# Patient Record
Sex: Male | Born: 1950 | Race: White | Hispanic: No | Marital: Married | Smoking: Never smoker
Health system: Southern US, Community
[De-identification: ages and names within clinical notes are randomized; demographics above are authoritative.]

## PROBLEM LIST (undated history)

## (undated) DIAGNOSIS — N400 Enlarged prostate without lower urinary tract symptoms: Secondary | ICD-10-CM

## (undated) HISTORY — DX: Benign prostatic hyperplasia without lower urinary tract symptoms: N40.0

---

## 2003-11-11 ENCOUNTER — Inpatient Hospital Stay (HOSPITAL_COMMUNITY): Admission: RE | Admit: 2003-11-11 | Discharge: 2003-11-13 | Payer: Self-pay | Admitting: Orthopedic Surgery

## 2003-11-23 HISTORY — PX: EYE SURGERY: SHX253

## 2005-11-05 ENCOUNTER — Ambulatory Visit: Payer: Self-pay | Admitting: Family Medicine

## 2005-11-12 ENCOUNTER — Ambulatory Visit: Payer: Self-pay | Admitting: Family Medicine

## 2005-11-16 ENCOUNTER — Ambulatory Visit: Payer: Self-pay | Admitting: Gastroenterology

## 2005-11-22 HISTORY — PX: JOINT REPLACEMENT: SHX530

## 2005-11-24 ENCOUNTER — Ambulatory Visit: Payer: Self-pay | Admitting: Gastroenterology

## 2005-11-24 HISTORY — PX: COLONOSCOPY: SHX174

## 2009-07-15 ENCOUNTER — Ambulatory Visit: Payer: Self-pay | Admitting: Family Medicine

## 2009-07-15 DIAGNOSIS — M1990 Unspecified osteoarthritis, unspecified site: Secondary | ICD-10-CM

## 2009-07-18 LAB — CONVERTED CEMR LAB
ALT: 31 units/L (ref 0–53)
AST: 32 units/L (ref 0–37)
Albumin: 4.5 g/dL (ref 3.5–5.2)
Alkaline Phosphatase: 58 units/L (ref 39–117)
BUN: 19 mg/dL (ref 6–23)
Basophils Absolute: 0 10*3/uL (ref 0.0–0.1)
Basophils Relative: 0.6 % (ref 0.0–3.0)
Bilirubin, Direct: 0 mg/dL (ref 0.0–0.3)
CO2: 31 meq/L (ref 19–32)
Calcium: 9.6 mg/dL (ref 8.4–10.5)
Chloride: 108 meq/L (ref 96–112)
Cholesterol: 210 mg/dL — ABNORMAL HIGH (ref 0–200)
Creatinine, Ser: 1.1 mg/dL (ref 0.4–1.5)
Direct LDL: 151.3 mg/dL
Eosinophils Absolute: 0 10*3/uL (ref 0.0–0.7)
Eosinophils Relative: 0.7 % (ref 0.0–5.0)
GFR calc non Af Amer: 72.93 mL/min (ref >60)
Glucose, Bld: 72 mg/dL (ref 70–99)
HCT: 43.6 % (ref 39.0–52.0)
HDL: 44.3 mg/dL (ref >39.00)
Hemoglobin: 15.5 g/dL (ref 13.0–17.0)
Lymphocytes Relative: 32.5 % (ref 12.0–46.0)
Lymphs Abs: 1.9 10*3/uL (ref 0.7–4.0)
MCHC: 35.5 g/dL (ref 30.0–36.0)
MCV: 95 fL (ref 78.0–100.0)
Monocytes Absolute: 0.4 10*3/uL (ref 0.1–1.0)
Monocytes Relative: 6.6 % (ref 3.0–12.0)
Neutro Abs: 3.7 10*3/uL (ref 1.4–7.7)
Neutrophils Relative %: 59.6 % (ref 43.0–77.0)
PSA: 3.36 ng/mL (ref 0.10–4.00)
Platelets: 218 10*3/uL (ref 150.0–400.0)
Potassium: 4.5 meq/L (ref 3.5–5.1)
RBC: 4.59 M/uL (ref 4.22–5.81)
RDW: 12.3 % (ref 11.5–14.6)
Sodium: 143 meq/L (ref 135–145)
TSH: 1.33 microintl units/mL (ref 0.35–5.50)
Total Bilirubin: 1 mg/dL (ref 0.3–1.2)
Total CHOL/HDL Ratio: 5
Total Protein: 7.3 g/dL (ref 6.0–8.3)
Triglycerides: 108 mg/dL (ref 0.0–149.0)
VLDL: 21.6 mg/dL (ref 0.0–40.0)
WBC: 6 10*3/uL (ref 4.5–10.5)

## 2010-07-20 ENCOUNTER — Encounter: Payer: Self-pay | Admitting: Family Medicine

## 2010-12-22 NOTE — Miscellaneous (Signed)
Summary: flu vacc  Clinical Lists Changes  Observations: Added new observation of FLU VAX: Fluvax 3+ (07/19/2010 12:12)      Immunization History:  Influenza Immunization History:    Influenza:  fluvax 3+ (07/19/2010)

## 2010-12-22 NOTE — Assessment & Plan Note (Signed)
Summary: NEW TO RE ESTABLISH/[facility]   Vital Signs:  Patient profile:   60 year old male Height:      69.50 inches Weight:      209 pounds BMI:     30.53 Temp:     97.8 degrees F oral Pulse rate:   80 / minute Pulse rhythm:   regular Resp:     12 per minute BP sitting:   110 / 78  (left arm) Cuff size:   large  Vitals Entered By: Sid Falcon LPN (July 15, 2009 10:28 AM) CC: New to establish,    History of Present Illness: Patient is seen to reestablish care here after not being seen approximately 4 years for complete physical. Past medical history reviewed had prior history of left hip replacement due to osteoarthritis. Followed by orthopedist. No other chronic medical problems. No other prior surgeries. He reports colonoscopy around 2006 which was normal. Date of last tetanus from 2000. He exercises fairly regularly.  Family history reviewed and significant for father having MI in his 30s. He had a brother that died of what sounds like complications of stomach cancer. No known colon cancer or prostate cancer history.  Allergies (verified): No Known Drug Allergies  Past History:  Past Medical History: Meningitis - as a child Osteoarthritis  Past Surgical History: Colonoscopy Hip replacement, left 2006 Queen Blossom, MD  Family History: Family History of Parkinson's Family History of Cardiovascular disorder father MI 52s  Review of Systems  The patient denies anorexia, fever, weight loss, weight gain, vision loss, decreased hearing, chest pain, syncope, dyspnea on exertion, peripheral edema, prolonged cough, headaches, hemoptysis, abdominal pain, melena, hematochezia, severe indigestion/heartburn, hematuria, incontinence, muscle weakness, suspicious skin lesions, depression, enlarged lymph nodes, and testicular masses.    Physical Exam  General:  Well-developed,well-nourished,in no acute distress; alert,appropriate and cooperative throughout examination Head:   Normocephalic and atraumatic without obvious abnormalities. No apparent alopecia or balding. Eyes:  No corneal or conjunctival inflammation noted. EOMI. Perrla. Funduscopic exam benign, without hemorrhages, exudates or papilledema. Vision grossly normal. Ears:  External ear exam shows no significant lesions or deformities.  Otoscopic examination reveals clear canals, tympanic membranes are intact bilaterally without bulging, retraction, inflammation or discharge. Hearing is grossly normal bilaterally. Mouth:  Oral mucosa and oropharynx without lesions or exudates.  Teeth in good repair. Neck:  No deformities, masses, or tenderness noted. Lungs:  Normal respiratory effort, chest expands symmetrically. Lungs are clear to auscultation, no crackles or wheezes. Heart:  Normal rate and regular rhythm. S1 and S2 normal without gallop, murmur, click, rub or other extra sounds. Abdomen:  Bowel sounds positive,abdomen soft and non-tender without masses, organomegaly or hernias noted. Rectal:  No external abnormalities noted. Normal sphincter tone. No rectal masses or tenderness. Prostate:  Prostate gland firm and smooth, no enlargement, nodularity, tenderness, mass, asymmetry or induration. Extremities:  No clubbing, cyanosis, edema, or deformity noted with normal full range of motion of all joints.   Neurologic:  No cranial nerve deficits noted. Station and gait are normal. Plantar reflexes are down-going bilaterally. DTRs are symmetrical throughout. Sensory, motor and coordinative functions appear intact. Skin:  Intact without suspicious lesions or rashes Cervical Nodes:  No lymphadenopathy noted Psych:  Cognition and judgment appear intact. Alert and cooperative with normal attention span and concentration. No apparent delusions, illusions, hallucinations   Impression & Recommendations:  Problem # 1:  Preventive Health Care (ICD-V70.0) Obtain screening labs. Tetanus booster will be given. Colonoscopy  obtained recently and reportedly normal.  Continue regular exercise.  Complete Medication List: 1)  No Medications   Other Orders: TLB-Lipid Panel (80061-LIPID) TLB-BMP (Basic Metabolic Panel-BMET) (80048-METABOL) TLB-CBC Platelet - w/Differential (85025-CBCD) TLB-Hepatic/Liver Function Pnl (80076-HEPATIC) TLB-TSH (Thyroid Stimulating Hormone) (84443-TSH) TLB-PSA (Prostate Specific Antigen) (84153-PSA) Tdap => 80yrs IM (56433) Admin 1st Vaccine (29518)  Patient Instructions: 1)  It is important that you exercise reguarly at least 20 minutes 5 times a week. If you develop chest pain, have severe difficulty breathing, or feel very tired, stop exercising immediately and seek medical attention.   Preventive Care Screening  Colonoscopy:    Date:  11/22/2004    Results:  normal   Last Tetanus Booster:    Date:  11/22/1998    Results:  Historical     Immunizations Administered:  Tetanus Vaccine:    Vaccine Type: Tdap    Site: left deltoid    Mfr: GlaxoSmithKline    Dose: 0.5 ml    Route: IM    Given by: Sid Falcon LPN    Exp. Date: 09/24/2009    Lot #: [id number]

## 2011-04-09 NOTE — Discharge Summary (Signed)
Troy [name]                             ACCOUNT NO.:  [id number]   MEDICAL RECORD NO.:  [id number]                   PATIENT TYPE:  INP   LOCATION:  5037                                 FACILITY:  [facility]   PHYSICIAN:  Mila Homer. Troy [name], M.D.              DATE OF BIRTH:  1951-10-21   DATE OF ADMISSION:  11/11/2003  DATE OF DISCHARGE:  11/13/2003                                 DISCHARGE SUMMARY   ADMITTING DIAGNOSIS:  Pain and osteoarthritis of the left hip.   DISCHARGE DIAGNOSIS:  Status post left total hip arthroplasty.   HISTORY OF PRESENT ILLNESS:  Troy [name] is a 60 year old white male with a  history of left hip pain for several years.  This pain has progressively  gotten worse with time with increased stiffness.  He indicates that his pain  is located deep within the groin.  This is a constant, dull, burning  sensation with occasional sharp, shooting pain with increased activity.  The  patient has developed a significant limp in the left hip due to the  stiffness and pain.  He has difficulty bending over and tying his shoes.  He  does have occasional radiation of the pain down to his left knee.  He denies  any mechanical symptoms or injury to the hip.   ALLERGIES:  No known drug allergies.   CURRENT MEDICATIONS:  Celebrex 200 mg p.o. q. day.   SURGICAL PROCEDURE:  The patient was taken to the operating room by Dr.  Mila Homer. Lucey and assisted by Jamelle Rushing, P.A on November 11, 2003.  The patient was placed under general anesthesia and a left total hip  arthroplasty was performed.  A 7 mm off set polyethylene with no neutral and  no liners was placed and 32 mm head, 13 mm fully porous coated stem and a 0  sized 32 mm cobalt-chrome head were used.  The patient tolerated the  procedure well and returned to recovery in good condition.   CONSULTS:  The following consult was obtained while the patient was  hospitalized:  PT/OT case management.   HOSPITAL COURSE:  The  patient remained afebrile and his vital signs remained  stable throughout his hospital course.  The patient progressed very well  with physical therapy.  The patient did develop acute blood loss anemia  secondary to surgery, however, he remained asymptomatic and did not require  a blood transfusion.  The patient was discharged home postoperative day two  status post left total hip arthroplasty in good stable condition.   LABORATORY DATA:  The following labs were obtained preoperatively:  CBC;  white blood count 8.3, hemoglobin 15.3, hematocrit 44.3, platelets 246.  Coags; PT 12.2, INR 0.9, PTT 25.  Routine chemistries; sodium 140, potassium  4.6, chloride 104, bicarbonate 29, glucose 90, BUN 26, creatinine 1.1.  Hepatic enzymes; AST 31, ALT 43, _______ 67, total bilirubin  0.4.  Urinalysis was negative.   AP pelvis of the left hip postoperatively showed good position and alignment  of the acetabular femoral prosthetic components.   DISCHARGE INSTRUCTIONS:  The patient was to add the following medications:  1. Lovenox 30 mg one injection daily for a total of 14 days after surgery.  2. OxyContin 10 mg SR one tablet every 12 hours as needed for pain.  3. Percocet 5 mg one to two tablets every 4 to 6 hours p.r.n. pain.   The patient was to discontinue the use of Celebrex.   ACTIVITY:  The patient will be weightbearing as tolerated with a walker.  Home health and physical therapy with [facility].   DIET:  No restrictions.   CONDITION ON DISCHARGE:  The patient was discharged home in good, stable  condition.   FOLLOW UP:  The patient needs to follow-up with Dr. Sherlean [name] in his office 12  days after discharge.  The patient is to call the office at [phone] for an  appointment.   WOUND CARE:  The patient is to keep his wound clean and dry and change  dressing daily.  He may shower after two days if no drainage from the wound.  The patient is to call the office for  temperatures greater than 100.5,  vomiting, chills, swelling, smelly drainage or pain that is not controlled  with pain medications.   SPECIAL INSTRUCTIONS:  Abduction pillow while sleeping.      Troy [name], P.A.                       Mila Homer. Troy [name], M.D.    GC/MEDQ  D:  12/23/2003  T:  12/24/2003  Job:  [id number]

## 2011-04-09 NOTE — Op Note (Signed)
Troy [name], Troy [name]                             ACCOUNT NO.:  [id number]   MEDICAL RECORD NO.:  [id number]                   PATIENT TYPE:  INP   LOCATION:  2870                                 FACILITY:  [facility]   PHYSICIAN:  Mila Homer. Sherlean Foot, M.D.              DATE OF BIRTH:  Jul 06, 1951   DATE OF PROCEDURE:  11/11/2003  DATE OF DISCHARGE:                                 OPERATIVE REPORT   PREOPERATIVE DIAGNOSIS:  Left hip osteoarthritis.   POSTOPERATIVE DIAGNOSIS:  Left hip osteoarthritis.   OPERATION PERFORMED:  Left total hip arthroplasty.   SURGEON:  Mila Homer. Sherlean Foot, M.D.   ASSISTANT:  Jamelle Rushing, P.A.   ANESTHESIA:  General.   INDICATIONS FOR PROCEDURE:  The patient is a 60 year old white male with  failure of conservative measures for osteoarthritis of the hip.  Informed  consent was obtained.   DESCRIPTION OF PROCEDURE:  The patient was laid supine on the operating  table and administered general anesthesia.  He was then placed in right down  left up lateral decubitus position.  The left hip was then prepped and  draped in the usual sterile fashion.  A minimally invasive incision was used  and made approximately five inches and length.  I used a clean 10 blade to  go down to the fascia and through the fascia.  I used a cautery to cauterize  all bleeding vessels.  I then put the Charnley retractor in place to expose  the gluteus medius and vastus lateralis. I  then elevated a single sleeve of  tissue in the anterior half of the lateralis and the anterior two thirds of  the gluteus medius and all the gluteus minimus.  I then did an anterior hip  capsulectomy and dislocated the hip.  I then used our cutting guide template  to mark out a neck cut and used a sagittal saw to make the cut.  I then went  into the flexed external rotated position with the leg in sterile pouch and  put a Hohmann retractor anterior and posterior to the acetabulum.  I then  removed the labrum  and the ligamentum teres.  I then switched sides of the  table with my physician assistant.  I then reamed sequentially up to 58 mm  and tamped in a 5 mm cup with no holes and no spikes and had excellent  stability.  This was in approximately 35 degrees of abduction and 10 degrees  of anteversion.  At this point I switched back to the posterior side of the  table.  We then gained access to the femoral canal with a canal finder.  We  had  Meyerding retractor placed to protect the abductors.  I then used the  side-biting reamer and reamed laterally into the trochanter.  I then reamed  sequentially in 0.5 mm increments up to 13 mm and then used  broaches up to  13.  I had excellent rotational stability and then I trialed with a 0 head,  7 mm offset liner and had excellent stability.  I then removed the trial  components, tamped in a real 7 mm offset polyethylene with neutral and no  liners that accepted a 32 mm head.  I then irrigated and tamped down  13 mm  fully porous coated stem and tamped on a 0 sized, 32 mm cobalt chrome head  onto a clean Morse taper.  I then located the hip and took it through  extreme range of motion and it was very, very stable.  Offset and limb  length was excellent.  I then closed the abductors to drill holes on the  trochanter, oversewed the interval with about six interrupted figure-of-  eight #2 Ethibond sutures and then irrigated again and closed  the fascia  lata with interrupted #1 Vicryl.  The deep soft tissues with interrupted 0  Vicryl and then subcuticular 2-0 Vicryl and skin staples.   COMPLICATIONS:  None.   DRAINS:  One pain catheter.   ESTIMATED BLOOD LOSS:  300 mL.                                               Mila Homer. Sherlean Foot, M.D.    SDL/MEDQ  D:  11/11/2003  T:  11/11/2003  Job:  [id number]

## 2011-04-09 NOTE — H&P (Signed)
NAME:  Troy [name], Troy [name]                             ACCOUNT NO.:  [id number]   MEDICAL RECORD NO.:  [id number]                   PATIENT TYPE:  INP   LOCATION:  NA                                   FACILITY:  [facility]   PHYSICIAN:  Mila Homer. Sherlean Foot, M.D.              DATE OF BIRTH:  [birth year]   DATE OF ADMISSION:  11/11/2003  DATE OF DISCHARGE:                                HISTORY & PHYSICAL   CHIEF COMPLAINT:  Left hip pain going on for several years.   HISTORY OF PRESENT ILLNESS:  The patient is a 60 year old white male with a  several year history of progressively worsening left hip pain and increased  stiffness.  The patient indicates that he has pain directly related deep  within the groin.  It is a constant dull, burning sensation with occasional  sharp shooting pains with increased activity.  He states that he has had  progressively worsening stiffness and a significant limp.  He has difficulty  bending over and tying his shoes.  He does have pain occasionally radiating  down to the knee.  He denies any mechanical symptoms or injury to the hip.   X-rays reveal a significant increased sclerotic femoral head with cystic  changes and irregular borders.  It is bone on bone along the lateral aspect  of the acetabulum, which is also cystic and increased sclerotic margins with  large spurs on the femoral head.   ALLERGIES:  No known drug allergies.   CURRENT MEDICATIONS:  Celebrex 200 mg p.o. daily.   PAST MEDICAL HISTORY:  The patient denies any significant medical issues.   PAST SURGICAL HISTORY:  The patient denies any previous surgery.   SOCIAL HISTORY:  The patient is a 60 year old white male healthy appearing  well-developed. He denies any history of smoking.  He does drink a couple of  beers a day.  He is currently married.  He lives with his wife in a one-  story house.  He is a retired Geneticist, molecular.   FAMILY PHYSICIAN:  Dr. Gershon Crane at [phone].   FAMILY MEDICAL  HISTORY:  Mother is deceased from cardiac arrest.  Father is  deceased from heart failure.  The patient has one brother deceased from lung  cancer.  One brother and one sister are both alive and in good medical  health.   REVIEW OF SYSTEMS:  Completely negative and noncontributory in all  categories including sensory, respiratory, cardiac, GI, GU, hematologic,  musculoskeletal, neurologic, and no mental status issues at this time.   PHYSICAL EXAMINATION:  VITAL SIGNS:  Height is 5 feet 10 inches, weight is  195 pounds.  Pulse of 76 and regular, respirations 12, blood pressure is  122/72.  The patient is afebrile.  GENERAL:  This is a healthy-appearing, well-developed physically fit  appearing white male.  He does ambulate with a slight left-sided limp.  He  is able to walk briskly.  He is able to get himself on and off the  examination table without any difficulty.  HEENT:  Head was normocephalic.  Pupils equal, round, and reactive to light  and accommodation.  Extraocular movements intact.  Sclerae is nonicteric.  Conjunctivae are pink and moist.  External ears are without deformities.  TMs pearly gray and intact.  Hearing is intact.  Nasal septum was midline.  Oral buccal mucosa was pink and moist without lesions.  Dentition was in  fair repair.  The patient is able to swallow without any difficulty.  NECK:  Supple.  No palpable lymphadenopathy.  Thyroid region was nontender.  He had excellent range of motion or his cervical spine without any  difficulties or tenderness.  He was nontender to percussion along the entire  spinal column.  CHEST:  Lung sounds were clear and equal bilaterally.  No wheezes, rales,  rhonchi, or rubs noted.  HEART:  Regular rate and rhythm.  S1 and S2 was auscultated.  No murmurs,  rubs, or gallops noted.  ABDOMEN:  Soft, nontender.  Bowel sounds were normoactive throughout.  No  hepatosplenomegaly.  CVA region was nontender.  EXTREMITIES:  Upper  extremities were symmetric in size and shape.  He has  excellent range of motion of his shoulders as well as his wrists.  Motor  strength was 5/5.  Lower extremities, right hip had full extension.  Flexion  up to 130 degrees with about 15 degrees internal rotation and 20 degrees  external rotation.  Left hip had full extension and flexion up to 90  degrees, limited by mechanical and pain.  He had 0 degrees internal rotation  limited by mechanical and pain and about 10 degrees external rotation,  limited by pain.  Bilateral knees were symmetrical.  No signs of erythema or  ecchymosis.  No effusion.  Full extension.  Flexion back to 130 degrees.  No  instability.  The calves are nontender.  Ankles are symmetric with good  dorsi-plantar flexion.  PERIPHERAL VASCULATURE:  Carotid pulses are 2+ and no bruits.  Radial pulses  2+.  Dorsalis pedis pulses and posterior tibial pulses were 1+.  He had no  significant lower extremity edema or venous stasis changes.  NEUROLOGIC:  The patient was conscious, alert, and appropriate.  Held an  easy conversation with the examiner.  Cranial nerves II-XII were grossly  intact.  Deep tendon reflexes of the upper and lower extremity were brisk 2+  and symmetrical right to left.  He was grossly intact to light touch and  sensation.  He had no gross neurologic defects noted.  BREASTS/RECTAL/GENITOURINARY:  Deferred at this time.   IMPRESSION:  Advanced osteoarthritis or the left hip.   PLAN:  The patient will be admitted to [facility] under the care of  Dr. Georgena Spurling on November 11, 2003 for a planned left total hip  arthroplasty.  The patient will undergo all routine labs and tests prior to  having this procedure completed.      Jamelle Rushing, P.A.                      Mila Homer. Sherlean Foot, M.D.    RWK/MEDQ  D:  11/05/2003  T:  11/05/2003  Job:  [id number]

## 2012-05-31 DIAGNOSIS — H02839 Dermatochalasis of unspecified eye, unspecified eyelid: Secondary | ICD-10-CM | POA: Insufficient documentation

## 2012-05-31 DIAGNOSIS — H02409 Unspecified ptosis of unspecified eyelid: Secondary | ICD-10-CM | POA: Insufficient documentation

## 2014-08-20 ENCOUNTER — Encounter: Payer: Self-pay | Admitting: Internal Medicine

## 2014-08-20 ENCOUNTER — Ambulatory Visit (INDEPENDENT_AMBULATORY_CARE_PROVIDER_SITE_OTHER): Payer: BC Managed Care – PPO | Admitting: Internal Medicine

## 2014-08-20 VITALS — BP 136/78 | HR 64 | Temp 97.9°F | Resp 16 | Ht 70.0 in | Wt 209.0 lb

## 2014-08-20 DIAGNOSIS — Z111 Encounter for screening for respiratory tuberculosis: Secondary | ICD-10-CM

## 2014-08-20 DIAGNOSIS — R7309 Other abnormal glucose: Secondary | ICD-10-CM | POA: Insufficient documentation

## 2014-08-20 DIAGNOSIS — R7402 Elevation of levels of lactic acid dehydrogenase (LDH): Secondary | ICD-10-CM

## 2014-08-20 DIAGNOSIS — R740 Nonspecific elevation of levels of transaminase and lactic acid dehydrogenase [LDH]: Secondary | ICD-10-CM

## 2014-08-20 DIAGNOSIS — I10 Essential (primary) hypertension: Secondary | ICD-10-CM

## 2014-08-20 DIAGNOSIS — E559 Vitamin D deficiency, unspecified: Secondary | ICD-10-CM | POA: Insufficient documentation

## 2014-08-20 DIAGNOSIS — Z125 Encounter for screening for malignant neoplasm of prostate: Secondary | ICD-10-CM

## 2014-08-20 DIAGNOSIS — Z1212 Encounter for screening for malignant neoplasm of rectum: Secondary | ICD-10-CM

## 2014-08-20 DIAGNOSIS — Z23 Encounter for immunization: Secondary | ICD-10-CM

## 2014-08-20 DIAGNOSIS — E782 Mixed hyperlipidemia: Secondary | ICD-10-CM

## 2014-08-20 DIAGNOSIS — Z8249 Family history of ischemic heart disease and other diseases of the circulatory system: Secondary | ICD-10-CM

## 2014-08-20 DIAGNOSIS — Z0000 Encounter for general adult medical examination without abnormal findings: Secondary | ICD-10-CM

## 2014-08-20 DIAGNOSIS — Z113 Encounter for screening for infections with a predominantly sexual mode of transmission: Secondary | ICD-10-CM

## 2014-08-20 DIAGNOSIS — E785 Hyperlipidemia, unspecified: Secondary | ICD-10-CM | POA: Insufficient documentation

## 2014-08-20 LAB — CBC WITH DIFFERENTIAL/PLATELET
Basophils Absolute: 0 10*3/uL (ref 0.0–0.1)
Basophils Relative: 0 % (ref 0–1)
Eosinophils Absolute: 0 10*3/uL (ref 0.0–0.7)
Eosinophils Relative: 1 % (ref 0–5)
HCT: 43.1 % (ref 39.0–52.0)
Hemoglobin: 15.1 g/dL (ref 13.0–17.0)
Lymphocytes Relative: 36 % (ref 12–46)
Lymphs Abs: 1.7 10*3/uL (ref 0.7–4.0)
MCH: 32.2 pg (ref 26.0–34.0)
MCHC: 35 g/dL (ref 30.0–36.0)
MCV: 91.9 fL (ref 78.0–100.0)
Monocytes Absolute: 0.4 10*3/uL (ref 0.1–1.0)
Monocytes Relative: 8 % (ref 3–12)
Neutro Abs: 2.5 10*3/uL (ref 1.7–7.7)
Neutrophils Relative %: 55 % (ref 43–77)
Platelets: 237 10*3/uL (ref 150–400)
RBC: 4.69 MIL/uL (ref 4.22–5.81)
RDW: 13.4 % (ref 11.5–15.5)
WBC: 4.6 10*3/uL (ref 4.0–10.5)

## 2014-08-20 LAB — HEMOGLOBIN A1C
Hgb A1c MFr Bld: 5.4 % (ref <5.7)
Mean Plasma Glucose: 108 mg/dL (ref <117)

## 2014-08-20 NOTE — Patient Instructions (Signed)
Recommend the book "The END of DIETING" by Dr Baker Janus   and the book "The END of DIABETES " by Dr Excell Seltzer  At [email or web address] - get book & Audio CD's      Being diabetic has a  300% increased risk for heart attack, stroke, cancer, and alzheimer- type vascular dementia. It is very important that you work harder with diet by avoiding all foods that are white except chicken & fish. Avoid white rice (brown & wild rice is OK), white potatoes (sweetpotatoes in moderation is OK), White bread or wheat bread or anything made out of white flour like bagels, donuts, rolls, buns, biscuits, cakes, pastries, cookies, pizza crust, and pasta (made from white flour & egg whites) - vegetarian pasta or spinach or wheat pasta is OK. Multigrain breads like Arnold's or [employer], or multigrain sandwich thins or flatbreads.  Diet, exercise and weight loss can reverse and cure diabetes in the early stages.  Diet, exercise and weight loss is very important in the control and prevention of complications of diabetes which affects every system in your body, ie. Brain - dementia/stroke, eyes - glaucoma/blindness, heart - heart attack/heart failure, kidneys - dialysis, stomach - gastric paralysis, intestines - malabsorption, nerves - severe painful neuritis, circulation - gangrene & loss of a leg(s), and finally cancer and Alzheimers.    I recommend avoid fried & greasy foods,  sweets/candy, white rice (brown or wild rice or Quinoa is OK), white potatoes (sweet potatoes are OK) - anything made from white flour - bagels, doughnuts, rolls, buns, biscuits,white and wheat breads, pizza crust and traditional pasta made of white flour & egg white(vegetarian pasta or spinach or wheat pasta is OK).  Multi-grain bread is OK - like multi-grain flat bread or sandwich thins. Avoid alcohol in excess. Exercise is also important.    Eat all the vegetables you want - avoid meat, especially red meat and dairy - especially cheese.  Cheese  is the most concentrated form of trans-fats which is the worst thing to clog up our arteries. Veggie cheese is OK which can be found in the fresh produce section at [employer] or [employer] or [employer]  Preventive Care for Adults A healthy lifestyle and preventive care can promote health and wellness. Preventive health guidelines for men include the following key practices:  A routine yearly physical is a good way to check with your health care provider about your health and preventative screening. It is a chance to share any concerns and updates on your health and to receive a thorough exam.  Visit your dentist for a routine exam and preventative care every 6 months. Brush your teeth twice a day and floss once a day. Good oral hygiene prevents tooth decay and gum disease.  The frequency of eye exams is based on your age, health, family medical history, use of contact lenses, and other factors. Follow your health care provider's recommendations for frequency of eye exams.  Eat a healthy diet. Foods such as vegetables, fruits, whole grains, low-fat dairy products, and lean protein foods contain the nutrients you need without too many calories. Decrease your intake of foods high in solid fats, added sugars, and salt. Eat the right amount of calories for you.Get information about a proper diet from your health care provider, if necessary.  Regular physical exercise is one of the most important things you can do for your health. Most adults should get at least 150 minutes of moderate-intensity exercise (any activity that  increases your heart rate and causes you to sweat) each week. In addition, most adults need muscle-strengthening exercises on 2 or more days a week.  Maintain a healthy weight. The body mass index (BMI) is a screening tool to identify possible weight problems. It provides an estimate of body fat based on height and weight. Your health care provider can find your BMI and can help you  achieve or maintain a healthy weight.For adults 20 years and older:  A BMI below 18.5 is considered underweight.  A BMI of 18.5 to 24.9 is normal.  A BMI of 25 to 29.9 is considered overweight.  A BMI of 30 and above is considered obese.  Maintain normal blood lipids and cholesterol levels by exercising and minimizing your intake of saturated fat. Eat a balanced diet with plenty of fruit and vegetables. Blood tests for lipids and cholesterol should begin at age 20 and be repeated every 5 years. If your lipid or cholesterol levels are high, you are over 50, or you are at high risk for heart disease, you may need your cholesterol levels checked more frequently.Ongoing high lipid and cholesterol levels should be treated with medicines if diet and exercise are not working.  If you smoke, find out from your health care provider how to quit. If you do not use tobacco, do not start.  Lung cancer screening is recommended for adults aged 72-80 years who are at high risk for developing lung cancer because of a history of smoking. A yearly low-dose CT scan of the lungs is recommended for people who have at least a 30-pack-year history of smoking and are a current smoker or have quit within the past 15 years. A pack year of smoking is smoking an average of 1 pack of cigarettes a day for 1 year (for example: 1 pack a day for 30 years or 2 packs a day for 15 years). Yearly screening should continue until the smoker has stopped smoking for at least 15 years. Yearly screening should be stopped for people who develop a health problem that would prevent them from having lung cancer treatment.  If you choose to drink alcohol, do not have more than 2 drinks per day. One drink is considered to be 12 ounces (355 mL) of beer, 5 ounces (148 mL) of wine, or 1.5 ounces (44 mL) of liquor.  Avoid use of street drugs. Do not share needles with anyone. Ask for help if you need support or instructions about stopping the use of  drugs.  High blood pressure causes heart disease and increases the risk of stroke. Your blood pressure should be checked at least every 1-2 years. Ongoing high blood pressure should be treated with medicines, if weight loss and exercise are not effective.  If you are 28-64 years old, ask your health care provider if you should take aspirin to prevent heart disease.  Diabetes screening involves taking a blood sample to check your fasting blood sugar level. This should be done once every 3 years, after age 13, if you are within normal weight and without risk factors for diabetes. Testing should be considered at a younger age or be carried out more frequently if you are overweight and have at least 1 risk factor for diabetes.  Colorectal cancer can be detected and often prevented. Most routine colorectal cancer screening begins at the age of 78 and continues through age 56. However, your health care provider may recommend screening at an earlier age if you have risk  factors for colon cancer. On a yearly basis, your health care provider may provide home test kits to check for hidden blood in the stool. Use of a small camera at the end of a tube to directly examine the colon (sigmoidoscopy or colonoscopy) can detect the earliest forms of colorectal cancer. Talk to your health care provider about this at age 48, when routine screening begins. Direct exam of the colon should be repeated every 5-10 years through age 60, unless early forms of precancerous polyps or small growths are found.  People who are at an increased risk for hepatitis B should be screened for this virus. You are considered at high risk for hepatitis B if:  You were born in a country where hepatitis B occurs often. Talk with your health care provider about which countries are considered high risk.  Your parents were born in a high-risk country and you have not received a shot to protect against hepatitis B (hepatitis B vaccine).  You have  HIV or AIDS.  You use needles to inject street drugs.  You live with, or have sex with, someone who has hepatitis B.  You are a man who has sex with other men (MSM).  You get hemodialysis treatment.  You take certain medicines for conditions such as cancer, organ transplantation, and autoimmune conditions.  Hepatitis C blood testing is recommended for all people born from [birth year] through 1965 and any individual with known risks for hepatitis C.  Practice safe sex. Use condoms and avoid high-risk sexual practices to reduce the spread of sexually transmitted infections (STIs). STIs include gonorrhea, chlamydia, syphilis, trichomonas, herpes, HPV, and human immunodeficiency virus (HIV). Herpes, HIV, and HPV are viral illnesses that have no cure. They can result in disability, cancer, and death.  If you are at risk of being infected with HIV, it is recommended that you take a prescription medicine daily to prevent HIV infection. This is called preexposure prophylaxis (PrEP). You are considered at risk if:  You are a man who has sex with other men (MSM) and have other risk factors.  You are a heterosexual man, are sexually active, and are at increased risk for HIV infection.  You take drugs by injection.  You are sexually active with a partner who has HIV.  Talk with your health care provider about whether you are at high risk of being infected with HIV. If you choose to begin PrEP, you should first be tested for HIV. You should then be tested every 3 months for as long as you are taking PrEP.  A one-time screening for abdominal aortic aneurysm (AAA) and surgical repair of large AAAs by ultrasound are recommended for men ages 51 to 11 years who are current or former smokers.  Healthy men should no longer receive prostate-specific antigen (PSA) blood tests as part of routine cancer screening. Talk with your health care provider about prostate cancer screening.  Testicular cancer screening is  not recommended for adult males who have no symptoms. Screening includes self-exam, a health care provider exam, and other screening tests. Consult with your health care provider about any symptoms you have or any concerns you have about testicular cancer.  Use sunscreen. Apply sunscreen liberally and repeatedly throughout the day. You should seek shade when your shadow is shorter than you. Protect yourself by wearing long sleeves, pants, a wide-brimmed hat, and sunglasses year round, whenever you are outdoors.  Once a month, do a whole-body skin exam, using a mirror to look  at the skin on your back. Tell your health care provider about new moles, moles that have irregular borders, moles that are larger than a pencil eraser, or moles that have changed in shape or color.  Stay current with required vaccines (immunizations).  Influenza vaccine. All adults should be immunized every year.  Tetanus, diphtheria, and acellular pertussis (Td, Tdap) vaccine. An adult who has not previously received Tdap or who does not know his vaccine status should receive 1 dose of Tdap. This initial dose should be followed by tetanus and diphtheria toxoids (Td) booster doses every 10 years. Adults with an unknown or incomplete history of completing a 3-dose immunization series with Td-containing vaccines should begin or complete a primary immunization series including a Tdap dose. Adults should receive a Td booster every 10 years.  Varicella vaccine. An adult without evidence of immunity to varicella should receive 2 doses or a second dose if he has previously received 1 dose.  Human papillomavirus (HPV) vaccine. Males aged 29-21 years who have not received the vaccine previously should receive the 3-dose series. Males aged 22-26 years may be immunized. Immunization is recommended through the age of 26 years for any male who has sex with males and did not get any or all doses earlier. Immunization is recommended for any  person with an immunocompromised condition through the age of 29 years if he did not get any or all doses earlier. During the 3-dose series, the second dose should be obtained 4-8 weeks after the first dose. The third dose should be obtained 24 weeks after the first dose and 16 weeks after the second dose.  Zoster vaccine. One dose is recommended for adults aged 38 years or older unless certain conditions are present.  Measles, mumps, and rubella (MMR) vaccine. Adults born before [birth year] generally are considered immune to measles and mumps. Adults born in [birth year] or later should have 1 or more doses of MMR vaccine unless there is a contraindication to the vaccine or there is laboratory evidence of immunity to each of the three diseases. A routine second dose of MMR vaccine should be obtained at least 28 days after the first dose for students attending postsecondary schools, health care workers, or international travelers. People who received inactivated measles vaccine or an unknown type of measles vaccine during 1963-1967 should receive 2 doses of MMR vaccine. People who received inactivated mumps vaccine or an unknown type of mumps vaccine before 1979 and are at high risk for mumps infection should consider immunization with 2 doses of MMR vaccine. Unvaccinated health care workers born before [birth year] who lack laboratory evidence of measles, mumps, or rubella immunity or laboratory confirmation of disease should consider measles and mumps immunization with 2 doses of MMR vaccine or rubella immunization with 1 dose of MMR vaccine.  Pneumococcal 13-valent conjugate (PCV13) vaccine. When indicated, a person who is uncertain of his immunization history and has no record of immunization should receive the PCV13 vaccine. An adult aged 68 years or older who has certain medical conditions and has not been previously immunized should receive 1 dose of PCV13 vaccine. This PCV13 should be followed with a dose of pneumococcal  polysaccharide (PPSV23) vaccine. The PPSV23 vaccine dose should be obtained at least 8 weeks after the dose of PCV13 vaccine. An adult aged 95 years or older who has certain medical conditions and previously received 1 or more doses of PPSV23 vaccine should receive 1 dose of PCV13. The PCV13 vaccine dose should be obtained 1  or more years after the last PPSV23 vaccine dose.  Pneumococcal polysaccharide (PPSV23) vaccine. When PCV13 is also indicated, PCV13 should be obtained first. All adults aged 65 years and older should be immunized. An adult younger than age 65 years who has certain medical conditions should be immunized. Any person who resides in a nursing home or long-term care facility should be immunized. An adult smoker should be immunized. People with an immunocompromised condition and certain other conditions should receive both PCV13 and PPSV23 vaccines. People with human immunodeficiency virus (HIV) infection should be immunized as soon as possible after diagnosis. Immunization during chemotherapy or radiation therapy should be avoided. Routine use of PPSV23 vaccine is not recommended for American Indians, [place] Natives, or people younger than 65 years unless there are medical conditions that require PPSV23 vaccine. When indicated, people who have unknown immunization and have no record of immunization should receive PPSV23 vaccine. One-time revaccination 5 years after the first dose of PPSV23 is recommended for people aged 19-64 years who have chronic kidney failure, nephrotic syndrome, asplenia, or immunocompromised conditions. People who received 1-2 doses of PPSV23 before age 65 years should receive another dose of PPSV23 vaccine at age 65 years or later if at least 5 years have passed since the previous dose. Doses of PPSV23 are not needed for people immunized with PPSV23 at or after age 65 years.  Meningococcal vaccine. Adults with asplenia or persistent complement component deficiencies  should receive 2 doses of quadrivalent meningococcal conjugate (MenACWY-D) vaccine. The doses should be obtained at least 2 months apart. Microbiologists working with certain meningococcal bacteria, military recruits, people at risk during an outbreak, and people who travel to or live in countries with a high rate of meningitis should be immunized. A first-year college student up through age 21 years who is living in a residence hall should receive a dose if he did not receive a dose on or after his 16th birthday. Adults who have certain high-risk conditions should receive one or more doses of vaccine.  Hepatitis A vaccine. Adults who wish to be protected from this disease, have certain high-risk conditions, work with hepatitis A-infected animals, work in hepatitis A research labs, or travel to or work in countries with a high rate of hepatitis A should be immunized. Adults who were previously unvaccinated and who anticipate close contact with an international adoptee during the first 60 days after arrival in the [place] from a country with a high rate of hepatitis A should be immunized.  Hepatitis B vaccine. Adults should be immunized if they wish to be protected from this disease, have certain high-risk conditions, may be exposed to blood or other infectious body fluids, are household contacts or sex partners of hepatitis B positive people, are clients or workers in certain care facilities, or travel to or work in countries with a high rate of hepatitis B.  Haemophilus influenzae type b (Hib) vaccine. A previously unvaccinated person with asplenia or sickle cell disease or having a scheduled splenectomy should receive 1 dose of Hib vaccine. Regardless of previous immunization, a recipient of a hematopoietic stem cell transplant should receive a 3-dose series 6-12 months after his successful transplant. Hib vaccine is not recommended for adults with HIV infection. Preventive Service / Frequency Ages  40 to 64  Blood pressure check.** / Every 1 to 2 years.  Lipid and cholesterol check.** / Every 5 years beginning at age 20.  Lung cancer screening. / Every year if you are aged 55-80   aged 36-80 years and have a 30-pack-year history of smoking and currently smoke or have quit within the past 15 years. Yearly screening is stopped once you have quit smoking for at least 15 years or develop a health problem that would prevent you from having lung cancer treatment.  Fecal occult blood test (FOBT) of stool. / Every year beginning at age 27 and continuing until age 29. You may not have to do this test if you get a colonoscopy every 10 years.  Flexible sigmoidoscopy** or colonoscopy.** / Every 5 years for a flexible sigmoidoscopy or every 10 years for a colonoscopy beginning at age 79 and continuing until age 28.  Abdominal aortic aneurysm (AAA) screening for persons with hypertension, elevated blood pressure, family history ofhypertension or cardiovascular disease or who are current or former smokers.  Hepatitis C blood test.** / For all people born from [birth year] through 1965 and any individual with known risks for hepatitis C.  Skin self-exam. / Monthly.  Influenza vaccine. / Every year.  Tetanus, diphtheria, and acellular pertussis (Tdap/Td) vaccine.** / Consult your health care provider. 1 dose of Td every 10 years.  Varicella vaccine.** / Consult your health care provider.  Zoster vaccine.** / 1 dose for adults aged 29 years or older.  Measles, mumps, rubella (MMR) vaccine.** / You need at least 1 dose of MMR if you were born in 1957 or later. You may also need a second dose.  Pneumococcal 13-valent conjugate (PCV13) vaccine.** / Consult your health care provider.  Pneumococcal polysaccharide (PPSV23) vaccine.** / 1 to 2 doses if you smoke cigarettes or if you have certain conditions.  Meningococcal vaccine.** / Consult your health care provider.  Hepatitis A vaccine.** / Consult  your health care provider.  Hepatitis B vaccine.** / Consult your health care provider.  Haemophilus influenzae type b (Hib) vaccine.** / Consult your health care provider.

## 2014-08-20 NOTE — Progress Notes (Signed)
Patient ID: Troy [name], male   DOB: August 25, 1951, 63 y.o.   MRN: [id number]  Annual Screening/Preventative  Comprehensive Examination  This very nice 63 y.o.MWM presents for complete physical.  Patient has been followed for HTN,  Prediabetes, Hyperlipidemia, and Vitamin D Deficiency.   Pqatient has hx/o labile mildly elevated BP's. Patient's BP has been controlled at home.Today's BP: 136/78 mmHg. Patient denies any cardiac symptoms as chest pain, palpitations, shortness of breath, dizziness or ankle swelling.   Patient's hyperlipidemia is controlled with diet and medications. Patient denies myalgias or other medication SE's. Last lipids were TC 217/TG 208/HDL 42/ LDL 133.   Patient has been screened for prediabetes  and he denies reactive hypoglycemic symptoms, visual blurring, diabetic polys or paresthesias. Last A1c was 5.3% in Sept 2014.     Finally, patient has history of Vitamin D Deficiency and last vitamin D was 80 in Sept 2014.   Meds - MultiVit  No Known Allergies  PMHx - (+) labile HTN, HLD, Vit D Def, Hx DJD  No past surgical history on file. No family history on file.  History   Social History  . Marital Status: Married    Spouse Name: N/A    Number of Children: N/A  . Years of Education: N/A   Occupational History  . Retired Therapist, sports   Social History Main Topics  . Smoking status: Never Smoker   . Smokeless tobacco: Not on file  . Alcohol Use: No  . Drug Use: Not on file  . Sexual Activity: Not on file    ROS Constitutional: Denies fever, chills, weight loss/gain, headaches, insomnia, fatigue, night sweats or change in appetite. Eyes: Denies redness, blurred vision, diplopia, discharge, itchy or watery eyes.  ENT: Denies discharge, congestion, post nasal drip, epistaxis, sore throat, earache, hearing loss, dental pain, Tinnitus, Vertigo, Sinus pain or snoring.  Cardio: Denies chest pain, palpitations, irregular heartbeat, syncope, dyspnea,  diaphoresis, orthopnea, PND, claudication or edema Respiratory: denies cough, dyspnea, DOE, pleurisy, hoarseness, laryngitis or wheezing.  Gastrointestinal: Denies dysphagia, heartburn, reflux, water brash, pain, cramps, nausea, vomiting, bloating, diarrhea, constipation, hematemesis, melena, hematochezia, jaundice or hemorrhoids Genitourinary: Denies dysuria, frequency, urgency, nocturia, hesitancy, discharge, hematuria or flank pain Musculoskeletal: Denies arthralgia, myalgia, stiffness, Jt. Swelling, pain, limp or strain/sprain. Denies Falls. Skin: Denies puritis, rash, hives, warts, acne, eczema or change in skin lesion Neuro: No weakness, tremor, incoordination, spasms, paresthesia or pain Psychiatric: Denies confusion, memory loss or sensory loss. Denies Depression. Endocrine: Denies change in weight, skin, hair change, nocturia, and paresthesia, diabetic polys, visual blurring or hyper / hypo glycemic episodes.  Heme/Lymph: No excessive bleeding, bruising or enlarged lymph nodes.  Physical Exam  BP 136/78  Pulse 64  Temp(Src) 97.9 F (36.6 C) (Temporal)  Resp 16  Ht 5\' 10"  (1.778 m)  Wt 209 lb (94.802 kg)  BMI 29.99 kg/m2  General Appearance: Well nourished, in no apparent distress. Eyes: PERRLA, EOMs, conjunctiva no swelling or erythema, normal fundi and vessels. Sinuses: No frontal/maxillary tenderness ENT/Mouth: EACs patent / TMs  nl. Nares clear without erythema, swelling, mucoid exudates. Oral hygiene is good. No erythema, swelling, or exudate. Tongue normal, non-obstructing. Tonsils not swollen or erythematous. Hearing normal.  Neck: Supple, thyroid normal. No bruits, nodes or JVD. Respiratory: Respiratory effort normal.  BS equal and clear bilateral without rales, rhonci, wheezing or stridor. Cardio: Heart sounds are normal with regular rate and rhythm and no murmurs, rubs or gallops. Peripheral pulses are normal and equal bilaterally without edema. No  aortic or femoral  bruits. Chest: symmetric with normal excursions and percussion.  Abdomen: Flat, soft, with bowl sounds. Nontender, no guarding, rebound, hernias, masses, or organomegaly.  Lymphatics: Non tender without lymphadenopathy.  Genitourinary: No hernias.Testes nl. DRE - prostate nl for age - smooth & firm w/o nodules. Musculoskeletal: Full ROM all peripheral extremities, joint stability, 5/5 strength, and normal gait. Skin: Warm and dry without rashes, lesions, cyanosis, clubbing or  ecchymosis.  Neuro: Cranial nerves intact, reflexes equal bilaterally. Normal muscle tone, no cerebellar symptoms. Sensation intact.  Pysch: Awake and oriented X 3with normal affect, insight and judgment appropriate.  Assessment and Plan  1. Annual Screening Examination 2. Hypertension, labile 3. Hyperlipidemia 4. Pre Diabetes, screening 5. Vitamin D Deficiency   Continue prudent diet as discussed, weight control, BP monitoring, regular exercise, and medications as discussed.  Discussed med effects and SE's. Routine screening labs and tests as requested with regular follow-up as recommended.

## 2014-08-21 LAB — HEPATITIS B CORE ANTIBODY, TOTAL: Hep B Core Total Ab: NONREACTIVE

## 2014-08-21 LAB — BASIC METABOLIC PANEL WITH GFR
BUN: 18 mg/dL (ref 6–23)
CO2: 29 mEq/L (ref 19–32)
Calcium: 9.9 mg/dL (ref 8.4–10.5)
Chloride: 104 mEq/L (ref 96–112)
Creat: 1 mg/dL (ref 0.50–1.35)
GFR, Est African American: >89 mL/min
GFR, Est Non African American: 80 mL/min
Glucose, Bld: 92 mg/dL (ref 70–99)
Potassium: 4.6 mEq/L (ref 3.5–5.3)
Sodium: 140 mEq/L (ref 135–145)

## 2014-08-21 LAB — URINALYSIS, MICROSCOPIC ONLY
Bacteria, UA: NONE SEEN
Casts: NONE SEEN
Crystals: NONE SEEN
Squamous Epithelial / HPF: NONE SEEN

## 2014-08-21 LAB — HEPATIC FUNCTION PANEL
ALT: 28 U/L (ref 0–53)
AST: 24 U/L (ref 0–37)
Albumin: 4.5 g/dL (ref 3.5–5.2)
Alkaline Phosphatase: 62 U/L (ref 39–117)
Bilirubin, Direct: 0.1 mg/dL (ref 0.0–0.3)
Indirect Bilirubin: 0.3 mg/dL (ref 0.2–1.2)
Total Bilirubin: 0.4 mg/dL (ref 0.2–1.2)
Total Protein: 7 g/dL (ref 6.0–8.3)

## 2014-08-21 LAB — MAGNESIUM: Magnesium: 2 mg/dL (ref 1.5–2.5)

## 2014-08-21 LAB — LIPID PANEL
Cholesterol: 191 mg/dL (ref 0–200)
HDL: 42 mg/dL (ref >39)
LDL Cholesterol: 96 mg/dL (ref 0–99)
Total CHOL/HDL Ratio: 4.5 Ratio
Triglycerides: 264 mg/dL — ABNORMAL HIGH (ref <150)
VLDL: 53 mg/dL — ABNORMAL HIGH (ref 0–40)

## 2014-08-21 LAB — HEPATITIS B SURFACE ANTIBODY,QUALITATIVE: Hep B S Ab: POSITIVE — AB

## 2014-08-21 LAB — VITAMIN D 25 HYDROXY (VIT D DEFICIENCY, FRACTURES): Vit D, 25-Hydroxy: 72 ng/mL (ref 30–89)

## 2014-08-21 LAB — INSULIN, FASTING: Insulin fasting, serum: 103.6 u[IU]/mL — ABNORMAL HIGH (ref 2.0–19.6)

## 2014-08-21 LAB — TSH: TSH: 2.281 u[IU]/mL (ref 0.350–4.500)

## 2014-08-21 LAB — MICROALBUMIN / CREATININE URINE RATIO
Creatinine, Urine: 230.7 mg/dL
Microalb Creat Ratio: 6.1 mg/g (ref 0.0–30.0)
Microalb, Ur: 1.4 mg/dL (ref <2.0)

## 2014-08-21 LAB — HEPATITIS A ANTIBODY, TOTAL: Hep A Total Ab: NONREACTIVE

## 2014-08-21 LAB — RPR

## 2014-08-21 LAB — VITAMIN B12: Vitamin B-12: 535 pg/mL (ref 211–911)

## 2014-08-21 LAB — TESTOSTERONE: Testosterone: 351 ng/dL (ref 300–890)

## 2014-08-21 LAB — HIV ANTIBODY (ROUTINE TESTING W REFLEX): HIV 1&2 Ab, 4th Generation: NONREACTIVE

## 2014-08-21 LAB — HEPATITIS C ANTIBODY: HCV Ab: NEGATIVE

## 2014-08-22 LAB — HEPATITIS B E ANTIBODY: Hepatitis Be Antibody: NONREACTIVE

## 2014-08-23 LAB — TB SKIN TEST
Induration: 0 mm
TB Skin Test: NEGATIVE

## 2015-07-23 ENCOUNTER — Other Ambulatory Visit (HOSPITAL_COMMUNITY): Payer: Self-pay | Admitting: Urology

## 2015-07-23 DIAGNOSIS — R972 Elevated prostate specific antigen [PSA]: Secondary | ICD-10-CM

## 2015-08-11 ENCOUNTER — Ambulatory Visit (HOSPITAL_COMMUNITY)
Admission: RE | Admit: 2015-08-11 | Discharge: 2015-08-11 | Disposition: A | Discharge status: Home / Self Care | Payer: BC Managed Care – PPO | Source: Ambulatory Visit | Attending: Urology | Admitting: Urology

## 2015-08-11 DIAGNOSIS — R972 Elevated prostate specific antigen [PSA]: Secondary | ICD-10-CM | POA: Insufficient documentation

## 2015-08-11 LAB — POCT I-STAT CREATININE: Creatinine, Ser: 1.1 mg/dL (ref 0.61–1.24)

## 2015-08-11 MED ORDER — GADOBENATE DIMEGLUMINE 529 MG/ML IV SOLN
20.0000 mL | Freq: Once | INTRAVENOUS | Status: AC | PRN
Start: 2015-08-11 — End: 2015-08-11
  Administered 2015-08-11: 20 mL via INTRAVENOUS

## 2015-08-19 ENCOUNTER — Encounter: Payer: Self-pay | Admitting: Occupational Therapy

## 2015-08-25 ENCOUNTER — Ambulatory Visit (INDEPENDENT_AMBULATORY_CARE_PROVIDER_SITE_OTHER): Payer: BC Managed Care – PPO | Admitting: Internal Medicine

## 2015-08-25 ENCOUNTER — Encounter: Payer: Self-pay | Admitting: Internal Medicine

## 2015-08-25 VITALS — BP 126/82 | HR 64 | Temp 97.4°F | Resp 16 | Ht 69.0 in | Wt 207.8 lb

## 2015-08-25 DIAGNOSIS — Z79899 Other long term (current) drug therapy: Secondary | ICD-10-CM

## 2015-08-25 DIAGNOSIS — Z0000 Encounter for general adult medical examination without abnormal findings: Secondary | ICD-10-CM | POA: Diagnosis not present

## 2015-08-25 DIAGNOSIS — IMO0001 Reserved for inherently not codable concepts without codable children: Secondary | ICD-10-CM

## 2015-08-25 DIAGNOSIS — Z23 Encounter for immunization: Secondary | ICD-10-CM | POA: Diagnosis not present

## 2015-08-25 DIAGNOSIS — Z1212 Encounter for screening for malignant neoplasm of rectum: Secondary | ICD-10-CM

## 2015-08-25 DIAGNOSIS — Z111 Encounter for screening for respiratory tuberculosis: Secondary | ICD-10-CM | POA: Diagnosis not present

## 2015-08-25 DIAGNOSIS — Z0001 Encounter for general adult medical examination with abnormal findings: Secondary | ICD-10-CM

## 2015-08-25 DIAGNOSIS — R030 Elevated blood-pressure reading, without diagnosis of hypertension: Secondary | ICD-10-CM

## 2015-08-25 DIAGNOSIS — R7309 Other abnormal glucose: Secondary | ICD-10-CM

## 2015-08-25 DIAGNOSIS — R5383 Other fatigue: Secondary | ICD-10-CM

## 2015-08-25 DIAGNOSIS — E7800 Pure hypercholesterolemia, unspecified: Secondary | ICD-10-CM

## 2015-08-25 DIAGNOSIS — Z125 Encounter for screening for malignant neoplasm of prostate: Secondary | ICD-10-CM | POA: Diagnosis not present

## 2015-08-25 DIAGNOSIS — E559 Vitamin D deficiency, unspecified: Secondary | ICD-10-CM | POA: Diagnosis not present

## 2015-08-25 DIAGNOSIS — Z6830 Body mass index (BMI) 30.0-30.9, adult: Secondary | ICD-10-CM

## 2015-08-25 DIAGNOSIS — R972 Elevated prostate specific antigen [PSA]: Secondary | ICD-10-CM | POA: Insufficient documentation

## 2015-08-25 DIAGNOSIS — I159 Secondary hypertension, unspecified: Secondary | ICD-10-CM | POA: Insufficient documentation

## 2015-08-25 LAB — CBC WITH DIFFERENTIAL/PLATELET
BASOS PCT: 0 % (ref 0–1)
Basophils Absolute: 0 10*3/uL (ref 0.0–0.1)
EOS ABS: 0.1 10*3/uL (ref 0.0–0.7)
Eosinophils Relative: 1 % (ref 0–5)
HCT: 44.9 % (ref 39.0–52.0)
Hemoglobin: 16.1 g/dL (ref 13.0–17.0)
Lymphocytes Relative: 40 % (ref 12–46)
Lymphs Abs: 2.5 10*3/uL (ref 0.7–4.0)
MCH: 32.5 pg (ref 26.0–34.0)
MCHC: 35.9 g/dL (ref 30.0–36.0)
MCV: 90.7 fL (ref 78.0–100.0)
MONOS PCT: 9 % (ref 3–12)
MPV: 10 fL (ref 8.6–12.4)
Monocytes Absolute: 0.6 10*3/uL (ref 0.1–1.0)
NEUTROS PCT: 50 % (ref 43–77)
Neutro Abs: 3.2 10*3/uL (ref 1.7–7.7)
PLATELETS: 272 10*3/uL (ref 150–400)
RBC: 4.95 MIL/uL (ref 4.22–5.81)
RDW: 13 % (ref 11.5–15.5)
WBC: 6.3 10*3/uL (ref 4.0–10.5)

## 2015-08-25 LAB — BASIC METABOLIC PANEL WITH GFR
BUN: 17 mg/dL (ref 7–25)
CALCIUM: 10.3 mg/dL (ref 8.6–10.3)
CHLORIDE: 102 mmol/L (ref 98–110)
CO2: 26 mmol/L (ref 20–31)
CREATININE: 1.17 mg/dL (ref 0.70–1.25)
GFR, EST AFRICAN AMERICAN: 76 mL/min (ref >=60)
GFR, Est Non African American: 65 mL/min (ref >=60)
Glucose, Bld: 91 mg/dL (ref 65–99)
Potassium: 5 mmol/L (ref 3.5–5.3)
SODIUM: 137 mmol/L (ref 135–146)

## 2015-08-25 LAB — LIPID PANEL
CHOLESTEROL: 236 mg/dL — AB (ref 125–200)
HDL: 36 mg/dL — AB (ref >=40)
LDL Cholesterol: 152 mg/dL — ABNORMAL HIGH (ref <130)
TRIGLYCERIDES: 241 mg/dL — AB (ref <150)
Total CHOL/HDL Ratio: 6.6 Ratio — ABNORMAL HIGH (ref <=5.0)
VLDL: 48 mg/dL — ABNORMAL HIGH (ref <30)

## 2015-08-25 LAB — HEPATIC FUNCTION PANEL
ALT: 29 U/L (ref 9–46)
AST: 23 U/L (ref 10–35)
Albumin: 4.9 g/dL (ref 3.6–5.1)
Alkaline Phosphatase: 60 U/L (ref 40–115)
BILIRUBIN DIRECT: 0.1 mg/dL (ref <=0.2)
BILIRUBIN TOTAL: 0.5 mg/dL (ref 0.2–1.2)
Indirect Bilirubin: 0.4 mg/dL (ref 0.2–1.2)
Total Protein: 7.7 g/dL (ref 6.1–8.1)

## 2015-08-25 LAB — IRON AND TIBC
%SAT: 37 % (ref 15–60)
Iron: 150 ug/dL (ref 50–180)
TIBC: 410 ug/dL (ref 250–425)
UIBC: 260 ug/dL (ref 125–400)

## 2015-08-25 LAB — VITAMIN B12: Vitamin B-12: 640 pg/mL (ref 211–911)

## 2015-08-25 LAB — HEMOGLOBIN A1C
Hgb A1c MFr Bld: 5.4 % (ref <5.7)
Mean Plasma Glucose: 108 mg/dL (ref <117)

## 2015-08-25 LAB — TSH: TSH: 2.224 u[IU]/mL (ref 0.350–4.500)

## 2015-08-25 LAB — MAGNESIUM: MAGNESIUM: 2.3 mg/dL (ref 1.5–2.5)

## 2015-08-25 MED ORDER — FINASTERIDE 5 MG PO TABS: ORAL_TABLET | ORAL | Status: AC

## 2015-08-25 NOTE — Patient Instructions (Signed)

## 2015-08-25 NOTE — Progress Notes (Signed)
Patient ID: Troy [name], male   DOB: [birth year], 64 y.o.   MRN: [id number]   Wellness & Preventative Visit  And Comprehensive Evaluation,  Examination & Management   This very nice 64 y.o. MWM presents for  presents for a Wellness Visit & comprehensive evaluation and management of multiple medical co-morbidities.  Patient has been followed for Labile HTN, screened for Prediabetes, Hyperlipidemia, and Vitamin D Deficiency. Other problems include hx/o elevated PSA from 2012 to 2014 for which he underwent bx's which were negative and also recently had prost MRI which was negative for high grade macroscopic cancer or signs of metastatic disease.    Patient has been noted in the past to have labile elevated BP's of 138/923 and 136/78 in Sept 2015. Patient's reports random BP's have been controlled at home.Today's BP: 126/82 mmHg. Patient denies any cardiac symptoms as chest pain, palpitations, shortness of breath, dizziness or ankle swelling. He does exercise regularly as well as play golf.    Patient's hyperlipidemia is controlled with diet and medications. Patient denies myalgias or other medication SE's. Last lipids were Nl T Chol 181 (near goal of less than 180), LDL Chol 96 (near goal of less than 70), Elevated Trig 264 (above goal of 150) and Nl HDL 42.    Patient has Morbid obesity (BMI 30.69) & is proactively screened for prediabetes and patient denies reactive hypoglycemic symptoms, visual blurring, diabetic polys or paresthesias. Last A1c was 5.4% in Sept 2015.        Finally, patient has history of Vitamin D Deficiency and last vitamin D was 72 in Sept 2015.      Medication Sig  . Multiple Vitamin  Take by mouth.   Immunization History  Administered Date(s) Administered  . Influenza Split 10/30/2012, 08/20/2014, 08/25/2015  . Influenza Whole 10/22/2005, 07/19/2010, 08/04/2011  . PPD Test 08/20/2014, 08/25/2015  . Td 11/22/1998, 07/15/2009   Surg / Procedures  (1) Colonoscopy -2007 -> f/u  10 yr - [facility](2) Lasik OU 2005  (3) Lt THR Dr Ronnie Derby 2007  Family History  Problem Relation Age of Onset  . Heart disease Father   . Cancer Brother     Lung Cancer   Social History  . Marital Status: Married    Spouse Name: N/A  . Number of Children: N/A  . Years of Education: N/A   Occupational History  . 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 Smoking status: Never Smoker   . Smokeless tobacco: Not on file  . Alcohol Use: No  . Drug Use: Not on file  . Sexual Activity: Active    ROS Constitutional: Denies fever, chills, weight loss/gain, headaches, insomnia,  night sweats or change in appetite. Does c/o fatigue. Eyes: Denies redness, blurred vision, diplopia, discharge, itchy or watery eyes.  ENT: Denies discharge, congestion, post nasal drip, epistaxis, sore throat, earache, hearing loss, dental pain, Tinnitus, Vertigo, Sinus pain or snoring.  Cardio: Denies chest pain, palpitations, irregular heartbeat, syncope, dyspnea, diaphoresis, orthopnea, PND, claudication or edema Respiratory: denies cough, dyspnea, DOE, pleurisy, hoarseness, laryngitis or wheezing.  Gastrointestinal: Denies dysphagia, heartburn, reflux, water brash, pain, cramps, nausea, vomiting, bloating, diarrhea, constipation, hematemesis, melena, hematochezia, jaundice or hemorrhoids Genitourinary: Denies dysuria, frequency, urgency, nocturia, hesitancy, discharge, hematuria or flank pain Musculoskeletal: Denies arthralgia, myalgia, stiffness, Jt. Swelling, pain, limp or strain/sprain. Denies Falls. Skin: Denies puritis, rash, hives, warts, acne, eczema or change in skin lesion Neuro: No  weakness, tremor, incoordination, spasms, paresthesia or pain Psychiatric: Denies confusion, memory loss or sensory loss. Denies Depression. Endocrine: Denies change in weight, skin, hair change, nocturia, and paresthesia, diabetic polys, visual  blurring or hyper / hypo glycemic episodes.  Heme/Lymph: No excessive bleeding, bruising or enlarged lymph nodes.  Physical Exam  BP 126/82 mmHg  Pulse 64  Temp(Src) 97.4 F (36.3 C)  Resp 16  Ht 5\' 9"  (1.753 m)  Wt 207 lb 12.8 oz (94.257 kg)  BMI 30.67 kg/m2  General Appearance: Well nourished &  in no apparent distress. Eyes: PERRLA, EOMs, conjunctiva no swelling or erythema, normal fundi and vessels. Sinuses: No frontal/maxillary tenderness ENT/Mouth: EACs patent / TMs  nl. Nares clear without erythema, swelling, mucoid exudates. Oral hygiene is good. No erythema, swelling, or exudate. Tongue normal, non-obstructing. Tonsils not swollen or erythematous. Hearing normal.  Neck: Supple, thyroid normal. No bruits, nodes or JVD. Respiratory: Respiratory effort normal.  BS equal and clear bilateral without rales, rhonci, wheezing or stridor. Cardio: Heart sounds are normal with regular rate and rhythm and no murmurs, rubs or gallops. Peripheral pulses are normal and equal bilaterally without edema. No aortic or femoral bruits. Chest: symmetric with normal excursions and percussion.  Abdomen: Flat, soft, with bowel sounds. Nontender, no guarding, rebound, hernias, masses, or organomegaly.  Lymphatics: Non tender without lymphadenopathy.  Genitourinary:  DRE - deferred to Urology Musculoskeletal: Full ROM all peripheral extremities, joint stability, 5/5 strength, and normal gait. Skin: Warm and dry without rashes, lesions, cyanosis, clubbing or  ecchymosis.  Neuro: Cranial nerves intact, reflexes equal bilaterally. Normal muscle tone, no cerebellar symptoms. Sensation intact.  Pysch: Alert and oriented X 3 with normal affect, insight and judgment appropriate.   Assessment and Plan  1. Encounter for general adult medical examination with abnormal findings   - Recc bASA 81 mg daily  2. Elevated BP  - Microalbumin / creatinine urine ratio - EKG 12-Lead - [place], RETROPERITNL ABD,  LTD -  TSH  3. Elevated cholesterol          - Lipid panel  4. Other abnormal glucose  - Hemoglobin A1c - Insulin, random  5. Vitamin D deficiency  - Vit D  25 hydroxy  6. Screening for rectal cancer  - POC Hemoccult Bld/Stl   7. Prostate cancer screening / Elevated PSA  - PSA  - Rx Finasteride 5 mg #90  X 3 rf  - 6 mo f/u to recheck PSA  8. Other fatigue  - Vitamin B12 - Testosterone - Iron and TIBC - TSH  9. Medication management  - Urinalysis, Routine w reflex microscopic (not at [facility]) - CBC with Differential/Platelet - BASIC METABOLIC PANEL WITH GFR - Hepatic function panel - Magnesium   Continue prudent diet as discussed, weight control, BP monitoring, regular exercise, and medications as discussed.  Discussed med effects and SE's. Routine screening labs and tests as requested with regular follow-up as recommended.  Over 40 minutes of exam, counseling &  chart review was performed

## 2015-08-26 LAB — URINALYSIS, ROUTINE W REFLEX MICROSCOPIC
Bilirubin Urine: NEGATIVE
GLUCOSE, UA: NEGATIVE
HGB URINE DIPSTICK: NEGATIVE
LEUKOCYTES UA: NEGATIVE
NITRITE: NEGATIVE
PROTEIN: NEGATIVE
Specific Gravity, Urine: 1.022 (ref 1.001–1.035)
pH: 5.5 (ref 5.0–8.0)

## 2015-08-26 LAB — PSA: PSA: 4.74 ng/mL — ABNORMAL HIGH (ref <=4.00)

## 2015-08-26 LAB — MICROALBUMIN / CREATININE URINE RATIO
Creatinine, Urine: 279.3 mg/dL
Microalb Creat Ratio: 6.4 mg/g (ref 0.0–30.0)
Microalb, Ur: 1.8 mg/dL (ref <2.0)

## 2015-08-26 LAB — TESTOSTERONE: Testosterone: 460 ng/dL (ref 300–890)

## 2015-08-26 LAB — INSULIN, RANDOM: Insulin: 12.8 u[IU]/mL (ref 2.0–19.6)

## 2015-08-26 LAB — VITAMIN D 25 HYDROXY (VIT D DEFICIENCY, FRACTURES): VIT D 25 HYDROXY: 42 ng/mL (ref 30–100)

## 2015-08-29 LAB — TB SKIN TEST
INDURATION: 0 mm
TB Skin Test: NEGATIVE

## 2015-11-20 ENCOUNTER — Encounter: Payer: Self-pay | Admitting: Internal Medicine

## 2015-11-20 ENCOUNTER — Ambulatory Visit (INDEPENDENT_AMBULATORY_CARE_PROVIDER_SITE_OTHER): Payer: BC Managed Care – PPO | Admitting: Internal Medicine

## 2015-11-20 VITALS — BP 128/68 | HR 70 | Temp 98.0°F | Resp 18 | Ht 69.0 in | Wt 214.0 lb

## 2015-11-20 DIAGNOSIS — M150 Primary generalized (osteo)arthritis: Secondary | ICD-10-CM

## 2015-11-20 DIAGNOSIS — M159 Polyosteoarthritis, unspecified: Secondary | ICD-10-CM

## 2015-11-20 MED ORDER — MELOXICAM 15 MG PO TABS: 15.0000 mg | ORAL_TABLET | Freq: Every day | ORAL | Status: DC

## 2015-11-20 NOTE — Progress Notes (Signed)
   Subjective:    Patient ID: Troy [name], male    DOB: Dec 05, 1950, 64 y.o.   MRN: [id number]  HPI  Patient presents to the office for evaluation of joint pain which has been bothering him for the last few years.  He reports that it is worst first thing in the morning.  Once he gets moving he does better, but is still mildly painful.  He reports that he is doing [employer] 3 times per week.  He does find that advil and aleve to help.  He does not feel like ice or heat helps.  He reports that his wife is trying to stretch daily before bedtime.  He reports that his hip and lower back tend to bother him the most.  He is following with ortho for his hip replacement.  He sees Dr. Ronnie Derby.  He reports that his last visit with him was a couple years ago.    He reports that the hip was placed 10 years ago.  No history of back injury or back problems.    Review of Systems  Constitutional: Negative for fever, chills and fatigue.  Musculoskeletal: Positive for back pain and arthralgias. Negative for joint swelling.  Neurological: Negative for dizziness, weakness and numbness.       Objective:   Physical Exam  Constitutional: He is oriented to person, place, and time. He appears well-developed and well-nourished. No distress.  HENT:  Head: Normocephalic.  Mouth/Throat: Oropharynx is clear and moist. No oropharyngeal exudate.  Eyes: Conjunctivae are normal. No scleral icterus.  Neck: Normal range of motion. Neck supple. No JVD present. No thyromegaly present.  Cardiovascular: Normal rate, regular rhythm, normal heart sounds and intact distal pulses.  Exam reveals no gallop and no friction rub.   No murmur heard. Pulmonary/Chest: Effort normal and breath sounds normal. No respiratory distress. He has no wheezes. He has no rales. He exhibits no tenderness.  Musculoskeletal: Normal range of motion.       Right hip: Normal.       Left hip: Normal.       Lumbar back: He exhibits tenderness. He  exhibits normal range of motion, no bony tenderness, no swelling, no edema, no deformity, no laceration, no pain, no spasm and normal pulse.       Back:  Lymphadenopathy:    He has no cervical adenopathy.  Neurological: He is alert and oriented to person, place, and time.  Skin: Skin is warm and dry. He is not diaphoretic.  Psychiatric: He has a normal mood and affect. His behavior is normal. Judgment and thought content normal.  Nursing note and vitals reviewed.   Filed Vitals:   11/20/15 1504  BP: 128/68  Pulse: 70  Temp: 98 F (36.7 C)  Resp: 18          Assessment & Plan:    1. Primary osteoarthritis involving multiple joints -meloxicam -gentle stretching

## 2015-11-20 NOTE — Patient Instructions (Signed)
Meloxicam tablets What is this medicine? MELOXICAM (mel OX i cam) is a non-steroidal anti-inflammatory drug (NSAID). It is used to reduce swelling and to treat pain. It may be used for osteoarthritis, rheumatoid arthritis, or juvenile rheumatoid arthritis. This medicine may be used for other purposes; ask your health care provider or pharmacist if you have questions. What should I tell my health care provider before I take this medicine? They need to know if you have any of these conditions: -bleeding disorders -cigarette smoker -coronary artery bypass graft (CABG) surgery within the past 2 weeks -drink more than 3 alcohol-containing drinks per day -heart disease -high blood pressure -history of stomach bleeding -kidney disease -liver disease -lung or breathing disease, like asthma -stomach or intestine problems -an unusual or allergic reaction to meloxicam, aspirin, other NSAIDs, other medicines, foods, dyes, or preservatives -pregnant or trying to get pregnant -breast-feeding How should I use this medicine? Take this medicine by mouth with a full glass of water. Follow the directions on the prescription label. You can take it with or without food. If it upsets your stomach, take it with food. Take your medicine at regular intervals. Do not take it more often than directed. Do not stop taking except on your doctor's advice. A special MedGuide will be given to you by the pharmacist with each prescription and refill. Be sure to read this information carefully each time. Talk to your pediatrician regarding the use of this medicine in children. While this drug may be prescribed for selected conditions, precautions do apply. Patients over 41 years old may have a stronger reaction and need a smaller dose. Overdosage: If you think you have taken too much of this medicine contact a poison control center or emergency room at once. NOTE: This medicine is only for you. Do not share this medicine with  others. What if I miss a dose? If you miss a dose, take it as soon as you can. If it is almost time for your next dose, take only that dose. Do not take double or extra doses. What may interact with this medicine? Do not take this medicine with any of the following medications: -cidofovir -ketorolac This medicine may also interact with the following medications: -aspirin and aspirin-like medicines -certain medicines for blood pressure, heart disease, irregular heart beat -certain medicines for depression, anxiety, or psychotic disturbances -certain medicines that treat or prevent blood clots like warfarin, enoxaparin, dalteparin, apixaban, dabigatran, rivaroxaban -cyclosporine -digoxin -diuretics -methotrexate -other NSAIDs, medicines for pain and inflammation, like ibuprofen and naproxen -pemetrexed This list may not describe all possible interactions. Give your health care provider a list of all the medicines, herbs, non-prescription drugs, or dietary supplements you use. Also tell them if you smoke, drink alcohol, or use illegal drugs. Some items may interact with your medicine. What should I watch for while using this medicine? Tell your doctor or healthcare professional if your symptoms do not start to get better or if they get worse. Do not take other medicines that contain aspirin, ibuprofen, or naproxen with this medicine. Side effects such as stomach upset, nausea, or ulcers may be more likely to occur. Many medicines available without a prescription should not be taken with this medicine. This medicine can cause ulcers and bleeding in the stomach and intestines at any time during treatment. This can happen with no warning and may cause death. There is increased risk with taking this medicine for a long time. Smoking, drinking alcohol, older age, and poor health can  also increase risks. Call your doctor right away if you have stomach pain or blood in your vomit or stool. This medicine  does not prevent heart attack or stroke. In fact, this medicine may increase the chance of a heart attack or stroke. The chance may increase with longer use of this medicine and in people who have heart disease. If you take aspirin to prevent heart attack or stroke, talk with your doctor or health care professional. What side effects may I notice from receiving this medicine? Side effects that you should report to your doctor or health care professional as soon as possible: -allergic reactions like skin rash, itching or hives, swelling of the face, lips, or tongue -nausea, vomiting -signs and symptoms of a blood clot such as breathing problems; changes in vision; chest pain; severe, sudden headache; pain, swelling, warmth in the leg; trouble speaking; sudden numbness or weakness of the face, arm, or leg -signs and symptoms of bleeding such as bloody or black, tarry stools; red or dark-brown urine; spitting up blood or brown material that looks like coffee grounds; red spots on the skin; unusual bruising or bleeding from the eye, gums, or nose -signs and symptoms of liver injury like dark yellow or brown urine; general ill feeling or flu-like symptoms; light-colored stools; loss of appetite; nausea; right upper belly pain; unusually weak or tired; yellowing of the eyes or skin -signs and symptoms of stroke like changes in vision; confusion; trouble speaking or understanding; severe headaches; sudden numbness or weakness of the face, arm, or leg; trouble walking; dizziness; loss of balance or coordination Side effects that usually do not require medical attention (report these to your doctor or health care professional if they continue or are bothersome): -constipation -diarrhea -gas This list may not describe all possible side effects. Call your doctor for medical advice about side effects. You may report side effects to FDA at 1-800-FDA-1088. Where should I keep my medicine? Keep out of the reach of  children. Store at room temperature between 15 and 30 degrees C (59 and 86 degrees F). Throw away any unused medicine after the expiration date. NOTE: This sheet is a summary. It may not cover all possible information. If you have questions about this medicine, talk to your doctor, pharmacist, or health care provider.    2016, [employer]vier/[employer] Standard. (2015-05-29 13:02:23)  

## 2015-11-23 HISTORY — PX: POLYPECTOMY: SHX149

## 2015-12-15 ENCOUNTER — Encounter: Payer: Self-pay | Admitting: Gastroenterology

## 2015-12-22 ENCOUNTER — Ambulatory Visit (AMBULATORY_SURGERY_CENTER): Payer: Self-pay | Admitting: *Deleted

## 2015-12-22 VITALS — Ht 70.0 in | Wt 214.0 lb

## 2015-12-22 DIAGNOSIS — Z1211 Encounter for screening for malignant neoplasm of colon: Secondary | ICD-10-CM

## 2015-12-22 MED ORDER — NA SULFATE-K SULFATE-MG SULF 17.5-3.13-1.6 GM/177ML PO SOLN: 1.0000 | Freq: Once | ORAL | Status: DC

## 2015-12-22 NOTE — Progress Notes (Signed)
No egg or soy allergy known to patient  No issues with past sedation with any surgeries  or procedures, no intubation problems  No diet pills per patient No home 02 use per patient  No blood thinners per patient    

## 2015-12-23 ENCOUNTER — Encounter: Payer: Self-pay | Admitting: Gastroenterology

## 2015-12-29 ENCOUNTER — Encounter: Payer: Self-pay | Admitting: Gastroenterology

## 2015-12-29 ENCOUNTER — Ambulatory Visit (AMBULATORY_SURGERY_CENTER): Payer: Medicare Other | Admitting: Gastroenterology

## 2015-12-29 VITALS — BP 121/60 | HR 51 | Temp 95.7°F | Resp 13 | Ht 70.0 in | Wt 214.0 lb

## 2015-12-29 DIAGNOSIS — D125 Benign neoplasm of sigmoid colon: Secondary | ICD-10-CM

## 2015-12-29 DIAGNOSIS — Z1211 Encounter for screening for malignant neoplasm of colon: Secondary | ICD-10-CM

## 2015-12-29 DIAGNOSIS — D123 Benign neoplasm of transverse colon: Secondary | ICD-10-CM

## 2015-12-29 MED ORDER — SODIUM CHLORIDE 0.9 % IV SOLN: 500.0000 mL | INTRAVENOUS | Status: DC

## 2015-12-29 NOTE — Patient Instructions (Signed)
YOU HAD AN ENDOSCOPIC PROCEDURE TODAY AT [facility]:   Refer to the procedure report that was given to you for any specific questions about what was found during the examination.  If the procedure report does not answer your questions, please call your gastroenterologist to clarify.  If you requested that your care partner not be given the details of your procedure findings, then the procedure report has been included in a sealed envelope for you to review at your convenience later.  YOU SHOULD EXPECT: Some feelings of bloating in the abdomen. Passage of more gas than usual.  Walking can help get rid of the air that was put into your GI tract during the procedure and reduce the bloating. If you had a lower endoscopy (such as a colonoscopy or flexible sigmoidoscopy) you may notice spotting of blood in your stool or on the toilet paper. If you underwent a bowel prep for your procedure, you may not have a normal bowel movement for a few days.  Please Note:  You might notice some irritation and congestion in your nose or some drainage.  This is from the oxygen used during your procedure.  There is no need for concern and it should clear up in a day or so.  SYMPTOMS TO REPORT IMMEDIATELY:   Following lower endoscopy (colonoscopy or flexible sigmoidoscopy):  Excessive amounts of blood in the stool  Significant tenderness or worsening of abdominal pains  Swelling of the abdomen that is new, acute  Fever of 100F or higher   For urgent or emergent issues, a gastroenterologist can be reached at any hour by calling [phone].   DIET: Your first meal following the procedure should be a small meal and then it is ok to progress to your normal diet. Heavy or fried foods are harder to digest and may make you feel nauseous or bloated.  Likewise, meals heavy in dairy and vegetables can increase bloating.  Drink plenty of fluids but you should avoid alcoholic beverages for 24  hours.  ACTIVITY:  You should plan to take it easy for the rest of today and you should NOT DRIVE or use heavy machinery until tomorrow (because of the sedation medicines used during the test).    FOLLOW UP: Our staff will call the number listed on your records the next business day following your procedure to check on you and address any questions or concerns that you may have regarding the information given to you following your procedure. If we do not reach you, we will leave a message.  However, if you are feeling well and you are not experiencing any problems, there is no need to return our call.  We will assume that you have returned to your regular daily activities without incident.  If any biopsies were taken you will be contacted by phone or by letter within the next 1-3 weeks.  Please call us at [phone] if you have not heard about the biopsies in 3 weeks.    SIGNATURES/CONFIDENTIALITY: You and/or your care partner have signed paperwork which will be entered into your electronic medical record.  These signatures attest to the fact that that the information above on your After Visit Summary has been reviewed and is understood.  Full responsibility of the confidentiality of this discharge information lies with you and/or your care-partner.  Polyps, diverticulosis, high fiber diet, hemorrhoids-handouts given  Repeat colonoscopy will be determined by pathology

## 2015-12-29 NOTE — Progress Notes (Signed)
Called to room to assist during endoscopic procedure.  Patient ID and intended procedure confirmed with present staff. Received instructions for my participation in the procedure from the performing physician.  

## 2015-12-29 NOTE — Progress Notes (Signed)
No egg or soy allergy known to patient  No issues with past sedation with any surgeries  or procedures, no intubation problems  No diet pills per patient No home 02 use per patient  No blood thinners per patient    

## 2015-12-29 NOTE — Op Note (Signed)
[facility]  [address] [place], [address]   COLONOSCOPY PROCEDURE REPORT  PATIENT: Troy [name], Troy [name]  MR#: [id number] BIRTHDATE: Jun 09, 1951 , 86  yrs. old GENDER: male ENDOSCOPIST: Ladene Artist, MD, Marval Regal REFERRED BY:  Unk Pinto, M.D. PROCEDURE DATE:  12/29/2015 PROCEDURE:   Colonoscopy, screening and Colonoscopy with snare polypectomy First Screening Colonoscopy - Avg.  risk and is 50 yrs.  old or older - No.  Prior Negative Screening - Now for repeat screening. 10 or more years since last screening  History of Adenoma - Now for follow-up colonoscopy & has been > or = to 3 yrs.  N/A  Polyps removed today? Yes ASA CLASS:   Class II INDICATIONS:Screening for colonic neoplasia. MEDICATIONS: Monitored anesthesia care and Propofol 400 mg IV DESCRIPTION OF PROCEDURE:   After the risks benefits and alternatives of the procedure were thoroughly explained, informed consent was obtained.  The digital rectal exam revealed no abnormalities of the rectum.   The LB [id number]  endoscope was introduced through the anus and advanced to the cecum, which was identified by both the appendix and ileocecal valve. No adverse events experienced.   The quality of the prep was good.  (Suprep was used)  The instrument was then slowly withdrawn as the colon was fully examined. Estimated blood loss is zero unless otherwise noted in this procedure report.    COLON FINDINGS: Two sessile polyps measuring 6-7 mm in size were found in the sigmoid colon and transverse colon.  Polypectomies were performed with a cold snare.  The resection was complete, the polyp tissue was completely retrieved and sent to histology. There was moderate diverticulosis noted in the sigmoid colon and descending colon with associated colonic spasm and muscular hypertrophy.   The examination was otherwise normal.  Retroflexed views revealed internal Grade I hemorrhoids. The time to cecum = 4.1  Withdrawal time = 13.9   The scope was withdrawn and the procedure completed. COMPLICATIONS: There were no immediate complications.  ENDOSCOPIC IMPRESSION: 1.   Two sessile polyps in the sigmoid colon and transverse colon; polypectomies performed with a cold snare 2.   Moderate diverticulosis noted in the sigmoid colon and descending colon 3.   Grade l internal hemorrhoids  RECOMMENDATIONS: 1.  Await pathology results 2.  High fiber diet with liberal fluid intake. 3.  Repeat colonoscopy in 5 years if polyp(s) adenomatous; otherwise 10 years  eSigned:  Ladene Artist, MD, [facility] 12/29/2015 8:37 AM

## 2015-12-29 NOTE — Progress Notes (Signed)
To recovery, report to Mirts, RN, VSS. 

## 2015-12-30 ENCOUNTER — Telehealth: Payer: Self-pay | Admitting: *Deleted

## 2015-12-30 NOTE — Telephone Encounter (Signed)
Message left

## 2016-01-06 ENCOUNTER — Encounter: Payer: Self-pay | Admitting: Gastroenterology

## 2016-02-16 ENCOUNTER — Encounter: Payer: Self-pay | Admitting: Internal Medicine

## 2016-02-16 ENCOUNTER — Ambulatory Visit (INDEPENDENT_AMBULATORY_CARE_PROVIDER_SITE_OTHER): Payer: Medicare Other | Admitting: Internal Medicine

## 2016-02-16 VITALS — BP 110/68 | HR 64 | Temp 97.3°F | Resp 16 | Ht 67.5 in | Wt 206.8 lb

## 2016-02-16 DIAGNOSIS — J0100 Acute maxillary sinusitis, unspecified: Secondary | ICD-10-CM

## 2016-02-16 DIAGNOSIS — J0410 Acute tracheitis without obstruction: Secondary | ICD-10-CM

## 2016-02-16 MED ORDER — HYDROCODONE-ACETAMINOPHEN 5-325 MG PO TABS: ORAL_TABLET | ORAL | Status: DC

## 2016-02-16 MED ORDER — PREDNISONE 20 MG PO TABS: ORAL_TABLET | ORAL | Status: DC

## 2016-02-16 MED ORDER — AZITHROMYCIN 250 MG PO TABS: ORAL_TABLET | ORAL | Status: DC

## 2016-02-16 NOTE — Progress Notes (Signed)
  Subjective:    Patient ID: Troy [name], male    DOB: 06-09-1951, 65 y.o.   MRN: [id number]  HPI  This nice 65 yo MWM presents with 5-6 day hx/o head & chest congestion and sinus pressurte and dry non productive cough, myalgias, but no fever, chills sweats, rashes or dyspnea.   Medication Sig  . finasteride (PROSCAR) 5 MG tablet Take 1 tablet daily for Prostate  . meloxicam (MOBIC) 15 MG tablet Take 1 tablet (15 mg total) by mouth daily.  . Multiple Vitamin (MULTIVITAMIN) capsule Take by mouth.   No Known Allergies   Past Medical History  Diagnosis Date  . BPH (benign prostatic hyperplasia)    Review of Systems  10 point systems review negative except as above.    Objective:   Physical Exam  BP 110/68 mmHg  Pulse 64  Temp(Src) 97.3 F (36.3 C)  Resp 16  Ht 5' 7.5" (1.715 m)  Wt 206 lb 12.8 oz (93.804 kg)  BMI 31.89 kg/m2  (+) dry cough . No respiratory distress. Exposed skin w/o rash or cyanosis.  HEENT - Eac's patent. TM's Nl. EOM's full. PERRLA. (+) tender maxillary areas. NasoOroPharynx clear. Neck - supple. Nl Thyroid. Carotids 2+ & No bruits, nodes, JVD Chest - Scattered rales w/o rhonchi orwheezes. Cor - Nl HS. RRR w/o sig MGR.  MS- FROM w/o deformities.  Neuro -  Nl w/o focal abnormalities.    Assessment & Plan:   1. Tracheitis   2. Acute maxillary sinusitis, recurrence not specified  - predniSONE (DELTASONE) 20 MG tablet; 1 tab 3 x day for 3 days, then 1 tab 2 x day for 3 days, then 1 tab 1 x day for 5 days  Dispense: 20 tablet; Refill: 0 - azithromycin (ZITHROMAX) 250 MG tablet; Take 2 tablets (500 mg) on  Day 1,  followed by 1 tablet (250 mg) once daily on Days 2 through 5.  Dispense: 6 each; Refill: 1 - HYDROcodone-acetaminophen (NORCO) 5-325 MG tablet; Take 1/2 to 1 tablet every 4 hr as needed for cough  Dispense: 30 tablet; Refill: 0  - discussed meds & SE's.

## 2016-02-24 ENCOUNTER — Ambulatory Visit: Payer: Self-pay | Admitting: Internal Medicine

## 2016-04-01 ENCOUNTER — Encounter: Payer: Self-pay | Admitting: Internal Medicine

## 2016-04-01 ENCOUNTER — Ambulatory Visit (INDEPENDENT_AMBULATORY_CARE_PROVIDER_SITE_OTHER): Payer: Medicare Other | Admitting: Internal Medicine

## 2016-04-01 VITALS — BP 120/82 | HR 72 | Temp 97.5°F | Resp 16 | Ht 67.5 in | Wt 208.4 lb

## 2016-04-01 DIAGNOSIS — IMO0001 Reserved for inherently not codable concepts without codable children: Secondary | ICD-10-CM

## 2016-04-01 DIAGNOSIS — E782 Mixed hyperlipidemia: Secondary | ICD-10-CM

## 2016-04-01 DIAGNOSIS — R7309 Other abnormal glucose: Secondary | ICD-10-CM

## 2016-04-01 DIAGNOSIS — E559 Vitamin D deficiency, unspecified: Secondary | ICD-10-CM | POA: Diagnosis not present

## 2016-04-01 DIAGNOSIS — Z79899 Other long term (current) drug therapy: Secondary | ICD-10-CM

## 2016-04-01 DIAGNOSIS — R030 Elevated blood-pressure reading, without diagnosis of hypertension: Secondary | ICD-10-CM

## 2016-04-01 DIAGNOSIS — Z0001 Encounter for general adult medical examination with abnormal findings: Secondary | ICD-10-CM | POA: Insufficient documentation

## 2016-04-01 LAB — CBC WITH DIFFERENTIAL/PLATELET
BASOS ABS: 0 {cells}/uL (ref 0–200)
BASOS PCT: 0 %
EOS PCT: 1 %
Eosinophils Absolute: 65 cells/uL (ref 15–500)
HCT: 40.6 % (ref 38.5–50.0)
HEMOGLOBIN: 14 g/dL (ref 13.2–17.1)
LYMPHS ABS: 2535 {cells}/uL (ref 850–3900)
Lymphocytes Relative: 39 %
MCH: 31.7 pg (ref 27.0–33.0)
MCHC: 34.5 g/dL (ref 32.0–36.0)
MCV: 92.1 fL (ref 80.0–100.0)
MPV: 10.3 fL (ref 7.5–12.5)
Monocytes Absolute: 715 cells/uL (ref 200–950)
Monocytes Relative: 11 %
NEUTROS ABS: 3185 {cells}/uL (ref 1500–7800)
Neutrophils Relative %: 49 %
Platelets: 213 10*3/uL (ref 140–400)
RBC: 4.41 MIL/uL (ref 4.20–5.80)
RDW: 13.5 % (ref 11.0–15.0)
WBC: 6.5 10*3/uL (ref 3.8–10.8)

## 2016-04-01 NOTE — Progress Notes (Signed)
Patient ID: Troy [name], male   DOB: Dec 27, 1950, 65 y.o.   MRN: [id number]  [facility]   Unk Pinto, M.D.    Uvaldo Bristle. Silverio Lay, P.A.-C      Starlyn Skeans, P.A.-C   [facility]                [address] [place] [id number] Telephone [phone] Telefax [phone] _________________________________   This very nice 65 y.o. MWM presents for 6 month follow up with Hypertension, Hyperlipidemia, Pre-Diabetes and Vitamin D Deficiency. Patient is also followed for hx/o elevated PSA's.    Patient has hx/o labile HTN and is followed expectantly. BP has been controlled and today's BP: 120/82 mmHg. Patient has had no complaints of any cardiac type chest pain, palpitations, dyspnea/orthopnea/PND, dizziness, claudication, or dependent edema.   Hyperlipidemia is controlled with diet & meds. Patient denies myalgias or other med SE's. Last Lipids were not at goal with Cholesterol 236*; HDL 36*; LDL 152*; and elevated Triglycerides 241 on 08/25/2015.   Also, the patient is monitored proactively for PreDiabetes and has had no symptoms of reactive hypoglycemia, diabetic polys, paresthesias or visual blurring.  Last A1c was 5.4% on 08/25/2015.   Further, the patient also has history of Vitamin D Deficiency and supplements vitamin D without any suspected side-effects. Last vitamin D was 42 on 08/25/2015.    Medication Sig  . finasteride 5 MG tablet Take 1 tab daily for Prostate  . meloxicam  15 MG tablet Take 1 tab daily.  . Multiple Vitamin Take daily   No Known Allergies  PMHx:   Past Medical History  Diagnosis Date  . BPH (benign prostatic hyperplasia)    Immunization History  Administered Date(s) Administered  . Influenza Split 10/30/2012, 08/20/2014, 08/25/2015  . Influenza Whole 10/22/2005, 07/19/2010, 08/04/2011  . PPD Test 08/20/2014, 08/25/2015  . Td 11/22/1998, 07/15/2009   Past Surgical  History  Procedure Laterality Date  . Eye surgery Bilateral 2005    lasik  . 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 Colonoscopy  11-24-2005    [facility] ONLY   FHx:    Reviewed / unchanged  SHx:    Reviewed / unchanged  Systems Review:  Constitutional: Denies fever, chills, wt changes, headaches, insomnia, fatigue, night sweats, change in appetite. Eyes: Denies redness, blurred vision, diplopia, discharge, itchy, watery eyes.  ENT: Denies discharge, congestion, post nasal drip, epistaxis, sore throat, earache, hearing loss, dental pain, tinnitus, vertigo, sinus pain, snoring.  CV: Denies chest pain, palpitations, irregular heartbeat, syncope, dyspnea, diaphoresis, orthopnea, PND, claudication or edema. Respiratory: denies cough, dyspnea, DOE, pleurisy, hoarseness, laryngitis, wheezing.  Gastrointestinal: Denies dysphagia, odynophagia, heartburn, reflux, water brash, abdominal pain or cramps, nausea, vomiting, bloating, diarrhea, constipation, hematemesis, melena, hematochezia  or hemorrhoids. Genitourinary: Denies dysuria, frequency, urgency, nocturia, hesitancy, discharge, hematuria or flank pain. Musculoskeletal: Denies arthralgias, myalgias, stiffness, jt. swelling, pain, limping or strain/sprain.  Skin: Denies pruritus, rash, hives, warts, acne, eczema or change in skin lesion(s). Neuro: No weakness, tremor, incoordination, spasms, paresthesia or pain. Psychiatric: Denies confusion, memory loss or sensory loss. Endo: Denies change in weight, skin or hair change.  Heme/Lymph: No excessive bleeding, bruising or enlarged lymph nodes.  Physical Exam  BP 120/82 mmHg  Pulse 72  Temp(Src) 97.5 F (36.4 C)  Resp 16  Ht 5' 7.5" (1.715 m)  Wt 208 lb 6.4 oz (94.53 kg)  BMI 32.14 kg/m2  Appears well nourished and in no distress. Eyes: PERRLA, EOMs, conjunctiva no swelling or erythema. Sinuses: No frontal/maxillary tenderness ENT/Mouth: EAC's clear, TM's nl w/o erythema,  bulging. Nares clear w/o erythema, swelling, exudates. Oropharynx clear without erythema or exudates. Oral hygiene is good. Tongue normal, non obstructing. Hearing intact.  Neck: Supple. Thyroid nl. Car 2+/2+ without bruits, nodes or JVD. Chest: Respirations nl with BS clear & equal w/o rales, rhonchi, wheezing or stridor.  Cor: Heart sounds normal w/ regular rate and rhythm without sig. murmurs, gallops, clicks, or rubs. Peripheral pulses normal and equal  without edema.  Abdomen: Soft & bowel sounds normal. Non-tender w/o guarding, rebound, hernias, masses, or organomegaly.  Lymphatics: Unremarkable.  Musculoskeletal: Full ROM all peripheral extremities, joint stability, 5/5 strength, and normal gait.  Skin: Warm, dry without exposed rashes, lesions or ecchymosis apparent.  Neuro: Cranial nerves intact, reflexes equal bilaterally. Sensory-motor testing grossly intact. Tendon reflexes grossly intact.  Pysch: Alert & oriented x 3.  Insight and judgement nl & appropriate. No ideations.  Assessment and Plan:  1. HTN, Labile  - TSH  2. Hyperlipidemia  - Lipid panel - TSH  3. Other abnormal glucose  - Hemoglobin A1c - Insulin, random  4. Vitamin D deficiency  - VITAMIN D 25 Hydroxy   5. Medication management  - CBC with Differential/Platelet - BASIC METABOLIC PANEL WITH GFR - Hepatic function panel - Magnesium   Recommended regular exercise, BP monitoring, weight control, and discussed med and SE's. Recommended labs to assess and monitor clinical status. Further disposition pending results of labs. Over 30 minutes of exam, counseling, chart review was performed

## 2016-04-01 NOTE — Patient Instructions (Signed)

## 2016-04-02 LAB — BASIC METABOLIC PANEL WITH GFR
BUN: 24 mg/dL (ref 7–25)
CHLORIDE: 104 mmol/L (ref 98–110)
CO2: 20 mmol/L (ref 20–31)
Calcium: 9.3 mg/dL (ref 8.6–10.3)
Creat: 1.13 mg/dL (ref 0.70–1.25)
GFR, EST NON AFRICAN AMERICAN: 68 mL/min (ref >=60)
GFR, Est African American: 78 mL/min (ref >=60)
Glucose, Bld: 93 mg/dL (ref 65–99)
POTASSIUM: 4.3 mmol/L (ref 3.5–5.3)
SODIUM: 138 mmol/L (ref 135–146)

## 2016-04-02 LAB — LIPID PANEL
CHOL/HDL RATIO: 4.8 ratio (ref <=5.0)
Cholesterol: 187 mg/dL (ref 125–200)
HDL: 39 mg/dL — AB (ref >=40)
LDL Cholesterol: 117 mg/dL (ref <130)
Triglycerides: 154 mg/dL — ABNORMAL HIGH (ref <150)
VLDL: 31 mg/dL — AB (ref <30)

## 2016-04-02 LAB — TSH: TSH: 1.91 m[IU]/L (ref 0.40–4.50)

## 2016-04-02 LAB — HEPATIC FUNCTION PANEL
ALT: 20 U/L (ref 9–46)
AST: 20 U/L (ref 10–35)
Albumin: 4.2 g/dL (ref 3.6–5.1)
Alkaline Phosphatase: 54 U/L (ref 40–115)
BILIRUBIN DIRECT: 0.1 mg/dL (ref <=0.2)
BILIRUBIN INDIRECT: 0.2 mg/dL (ref 0.2–1.2)
Total Bilirubin: 0.3 mg/dL (ref 0.2–1.2)
Total Protein: 6.7 g/dL (ref 6.1–8.1)

## 2016-04-02 LAB — HEMOGLOBIN A1C
HEMOGLOBIN A1C: 5.2 % (ref <5.7)
Mean Plasma Glucose: 103 mg/dL

## 2016-04-02 LAB — MAGNESIUM: Magnesium: 2.2 mg/dL (ref 1.5–2.5)

## 2016-04-02 LAB — INSULIN, RANDOM: Insulin: 14.2 u[IU]/mL (ref 2.0–19.6)

## 2016-04-02 LAB — VITAMIN D 25 HYDROXY (VIT D DEFICIENCY, FRACTURES): Vit D, 25-Hydroxy: 60 ng/mL (ref 30–100)

## 2016-05-13 ENCOUNTER — Other Ambulatory Visit: Payer: Self-pay | Admitting: Internal Medicine

## 2016-05-15 ENCOUNTER — Other Ambulatory Visit: Payer: Self-pay | Admitting: Internal Medicine

## 2016-09-19 NOTE — Patient Instructions (Signed)
Preventive Care for Adults A healthy lifestyle and preventive care can promote health and wellness. Preventive health guidelines for men include the following key practices:  A routine yearly physical is a good way to check with your health care provider about your health and preventative screening. It is a chance to share any concerns and updates on your health and to receive a thorough exam.  Visit your dentist for a routine exam and preventative care every 6 months. Brush your teeth twice a day and floss once a day. Good oral hygiene prevents tooth decay and gum disease.  The frequency of eye exams is based on your age, health, family medical history, use of contact lenses, and other factors. Follow your health care provider's recommendations for frequency of eye exams.  Eat a healthy diet. Foods such as vegetables, fruits, whole grains, low-fat dairy products, and lean protein foods contain the nutrients you need without too many calories. Decrease your intake of foods high in solid fats, added sugars, and salt. Eat the right amount of calories for you.Get information about a proper diet from your health care provider, if necessary.  Regular physical exercise is one of the most important things you can do for your health. Most adults should get at least 150 minutes of moderate-intensity exercise (any activity that increases your heart rate and causes you to sweat) each week. In addition, most adults need muscle-strengthening exercises on 2 or more days a week.  Maintain a healthy weight. The body mass index (BMI) is a screening tool to identify possible weight problems. It provides an estimate of body fat based on height and weight. Your health care provider can find your BMI and can help you achieve or maintain a healthy weight.For adults 20 years and older:  A BMI below 18.5 is considered underweight.  A BMI of 18.5 to 24.9 is normal.  A BMI of 25 to 29.9 is considered overweight.  A BMI  of 30 and above is considered obese.  Maintain normal blood lipids and cholesterol levels by exercising and minimizing your intake of saturated fat. Eat a balanced diet with plenty of fruit and vegetables. Blood tests for lipids and cholesterol should begin at age 50 and be repeated every 5 years. If your lipid or cholesterol levels are high, you are over 50, or you are at high risk for heart disease, you may need your cholesterol levels checked more frequently.Ongoing high lipid and cholesterol levels should be treated with medicines if diet and exercise are not working.  If you smoke, find out from your health care provider how to quit. If you do not use tobacco, do not start.  Lung cancer screening is recommended for adults aged 73-80 years who are at high risk for developing lung cancer because of a history of smoking. A yearly low-dose CT scan of the lungs is recommended for people who have at least a 30-pack-year history of smoking and are a current smoker or have quit within the past 15 years. A pack year of smoking is smoking an average of 1 pack of cigarettes a day for 1 year (for example: 1 pack a day for 30 years or 2 packs a day for 15 years). Yearly screening should continue until the smoker has stopped smoking for at least 15 years. Yearly screening should be stopped for people who develop a health problem that would prevent them from having lung cancer treatment.  If you choose to drink alcohol, do not have more than  2 drinks per day. One drink is considered to be 12 ounces (355 mL) of beer, 5 ounces (148 mL) of wine, or 1.5 ounces (44 mL) of liquor.  Avoid use of street drugs. Do not share needles with anyone. Ask for help if you need support or instructions about stopping the use of drugs.  High blood pressure causes heart disease and increases the risk of stroke. Your blood pressure should be checked at least every 1-2 years. Ongoing high blood pressure should be treated with  medicines, if weight loss and exercise are not effective.  If you are 45-79 years old, ask your health care provider if you should take aspirin to prevent heart disease.  Diabetes screening involves taking a blood sample to check your fasting blood sugar level. This should be done once every 3 years, after age 45, if you are within normal weight and without risk factors for diabetes. Testing should be considered at a younger age or be carried out more frequently if you are overweight and have at least 1 risk factor for diabetes.  Colorectal cancer can be detected and often prevented. Most routine colorectal cancer screening begins at the age of 50 and continues through age 75. However, your health care provider may recommend screening at an earlier age if you have risk factors for colon cancer. On a yearly basis, your health care provider may provide home test kits to check for hidden blood in the stool. Use of a small camera at the end of a tube to directly examine the colon (sigmoidoscopy or colonoscopy) can detect the earliest forms of colorectal cancer. Talk to your health care provider about this at age 50, when routine screening begins. Direct exam of the colon should be repeated every 5-10 years through age 75, unless early forms of precancerous polyps or small growths are found.  People who are at an increased risk for hepatitis B should be screened for this virus. You are considered at high risk for hepatitis B if:  You were born in a country where hepatitis B occurs often. Talk with your health care provider about which countries are considered high risk.  Your parents were born in a high-risk country and you have not received a shot to protect against hepatitis B (hepatitis B vaccine).  You have HIV or AIDS.  You use needles to inject street drugs.  You live with, or have sex with, someone who has hepatitis B.  You are a man who has sex with other men (MSM).  You get hemodialysis  treatment.  You take certain medicines for conditions such as cancer, organ transplantation, and autoimmune conditions.  Hepatitis C blood testing is recommended for all people born from 1945 through 1965 and any individual with known risks for hepatitis C.  Practice safe sex. Use condoms and avoid high-risk sexual practices to reduce the spread of sexually transmitted infections (STIs). STIs include gonorrhea, chlamydia, syphilis, trichomonas, herpes, HPV, and human immunodeficiency virus (HIV). Herpes, HIV, and HPV are viral illnesses that have no cure. They can result in disability, cancer, and death.  If you are at risk of being infected with HIV, it is recommended that you take a prescription medicine daily to prevent HIV infection. This is called preexposure prophylaxis (PrEP). You are considered at risk if:  You are a man who has sex with other men (MSM) and have other risk factors.  You are a heterosexual man, are sexually active, and are at increased risk for HIV infection.    You take drugs by injection.  You are sexually active with a partner who has HIV.  Talk with your health care provider about whether you are at high risk of being infected with HIV. If you choose to begin PrEP, you should first be tested for HIV. You should then be tested every 3 months for as long as you are taking PrEP.  A one-time screening for abdominal aortic aneurysm (AAA) and surgical repair of large AAAs by ultrasound are recommended for men ages 32 to 67 years who are current or former smokers.  Healthy men should no longer receive prostate-specific antigen (PSA) blood tests as part of routine cancer screening. Talk with your health care provider about prostate cancer screening.  Testicular cancer screening is not recommended for adult males who have no symptoms. Screening includes self-exam, a health care provider exam, and other screening tests. Consult with your health care provider about any symptoms  you have or any concerns you have about testicular cancer.  Use sunscreen. Apply sunscreen liberally and repeatedly throughout the day. You should seek shade when your shadow is shorter than you. Protect yourself by wearing long sleeves, pants, a wide-brimmed hat, and sunglasses year round, whenever you are outdoors.  Once a month, do a whole-body skin exam, using a mirror to look at the skin on your back. Tell your health care provider about new moles, moles that have irregular borders, moles that are larger than a pencil eraser, or moles that have changed in shape or color.  Stay current with required vaccines (immunizations).  Influenza vaccine. All adults should be immunized every year.  Tetanus, diphtheria, and acellular pertussis (Td, Tdap) vaccine. An adult who has not previously received Tdap or who does not know his vaccine status should receive 1 dose of Tdap. This initial dose should be followed by tetanus and diphtheria toxoids (Td) booster doses every 10 years. Adults with an unknown or incomplete history of completing a 3-dose immunization series with Td-containing vaccines should begin or complete a primary immunization series including a Tdap dose. Adults should receive a Td booster every 10 years.  Varicella vaccine. An adult without evidence of immunity to varicella should receive 2 doses or a second dose if he has previously received 1 dose.  Human papillomavirus (HPV) vaccine. Males aged 68-21 years who have not received the vaccine previously should receive the 3-dose series. Males aged 22-26 years may be immunized. Immunization is recommended through the age of 6 years for any male who has sex with males and did not get any or all doses earlier. Immunization is recommended for any person with an immunocompromised condition through the age of 49 years if he did not get any or all doses earlier. During the 3-dose series, the second dose should be obtained 4-8 weeks after the first  dose. The third dose should be obtained 24 weeks after the first dose and 16 weeks after the second dose.  Zoster vaccine. One dose is recommended for adults aged 50 years or older unless certain conditions are present.  Measles, mumps, and rubella (MMR) vaccine. Adults born before [birth year] generally are considered immune to measles and mumps. Adults born in [birth year] or later should have 1 or more doses of MMR vaccine unless there is a contraindication to the vaccine or there is laboratory evidence of immunity to each of the three diseases. A routine second dose of MMR vaccine should be obtained at least 28 days after the first dose for students attending postsecondary  schools, health care workers, or international travelers. People who received inactivated measles vaccine or an unknown type of measles vaccine during 1963-1967 should receive 2 doses of MMR vaccine. People who received inactivated mumps vaccine or an unknown type of mumps vaccine before 1979 and are at high risk for mumps infection should consider immunization with 2 doses of MMR vaccine. Unvaccinated health care workers born before 1957 who lack laboratory evidence of measles, mumps, or rubella immunity or laboratory confirmation of disease should consider measles and mumps immunization with 2 doses of MMR vaccine or rubella immunization with 1 dose of MMR vaccine.  Pneumococcal 13-valent conjugate (PCV13) vaccine. When indicated, a person who is uncertain of his immunization history and has no record of immunization should receive the PCV13 vaccine. An adult aged 19 years or older who has certain medical conditions and has not been previously immunized should receive 1 dose of PCV13 vaccine. This PCV13 should be followed with a dose of pneumococcal polysaccharide (PPSV23) vaccine. The PPSV23 vaccine dose should be obtained at least 8 weeks after the dose of PCV13 vaccine. An adult aged 19 years or older who has certain medical conditions and  previously received 1 or more doses of PPSV23 vaccine should receive 1 dose of PCV13. The PCV13 vaccine dose should be obtained 1 or more years after the last PPSV23 vaccine dose.  Pneumococcal polysaccharide (PPSV23) vaccine. When PCV13 is also indicated, PCV13 should be obtained first. All adults aged 65 years and older should be immunized. An adult younger than age 65 years who has certain medical conditions should be immunized. Any person who resides in a nursing home or long-term care facility should be immunized. An adult smoker should be immunized. People with an immunocompromised condition and certain other conditions should receive both PCV13 and PPSV23 vaccines. People with human immunodeficiency virus (HIV) infection should be immunized as soon as possible after diagnosis. Immunization during chemotherapy or radiation therapy should be avoided. Routine use of PPSV23 vaccine is not recommended for American Indians, [place] Natives, or people younger than 65 years unless there are medical conditions that require PPSV23 vaccine. When indicated, people who have unknown immunization and have no record of immunization should receive PPSV23 vaccine. One-time revaccination 5 years after the first dose of PPSV23 is recommended for people aged 19-64 years who have chronic kidney failure, nephrotic syndrome, asplenia, or immunocompromised conditions. People who received 1-2 doses of PPSV23 before age 65 years should receive another dose of PPSV23 vaccine at age 65 years or later if at least 5 years have passed since the previous dose. Doses of PPSV23 are not needed for people immunized with PPSV23 at or after age 65 years.  Meningococcal vaccine. Adults with asplenia or persistent complement component deficiencies should receive 2 doses of quadrivalent meningococcal conjugate (MenACWY-D) vaccine. The doses should be obtained at least 2 months apart. Microbiologists working with certain meningococcal bacteria,  military recruits, people at risk during an outbreak, and people who travel to or live in countries with a high rate of meningitis should be immunized. A first-year college student up through age 21 years who is living in a residence hall should receive a dose if he did not receive a dose on or after his 16th birthday. Adults who have certain high-risk conditions should receive one or more doses of vaccine.  Hepatitis A vaccine. Adults who wish to be protected from this disease, have certain high-risk conditions, work with hepatitis A-infected animals, work in hepatitis A research labs, or   travel to or work in countries with a high rate of hepatitis A should be immunized. Adults who were previously unvaccinated and who anticipate close contact with an international adoptee during the first 60 days after arrival in the [place] from a country with a high rate of hepatitis A should be immunized.  Hepatitis B vaccine. Adults should be immunized if they wish to be protected from this disease, have certain high-risk conditions, may be exposed to blood or other infectious body fluids, are household contacts or sex partners of hepatitis B positive people, are clients or workers in certain care facilities, or travel to or work in countries with a high rate of hepatitis B.  Haemophilus influenzae type b (Hib) vaccine. A previously unvaccinated person with asplenia or sickle cell disease or having a scheduled splenectomy should receive 1 dose of Hib vaccine. Regardless of previous immunization, a recipient of a hematopoietic stem cell transplant should receive a 3-dose series 6-12 months after his successful transplant. Hib vaccine is not recommended for adults with HIV infection. Preventive Service / Frequency Ages 52 to 17  Blood pressure check.** / Every 1 to 2 years.  Lipid and cholesterol check.** / Every 5 years beginning at age 69.  Hepatitis C blood test.** / For any individual with known risks for  hepatitis C.  Skin self-exam. / Monthly.  Influenza vaccine. / Every year.  Tetanus, diphtheria, and acellular pertussis (Tdap, Td) vaccine.** / Consult your health care provider. 1 dose of Td every 10 years.  Varicella vaccine.** / Consult your health care provider.  HPV vaccine. / 3 doses over 6 months, if 72 or younger.  Measles, mumps, rubella (MMR) vaccine.** / You need at least 1 dose of MMR if you were born in 1957 or later. You may also need a second dose.  Pneumococcal 13-valent conjugate (PCV13) vaccine.** / Consult your health care provider.  Pneumococcal polysaccharide (PPSV23) vaccine.** / 1 to 2 doses if you smoke cigarettes or if you have certain conditions.  Meningococcal vaccine.** / 1 dose if you are age 35 to 60 years and a Market researcher living in a residence hall, or have one of several medical conditions. You may also need additional booster doses.  Hepatitis A vaccine.** / Consult your health care provider.  Hepatitis B vaccine.** / Consult your health care provider.  Haemophilus influenzae type b (Hib) vaccine.** / Consult your health care provider. Ages 35 to 8  Blood pressure check.** / Every 1 to 2 years.  Lipid and cholesterol check.** / Every 5 years beginning at age 57.  Lung cancer screening. / Every year if you are aged 44-80 years and have a 30-pack-year history of smoking and currently smoke or have quit within the past 15 years. Yearly screening is stopped once you have quit smoking for at least 15 years or develop a health problem that would prevent you from having lung cancer treatment.  Fecal occult blood test (FOBT) of stool. / Every year beginning at age 55 and continuing until age 73. You may not have to do this test if you get a colonoscopy every 10 years.  Flexible sigmoidoscopy** or colonoscopy.** / Every 5 years for a flexible sigmoidoscopy or every 10 years for a colonoscopy beginning at age 28 and continuing until age  1.  Hepatitis C blood test.** / For all people born from [birth year] through 1965 and any individual with known risks for hepatitis C.  Skin self-exam. / Monthly.  Influenza vaccine. / Every  year.  Tetanus, diphtheria, and acellular pertussis (Tdap/Td) vaccine.** / Consult your health care provider. 1 dose of Td every 10 years.  Varicella vaccine.** / Consult your health care provider.  Zoster vaccine.** / 1 dose for adults aged 13 years or older.  Measles, mumps, rubella (MMR) vaccine.** / You need at least 1 dose of MMR if you were born in 1957 or later. You may also need a second dose.  Pneumococcal 13-valent conjugate (PCV13) vaccine.** / Consult your health care provider.  Pneumococcal polysaccharide (PPSV23) vaccine.** / 1 to 2 doses if you smoke cigarettes or if you have certain conditions.  Meningococcal vaccine.** / Consult your health care provider.  Hepatitis A vaccine.** / Consult your health care provider.  Hepatitis B vaccine.** / Consult your health care provider.  Haemophilus influenzae type b (Hib) vaccine.** / Consult your health care provider. Ages 32 and over  Blood pressure check.** / Every 1 to 2 years.  Lipid and cholesterol check.**/ Every 5 years beginning at age 62.  Lung cancer screening. / Every year if you are aged 77-80 years and have a 30-pack-year history of smoking and currently smoke or have quit within the past 15 years. Yearly screening is stopped once you have quit smoking for at least 15 years or develop a health problem that would prevent you from having lung cancer treatment.  Fecal occult blood test (FOBT) of stool. / Every year beginning at age 23 and continuing until age 64. You may not have to do this test if you get a colonoscopy every 10 years.  Flexible sigmoidoscopy** or colonoscopy.** / Every 5 years for a flexible sigmoidoscopy or every 10 years for a colonoscopy beginning at age 70 and continuing until age 1.  Hepatitis C blood  test.** / For all people born from [birth year] through 1965 and any individual with known risks for hepatitis C.  Abdominal aortic aneurysm (AAA) screening./ Screening current or former smokers or have Hypertension.  Skin self-exam. / Monthly.  Influenza vaccine. / Every year.  Tetanus, diphtheria, and acellular pertussis (Tdap/Td) vaccine.** / 1 dose of Td every 10 years.  Varicella vaccine.** / Consult your health care provider.  Zoster vaccine.** / 1 dose for adults aged 44 years or older.  Pneumococcal 13-valent conjugate (PCV13) vaccine.** / Consult your health care provider.  Pneumococcal polysaccharide (PPSV23) vaccine.** / 1 dose for all adults aged 52 years and older.  Meningococcal vaccine.** / Consult your health care provider.  Hepatitis A vaccine.** / Consult your health care provider.  Hepatitis B vaccine.** / Consult your health care provider.  Haemophilus influenzae type b (Hib) vaccine.** / Consult your health care provider.  Health Maintenance A healthy lifestyle and preventative care can promote health and wellness. Maintain regular health, dental, and eye exams. Eat a healthy diet. Foods like vegetables, fruits, whole grains, low-fat dairy products, and lean protein foods contain the nutrients you need and are low in calories. Decrease your intake of foods high in solid fats, added sugars, and salt. Get information about a proper diet from your health care provider, if necessary. Regular physical exercise is one of the most important things you can do for your health. Most adults should get at least 150 minutes of moderate-intensity exercise (any activity that increases your heart rate and causes you to sweat) each week. In addition, most adults need muscle-strengthening exercises on 2 or more days a week.  Maintain a healthy weight. The body mass index (BMI) is a screening tool to identify  possible weight problems. It provides an estimate of body fat based on height and  weight. Your health care provider can find your BMI and can help you achieve or maintain a healthy weight. For males 20 years and older: A BMI below 18.5 is considered underweight. A BMI of 18.5 to 24.9 is normal. A BMI of 25 to 29.9 is considered overweight. A BMI of 30 and above is considered obese. Maintain normal blood lipids and cholesterol by exercising and minimizing your intake of saturated fat. Eat a balanced diet with plenty of fruits and vegetables. Blood tests for lipids and cholesterol should begin at age 79 and be repeated every 5 years. If your lipid or cholesterol levels are high, you are over age 40, or you are at high risk for heart disease, you may need your cholesterol levels checked more frequently.Ongoing high lipid and cholesterol levels should be treated with medicines if diet and exercise are not working. If you smoke, find out from your health care provider how to quit. If you do not use tobacco, do not start. Lung cancer screening is recommended for adults aged 65-80 years who are at high risk for developing lung cancer because of a history of smoking. A yearly low-dose CT scan of the lungs is recommended for people who have at least a 30-pack-year history of smoking and are current smokers or have quit within the past 15 years. A pack year of smoking is smoking an average of 1 pack of cigarettes a day for 1 year (for example, a 30-pack-year history of smoking could mean smoking 1 pack a day for 30 years or 2 packs a day for 15 years). Yearly screening should continue until the smoker has stopped smoking for at least 15 years. Yearly screening should be stopped for people who develop a health problem that would prevent them from having lung cancer treatment. If you choose to drink alcohol, do not have more than 2 drinks per day. One drink is considered to be 12 oz (360 mL) of beer, 5 oz (150 mL) of wine, or 1.5 oz (45 mL) of liquor. Avoid the use of street drugs. Do not share  needles with anyone. Ask for help if you need support or instructions about stopping the use of drugs. High blood pressure causes heart disease and increases the risk of stroke. Blood pressure should be checked at least every 1-2 years. Ongoing high blood pressure should be treated with medicines if weight loss and exercise are not effective. If you are 78-24 years old, ask your health care provider if you should take aspirin to prevent heart disease. Diabetes screening involves taking a blood sample to check your fasting blood sugar level. This should be done once every 3 years after age 63 if you are at a normal weight and without risk factors for diabetes. Testing should be considered at a younger age or be carried out more frequently if you are overweight and have at least 1 risk factor for diabetes. Colorectal cancer can be detected and often prevented. Most routine colorectal cancer screening begins at the age of 49 and continues through age 22. However, your health care provider may recommend screening at an earlier age if you have risk factors for colon cancer. On a yearly basis, your health care provider may provide home test kits to check for hidden blood in the stool. A small camera at the end of a tube may be used to directly examine the colon (sigmoidoscopy or colonoscopy)  to detect the earliest forms of colorectal cancer. Talk to your health care provider about this at age 60 when routine screening begins. A direct exam of the colon should be repeated every 5-10 years through age 32, unless early forms of precancerous polyps or small growths are found. People who are at an increased risk for hepatitis B should be screened for this virus. You are considered at high risk for hepatitis B if: You were born in a country where hepatitis B occurs often. Talk with your health care provider about which countries are considered high risk. Your parents were born in a high-risk country and you have not  received a shot to protect against hepatitis B (hepatitis B vaccine). You have HIV or AIDS. You use needles to inject street drugs. You live with, or have sex with, someone who has hepatitis B. You are a man who has sex with other men (MSM). You get hemodialysis treatment. You take certain medicines for conditions like cancer, organ transplantation, and autoimmune conditions. Hepatitis C blood testing is recommended for all people born from [birth year] through 1965 and any individual with known risk factors for hepatitis C. Healthy men should no longer receive prostate-specific antigen (PSA) blood tests as part of routine cancer screening. Talk to your health care provider about prostate cancer screening. Testicular cancer screening is not recommended for adolescents or adult males who have no symptoms. Screening includes self-exam, a health care provider exam, and other screening tests. Consult with your health care provider about any symptoms you have or any concerns you have about testicular cancer. Practice safe sex. Use condoms and avoid high-risk sexual practices to reduce the spread of sexually transmitted infections (STIs). You should be screened for STIs, including gonorrhea and chlamydia if: You are sexually active and are younger than 24 years. You are older than 24 years, and your health care provider tells you that you are at risk for this type of infection. Your sexual activity has changed since you were last screened, and you are at an increased risk for chlamydia or gonorrhea. Ask your health care provider if you are at risk. If you are at risk of being infected with HIV, it is recommended that you take a prescription medicine daily to prevent HIV infection. This is called pre-exposure prophylaxis (PrEP). You are considered at risk if: You are a man who has sex with other men (MSM). You are a heterosexual man who is sexually active with multiple partners. You take drugs by injection. You  are sexually active with a partner who has HIV. Talk with your health care provider about whether you are at high risk of being infected with HIV. If you choose to begin PrEP, you should first be tested for HIV. You should then be tested every 3 months for as long as you are taking PrEP. Use sunscreen. Apply sunscreen liberally and repeatedly throughout the day. You should seek shade when your shadow is shorter than you. Protect yourself by wearing long sleeves, pants, a wide-brimmed hat, and sunglasses year round whenever you are outdoors. Tell your health care provider of new moles or changes in moles, especially if there is a change in shape or color. Also, tell your health care provider if a mole is larger than the size of a pencil eraser. Stay current with your vaccines (immunizations).

## 2016-09-19 NOTE — Progress Notes (Signed)
[facility]   Unk Pinto, M.D.    Uvaldo Bristle. Silverio Lay, P.A.-C      Starlyn Skeans, P.A.-C  [facility]                [address] [place] [id number] Telephone [phone] Telefax [phone] Annual  Screening/Preventative Visit  & Comprehensive Evaluation & Examination     This very nice 65 y.o. MWM presents for a Screening/Preventative Visit & comprehensive evaluation and management of multiple medical co-morbidities.  Patient has been followed proactively for labile HTN, Prediabetes, Hyperlipidemia and Vitamin D Deficiency.        Patient also has hx/o elevated PSA from 2012 to 2014 and  Had  bx's which were negative and last year had prostate MRI which was negative for high grade macroscopic cancer or signs of metastatic disease.       Patient has hx/o labile HTN and is monitored expectantly.  Patient's BP has been controlled and today's BP is  120/80. Patient denies any cardiac symptoms as chest pain, palpitations, shortness of breath, dizziness or ankle swelling.     Patient also has hyperlipidemia which he is attempting to control with diet without success.  Last lipids were not at goal: Lab Results  Component Value Date   CHOL 187 04/01/2016   HDL 39 (L) 04/01/2016   LDLCALC 117 04/01/2016   LDLDIRECT 151.3 07/15/2009   TRIG 154 (H) 04/01/2016   CHOLHDL 4.8 04/01/2016      Patient is screened and monitored expectantly for prediabetes and patient denies reactive hypoglycemic symptoms, visual blurring, diabetic polys or paresthesias. Last A1c was  Lab Results  Component Value Date   HGBA1C 5.2 04/01/2016       Finally, patient has history of Vitamin D Deficiency ("42" in Oct 2016)  and last vitamin D was at goal: Lab Results  Component Value Date   VD25OH 60 04/01/2016   Current Outpatient Prescriptions on File Prior to Visit  Medication Sig  . meloxicam (MOBIC) 15 MG tablet  TAKE 1 TAB DAILY.  . Multiple Vitamin  Take by mouth.   No Known Allergies   Past Medical History:  Diagnosis Date  . BPH (benign prostatic hyperplasia)    Health Maintenance  Topic Date Due  . ZOSTAVAX  12/22/2010  . PNA vac Low Risk Adult (1 of 2 - PCV13) 12/23/2015  . INFLUENZA VACCINE  06/22/2016  . TETANUS/TDAP  07/16/2019  . COLONOSCOPY  12/28/2020  . Hepatitis C Screening  Completed  . HIV Screening  Completed   Immunization History  Administered Date(s) Administered  . Influenza Split 10/30/2012, 08/20/2014, 08/25/2015  . Influenza Whole 10/22/2005, 07/19/2010, 08/04/2011  . PPD Test 08/20/2014, 08/25/2015  . Td 11/22/1998, 07/15/2009  . Zoster 08/14/2013   Past Surgical History:  Procedure Laterality Date  . COLONOSCOPY  11-24-2005   STARK-TICS ONLY  . EYE SURGERY Bilateral 2005   lasik  . JOINT REPLACEMENT Left 2007   Lt THR - Dr Ronnie Derby   Family History  Problem Relation Age of Onset  . Heart disease Father   . Cancer Brother     Lung Cancer  . Colon cancer Neg Hx   . Colon polyps Neg Hx   . Esophageal cancer Neg Hx   . Rectal cancer Neg Hx   . Stomach cancer Neg Hx    Social History  Social History  . Marital status: Married    Spouse name: N/A  . Number of children: N/A  . Years of education: N/A   Occupational History  . 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   Social History Main Topics  . Smoking status: Never Smoker  . Smokeless tobacco: Never Used  . Alcohol use No  . Drug use: No  . Sexual activity: Not on file    ROS Constitutional: Denies fever, chills, weight loss/gain, headaches, insomnia,  night sweats or change in appetite. Does c/o fatigue. Eyes: Denies redness, blurred vision, diplopia, discharge, itchy or watery eyes.  ENT: Denies discharge, congestion, post nasal drip, epistaxis, sore throat, earache, hearing loss, dental pain, Tinnitus, Vertigo, Sinus pain or  snoring.  Cardio: Denies chest pain, palpitations, irregular heartbeat, syncope, dyspnea, diaphoresis, orthopnea, PND, claudication or edema Respiratory: denies cough, dyspnea, DOE, pleurisy, hoarseness, laryngitis or wheezing.  Gastrointestinal: Denies dysphagia, heartburn, reflux, water brash, pain, cramps, nausea, vomiting, bloating, diarrhea, constipation, hematemesis, melena, hematochezia, jaundice or hemorrhoids Genitourinary: Denies dysuria, frequency, urgency, nocturia, hesitancy, discharge, hematuria or flank pain Musculoskeletal: Denies arthralgia, myalgia, stiffness, Jt. Swelling, pain, limp or strain/sprain. Denies Falls. Skin: Denies puritis, rash, hives, warts, acne, eczema or change in skin lesion Neuro: No weakness, tremor, incoordination, spasms, paresthesia or pain Psychiatric: Denies confusion, memory loss or sensory loss. Denies Depression. Endocrine: Denies change in weight, skin, hair change, nocturia, and paresthesia, diabetic polys, visual blurring or hyper / hypo glycemic episodes.  Heme/Lymph: No excessive bleeding, bruising or enlarged lymph nodes.  Physical Exam  BP 120/80   Pulse 60   Temp 97.2 F (36.2 C)   Resp 16   Ht 5\' 10"  (1.778 m)   Wt 211 lb 9.6 oz (96 kg)   BMI 30.36 kg/m   General Appearance: Well nourished, in no apparent distress.  Eyes: PERRLA, EOMs, conjunctiva no swelling or erythema, normal fundi and vessels. Sinuses: No frontal/maxillary tenderness ENT/Mouth: EACs patent / TMs  nl. Nares clear without erythema, swelling, mucoid exudates. Oral hygiene is good. No erythema, swelling, or exudate. Tongue normal, non-obstructing. Tonsils not swollen or erythematous. Hearing normal.  Neck: Supple, thyroid normal. No bruits, nodes or JVD. Respiratory: Respiratory effort normal.  BS equal and clear bilateral without rales, rhonci, wheezing or stridor. Cardio: Heart sounds are normal with regular rate and rhythm and no murmurs, rubs or gallops.  Peripheral pulses are normal and equal bilaterally without edema. No aortic or femoral bruits. Chest: symmetric with normal excursions and percussion.  Abdomen: Soft, with Nl bowel sounds. Nontender, no guarding, rebound, hernias, masses, or organomegaly.  Lymphatics: Non tender without lymphadenopathy.  Genitourinary  & DRE - deferred to Dr Diona Fanti Musculoskeletal: Full ROM all peripheral extremities, joint stability, 5/5 strength, and normal gait. Skin: Warm and dry without rashes, lesions, cyanosis, clubbing or  ecchymosis.  Neuro: Cranial nerves intact, reflexes equal bilaterally. Normal muscle tone, no cerebellar symptoms. Sensation intact.  Pysch: Alert and oriented X 3 with normal affect, insight and judgment appropriate.   Assessment and Plan  1. Annual Preventative/Screening Exam   - Microalbumin / creatinine urine ratio - EKG 12-Lead - [place], RETROPERITNL ABD,  LTD - POC Hemoccult Bld/Stl  - Urinalysis, Routine w reflex microscopic  - PSA - CBC with Differential/Platelet - BASIC METABOLIC PANEL WITH GFR - Hepatic function panel - Magnesium - Lipid panel - TSH - Hemoglobin A1c - Insulin, random - VITAMIN D 25 Hydroxy   2.  Labile secondary hypertension  - Microalbumin / creatinine urine ratio - EKG 12-Lead - [place], RETROPERITNL ABD,  LTD - TSH  3. Hyperlipidemia  - Lipid panel - TSH  4. Other abnormal glucose  - Hemoglobin A1c - Insulin, random  5. Vitamin D deficiency  - VITAMIN D 25 Hydroxy   6. Screening for rectal cancer  - POC Hemoccult Bld/Stl  7. Prostate cancer screening  - PSA  8. Elevated PSA  - PSA  9. Screening examination for pulmonary tuberculosis   10. Screening for ischemic heart disease   11. Screening for AAA (aortic abdominal aneurysm)   12. Medication management  - Urinalysis, Routine w reflex microscopic  - CBC with Differential/Platelet - BASIC METABOLIC PANEL WITH GFR - Hepatic function panel - Magnesium  13.  Need for prophylactic vaccination and inoculation against influenza  - Flu vaccine HIGH DOSE PF (Fluzone High dose)       Continue prudent diet as discussed, weight control, BP monitoring, regular exercise, and medications as discussed.  Discussed med effects and SE's. Routine screening labs and tests as requested with regular follow-up as recommended. Over 40 minutes of exam, counseling, chart review and high complex critical decision making was performed

## 2016-09-20 ENCOUNTER — Encounter: Payer: Self-pay | Admitting: Internal Medicine

## 2016-09-20 ENCOUNTER — Ambulatory Visit (INDEPENDENT_AMBULATORY_CARE_PROVIDER_SITE_OTHER): Payer: Medicare Other | Admitting: Internal Medicine

## 2016-09-20 VITALS — BP 120/80 | HR 60 | Temp 97.2°F | Resp 16 | Ht 70.0 in | Wt 211.6 lb

## 2016-09-20 DIAGNOSIS — R972 Elevated prostate specific antigen [PSA]: Secondary | ICD-10-CM

## 2016-09-20 DIAGNOSIS — R7309 Other abnormal glucose: Secondary | ICD-10-CM

## 2016-09-20 DIAGNOSIS — Z136 Encounter for screening for cardiovascular disorders: Secondary | ICD-10-CM | POA: Diagnosis not present

## 2016-09-20 DIAGNOSIS — Z1212 Encounter for screening for malignant neoplasm of rectum: Secondary | ICD-10-CM

## 2016-09-20 DIAGNOSIS — Z79899 Other long term (current) drug therapy: Secondary | ICD-10-CM

## 2016-09-20 DIAGNOSIS — Z125 Encounter for screening for malignant neoplasm of prostate: Secondary | ICD-10-CM

## 2016-09-20 DIAGNOSIS — Z0001 Encounter for general adult medical examination with abnormal findings: Secondary | ICD-10-CM

## 2016-09-20 DIAGNOSIS — Z0000 Encounter for general adult medical examination without abnormal findings: Secondary | ICD-10-CM

## 2016-09-20 DIAGNOSIS — E559 Vitamin D deficiency, unspecified: Secondary | ICD-10-CM

## 2016-09-20 DIAGNOSIS — Z23 Encounter for immunization: Secondary | ICD-10-CM

## 2016-09-20 DIAGNOSIS — I10 Essential (primary) hypertension: Secondary | ICD-10-CM

## 2016-09-20 DIAGNOSIS — E782 Mixed hyperlipidemia: Secondary | ICD-10-CM

## 2016-09-20 DIAGNOSIS — Z111 Encounter for screening for respiratory tuberculosis: Secondary | ICD-10-CM

## 2016-09-20 DIAGNOSIS — I159 Secondary hypertension, unspecified: Secondary | ICD-10-CM

## 2016-09-20 LAB — CBC WITH DIFFERENTIAL/PLATELET
BASOS ABS: 50 {cells}/uL (ref 0–200)
Basophils Relative: 1 %
EOS ABS: 100 {cells}/uL (ref 15–500)
EOS PCT: 2 %
HCT: 43.3 % (ref 38.5–50.0)
Hemoglobin: 15.2 g/dL (ref 13.2–17.1)
Lymphocytes Relative: 36 %
Lymphs Abs: 1800 cells/uL (ref 850–3900)
MCH: 32.2 pg (ref 27.0–33.0)
MCHC: 35.1 g/dL (ref 32.0–36.0)
MCV: 91.7 fL (ref 80.0–100.0)
MONOS PCT: 12 %
MPV: 10.1 fL (ref 7.5–12.5)
Monocytes Absolute: 600 cells/uL (ref 200–950)
NEUTROS ABS: 2450 {cells}/uL (ref 1500–7800)
NEUTROS PCT: 49 %
PLATELETS: 238 10*3/uL (ref 140–400)
RBC: 4.72 MIL/uL (ref 4.20–5.80)
RDW: 13.2 % (ref 11.0–15.0)
WBC: 5 10*3/uL (ref 3.8–10.8)

## 2016-09-20 LAB — HEPATIC FUNCTION PANEL
ALBUMIN: 4.4 g/dL (ref 3.6–5.1)
ALT: 23 U/L (ref 9–46)
AST: 21 U/L (ref 10–35)
Alkaline Phosphatase: 48 U/L (ref 40–115)
BILIRUBIN TOTAL: 0.5 mg/dL (ref 0.2–1.2)
Bilirubin, Direct: 0.1 mg/dL (ref <=0.2)
Indirect Bilirubin: 0.4 mg/dL (ref 0.2–1.2)
Total Protein: 7 g/dL (ref 6.1–8.1)

## 2016-09-20 LAB — BASIC METABOLIC PANEL WITH GFR
BUN: 14 mg/dL (ref 7–25)
CALCIUM: 10.1 mg/dL (ref 8.6–10.3)
CHLORIDE: 103 mmol/L (ref 98–110)
CO2: 25 mmol/L (ref 20–31)
CREATININE: 1.18 mg/dL (ref 0.70–1.25)
GFR, Est African American: 74 mL/min (ref >=60)
GFR, Est Non African American: 64 mL/min (ref >=60)
Glucose, Bld: 81 mg/dL (ref 65–99)
Potassium: 5.1 mmol/L (ref 3.5–5.3)
Sodium: 139 mmol/L (ref 135–146)

## 2016-09-20 LAB — LIPID PANEL
CHOLESTEROL: 200 mg/dL (ref 125–200)
HDL: 36 mg/dL — ABNORMAL LOW (ref >=40)
LDL Cholesterol: 123 mg/dL (ref <130)
Total CHOL/HDL Ratio: 5.6 Ratio — ABNORMAL HIGH (ref <=5.0)
Triglycerides: 207 mg/dL — ABNORMAL HIGH (ref <150)
VLDL: 41 mg/dL — ABNORMAL HIGH (ref <30)

## 2016-09-20 LAB — TSH: TSH: 1.75 m[IU]/L (ref 0.40–4.50)

## 2016-09-20 LAB — MAGNESIUM: MAGNESIUM: 2.2 mg/dL (ref 1.5–2.5)

## 2016-09-20 LAB — PSA: PSA: 2.9 ng/mL (ref <=4.0)

## 2016-09-21 LAB — URINALYSIS, ROUTINE W REFLEX MICROSCOPIC
BILIRUBIN URINE: NEGATIVE
Glucose, UA: NEGATIVE
HGB URINE DIPSTICK: NEGATIVE
KETONES UR: NEGATIVE
Leukocytes, UA: NEGATIVE
Nitrite: NEGATIVE
PROTEIN: NEGATIVE
Specific Gravity, Urine: 1.015 (ref 1.001–1.035)
pH: 7.5 (ref 5.0–8.0)

## 2016-09-21 LAB — HEMOGLOBIN A1C
HEMOGLOBIN A1C: 5 % (ref <5.7)
Mean Plasma Glucose: 97 mg/dL

## 2016-09-21 LAB — INSULIN, RANDOM: INSULIN: 7.9 u[IU]/mL (ref 2.0–19.6)

## 2016-09-21 LAB — VITAMIN D 25 HYDROXY (VIT D DEFICIENCY, FRACTURES): VIT D 25 HYDROXY: 58 ng/mL (ref 30–100)

## 2016-09-21 LAB — MICROALBUMIN / CREATININE URINE RATIO
Creatinine, Urine: 110 mg/dL (ref 20–370)
MICROALB UR: 0.2 mg/dL
MICROALB/CREAT RATIO: 2 ug/mg{creat} (ref <30)

## 2016-09-30 ENCOUNTER — Ambulatory Visit: Payer: Self-pay | Admitting: Internal Medicine

## 2016-10-04 ENCOUNTER — Ambulatory Visit (INDEPENDENT_AMBULATORY_CARE_PROVIDER_SITE_OTHER): Payer: Medicare Other | Admitting: Internal Medicine

## 2016-10-04 ENCOUNTER — Encounter: Payer: Self-pay | Admitting: Internal Medicine

## 2016-10-04 VITALS — BP 142/80 | HR 72 | Temp 97.5°F | Resp 16 | Wt 214.0 lb

## 2016-10-04 DIAGNOSIS — M1991 Primary osteoarthritis, unspecified site: Secondary | ICD-10-CM | POA: Diagnosis not present

## 2016-10-04 DIAGNOSIS — H02409 Unspecified ptosis of unspecified eyelid: Secondary | ICD-10-CM | POA: Diagnosis not present

## 2016-10-04 DIAGNOSIS — Z0001 Encounter for general adult medical examination with abnormal findings: Secondary | ICD-10-CM

## 2016-10-04 DIAGNOSIS — E782 Mixed hyperlipidemia: Secondary | ICD-10-CM | POA: Diagnosis not present

## 2016-10-04 DIAGNOSIS — H02839 Dermatochalasis of unspecified eye, unspecified eyelid: Secondary | ICD-10-CM

## 2016-10-04 DIAGNOSIS — Z23 Encounter for immunization: Secondary | ICD-10-CM

## 2016-10-04 DIAGNOSIS — Z0000 Encounter for general adult medical examination without abnormal findings: Secondary | ICD-10-CM

## 2016-10-04 DIAGNOSIS — Z79899 Other long term (current) drug therapy: Secondary | ICD-10-CM

## 2016-10-04 DIAGNOSIS — R6889 Other general symptoms and signs: Secondary | ICD-10-CM | POA: Diagnosis not present

## 2016-10-04 DIAGNOSIS — E559 Vitamin D deficiency, unspecified: Secondary | ICD-10-CM | POA: Diagnosis not present

## 2016-10-04 DIAGNOSIS — Z6830 Body mass index (BMI) 30.0-30.9, adult: Secondary | ICD-10-CM | POA: Diagnosis not present

## 2016-10-04 DIAGNOSIS — I159 Secondary hypertension, unspecified: Secondary | ICD-10-CM | POA: Diagnosis not present

## 2016-10-04 NOTE — Progress Notes (Signed)
WELCOME TO MEDICARE ANNUAL WELLNESS VISIT AND FOLLOW UP Assessment:    1. Need for prophylactic vaccination against Streptococcus pneumoniae (pneumococcus)  - Pneumococcal conjugate vaccine 13-valent IM  2. Labile secondary hypertension -cont to monitor -slightly elevated -not on medication currently -dash diet -exercise as tolerated  3. Primary osteoarthritis, unspecified site -cont meloxicam -followed by ortho  4. Ptosis of eyelid, unspecified laterality -seen by opthalmology  5. BMI 30.0-30.9,adult -cont diet and exercise  6. Dermatochalasis, unspecified laterality -followed by opthalmology  7. Hyperlipidemia -cont diet and exercise -low fat diet and avoiding animal meats and dairy recommended  8. Medication management   9. Morbid obesity (30.69) -cont diet and exercise  10. Vitamin D deficiency -cont Vit D  11.  Encounter for medicare -due next year       Over 30 minutes of exam, counseling, chart review, and critical decision making was performed  Future Appointments Date Time Provider [place]  10/20/2017 9:00 AM Unk Pinto, MD [facility] None     Plan:   During the course of the visit the patient was educated and counseled about appropriate screening and preventive services including:    Pneumococcal vaccine   Influenza vaccine  Prevnar 13  Td vaccine  Screening electrocardiogram  Colorectal cancer screening  Diabetes screening  Glaucoma screening  Nutrition counseling    Subjective:  Troy [name] is a 65 y.o. male who presents for Medicare Annual Wellness Visit  His blood pressure has been controlled at home, today their BP is BP: (!) 142/80 He does workout. He denies chest pain, shortness of breath, dizziness.  He does [employer] several days a week for an hour.  He and his wife are very active.     He is not on cholesterol medication and denies myalgias. His cholesterol is not at goal. The  cholesterol last visit was:  Lab Results  Component Value Date   CHOL 200 09/20/2016   HDL 36 (L) 09/20/2016   LDLCALC 123 09/20/2016   LDLDIRECT 151.3 07/15/2009   TRIG 207 (H) 09/20/2016   CHOLHDL 5.6 (H) 09/20/2016   He has no history of prediabetes or diabetes.   Lab Results  Component Value Date   HGBA1C 5.0 09/20/2016    Last GFR Lab Results  Component Value Date   GFRNONAA 64 09/20/2016     Lab Results  Component Value Date   GFRAA 74 09/20/2016   Patient is on Vitamin D supplement.   Lab Results  Component Value Date   VD25OH 58 09/20/2016      Medication Review: Current Outpatient Prescriptions on File Prior to Visit  Medication Sig Dispense Refill  . finasteride (PROSCAR) 5 MG tablet Take 5 mg by mouth daily.    . meloxicam (MOBIC) 15 MG tablet TAKE 1 TABLET (15 MG TOTAL) BY MOUTH DAILY. 90 tablet 0  . Multiple Vitamin (MULTIVITAMIN) capsule Take by mouth.     No current facility-administered medications on file prior to visit.     Allergies: No Known Allergies  Current Problems (verified) has OSTEOARTHRITIS; Hyperlipidemia; Vitamin D deficiency; Other abnormal glucose; Chalastodermia; Blepharoptosis; BMI 30.0-30.9,adult; Morbid obesity (30.69); Labile secondary hypertension; Elevated PSA; and Medication management on his problem list.  Screening Tests Immunization History  Administered Date(s) Administered  . Influenza Split 10/30/2012, 08/20/2014, 08/25/2015  . Influenza Whole 10/22/2005, 07/19/2010, 08/04/2011  . Influenza, High Dose Seasonal PF 09/20/2016  . PPD Test 08/20/2014, 08/25/2015  . Pneumococcal Conjugate-13 10/04/2016  . Td 11/22/1998, 07/15/2009  . Zoster  08/14/2013    Preventative care: Last colonoscopy: 2017  Prior vaccinations: TD or Tdap: 2010  Influenza: 2017 Pneumococcal: 2017 Prevnar13: 2017 Shingles/Zostavax: 2014  Names of Other Physician/Practitioners you currently use: 1. [facility] here for primary care 2. Does not see, eye doctor, last visit 4years ago 3. Dr. Orvil Feil, dentist, last visit 2017 Patient Care Team: Unk Pinto, MD as PCP - General (Internal Medicine)  Surgical: He  has a past surgical history that includes Eye surgery (Bilateral, 2005); Joint replacement (Left, 2007); and Colonoscopy (11-24-2005). Family His family history includes Cancer in his brother; Heart disease in his father. Social history  He reports that he has never smoked. He has never used smokeless tobacco. He reports that he does not drink alcohol or use drugs.  MEDICARE WELLNESS OBJECTIVES: Physical activity:   Cardiac risk factors:   Depression/mood screen:   Depression screen [facility] 2/9 09/19/2016  Decreased Interest 0  Down, Depressed, Hopeless 0  PHQ - 2 Score 0    ADLs:  In your present state of health, do you have any difficulty performing the following activities: 09/19/2016 04/01/2016  Hearing? N N  Vision? N N  Difficulty concentrating or making decisions? N N  Walking or climbing stairs? N N  Dressing or bathing? N N  Doing errands, shopping? N N  Some recent data might be hidden     Cognitive Testing  Alert? Yes  Normal Appearance?Yes  Oriented to person? Yes  Place? Yes   Time? Yes  Recall of three objects?  Yes  Can perform simple calculations? Yes  Displays appropriate judgment?Yes  Can read the correct time from a watch face?Yes  EOL planning:     Objective:   Today's Vitals   10/04/16 1607  BP: (!) 142/80  Pulse: 72  Resp: 16  Temp: 97.5 F (36.4 C)  SpO2: 97%  Weight: 214 lb (97.1 kg)   Body mass index is 30.71 kg/m.  General appearance: alert, no distress, WD/WN, male HEENT: normocephalic, sclerae anicteric, TMs pearly, nares patent, no discharge or erythema, pharynx normal Oral cavity: MMM, no lesions Neck: supple, no lymphadenopathy, no thyromegaly, no masses Heart: RRR, normal S1, S2, no murmurs Lungs: CTA  bilaterally, no wheezes, rhonchi, or rales Abdomen: +bs, soft, non tender, non distended, no masses, no hepatomegaly, no splenomegaly Musculoskeletal: nontender, no swelling, no obvious deformity Extremities: no edema, no cyanosis, no clubbing Pulses: 2+ symmetric, upper and lower extremities, normal cap refill Neurological: alert, oriented x 3, CN2-12 intact, strength normal upper extremities and lower extremities, sensation normal throughout, DTRs 2+ throughout, no cerebellar signs, gait normal Psychiatric: normal affect, behavior normal, pleasant   Medicare Attestation I have personally reviewed: The patient's medical and social history Their use of alcohol, tobacco or illicit drugs Their current medications and supplements The patient's functional ability including ADLs,fall risks, home safety risks, cognitive, and hearing and visual impairment Diet and physical activities Evidence for depression or mood disorders  The patient's weight, height, BMI, and visual acuity have been recorded in the chart.  I have made referrals, counseling, and provided education to the patient based on review of the above and I have provided the patient with a written personalized care plan for preventive services.     Starlyn Skeans, PA-C   10/04/2016

## 2016-12-15 ENCOUNTER — Other Ambulatory Visit: Payer: Self-pay | Admitting: *Deleted

## 2016-12-15 MED ORDER — SILDENAFIL CITRATE 20 MG PO TABS: ORAL_TABLET | ORAL | 0 refills | Status: DC

## 2017-04-08 ENCOUNTER — Other Ambulatory Visit: Payer: Self-pay | Admitting: Internal Medicine

## 2017-05-15 ENCOUNTER — Other Ambulatory Visit: Payer: Self-pay | Admitting: Internal Medicine

## 2017-06-06 ENCOUNTER — Ambulatory Visit (INDEPENDENT_AMBULATORY_CARE_PROVIDER_SITE_OTHER): Payer: Medicare Other | Admitting: Podiatry

## 2017-06-06 DIAGNOSIS — B351 Tinea unguium: Secondary | ICD-10-CM

## 2017-06-12 NOTE — Progress Notes (Signed)
   SUBJECTIVE 65 year old male presents the office today for evaluation of bilateral great toenails that are thick and discolored. Patient states that the right is worse than the left. He believes of the right toenail is loose. Patient denies trauma. This is been ongoing for several months.  OBJECTIVE General Patient is awake, alert, and oriented x 3 and in no acute distress. Derm Skin is dry and supple bilateral. Negative open lesions or macerations. Remaining integument unremarkable. Nails are tender, long, thickened and dystrophic with subungual debris, consistent with onychomycosis, bilateral great toes. No signs of infection noted. Vasc  DP and PT pedal pulses palpable bilaterally. Temperature gradient within normal limits.  Neuro Epicritic and protective threshold sensation diminished bilaterally.  Musculoskeletal Exam No symptomatic pedal deformities noted bilateral. Muscular strength within normal limits.  ASSESSMENT 1. Dystrophic toenails bilateral great toes  PLAN OF CARE 1. Patient evaluated today.  2. Today nail biopsy was performed of the bilateral great toes and sent to pathology for fungal culture. Discussed with the patient the different etiologies reviewed relating to dystrophic toenails. 3. Return to clinic in 3 weeks to discuss pathology results and discuss different treatment options   Edrick Kins, DPM [facility]  Dr. Edrick Kins, [address]                                        [place], [place] [address]                Office [phone]  Fax [phone]

## 2017-06-23 ENCOUNTER — Encounter: Payer: Self-pay | Admitting: Internal Medicine

## 2017-07-04 ENCOUNTER — Encounter: Payer: Self-pay | Admitting: Podiatry

## 2017-07-04 ENCOUNTER — Ambulatory Visit (INDEPENDENT_AMBULATORY_CARE_PROVIDER_SITE_OTHER): Payer: Medicare Other | Admitting: Podiatry

## 2017-07-04 DIAGNOSIS — B351 Tinea unguium: Secondary | ICD-10-CM | POA: Diagnosis not present

## 2017-07-04 DIAGNOSIS — Z79899 Other long term (current) drug therapy: Secondary | ICD-10-CM | POA: Diagnosis not present

## 2017-07-04 LAB — HEPATIC FUNCTION PANEL
ALT: 24 U/L (ref 9–46)
AST: 21 U/L (ref 10–35)
Albumin: 4.5 g/dL (ref 3.6–5.1)
Alkaline Phosphatase: 54 U/L (ref 40–115)
BILIRUBIN DIRECT: 0.1 mg/dL (ref <=0.2)
BILIRUBIN INDIRECT: 0.2 mg/dL (ref 0.2–1.2)
BILIRUBIN TOTAL: 0.3 mg/dL (ref 0.2–1.2)
Total Protein: 7 g/dL (ref 6.1–8.1)

## 2017-07-04 MED ORDER — TERBINAFINE HCL 250 MG PO TABS: 250.0000 mg | ORAL_TABLET | Freq: Every day | ORAL | 0 refills | Status: DC

## 2017-07-04 NOTE — Progress Notes (Signed)
   Subjective: Patient presents today for follow-up treatment and evaluation regarding dystrophic toenails to the bilateral great toes. Last visit nail biopsy was performed and sent to pathology for fungal culture. Patient states there is been no change in toenails since last visit. Patient presents today to review biopsy results and discuss further treatment options  Objective: Physical Exam General: The patient is alert and oriented x3 in no acute distress.  Dermatology: Hyperkeratotic, discolored, thickened, onychodystrophy of nails noted bilaterally.  Skin is warm, dry and supple bilateral lower extremities. Negative for open lesions or macerations.  Vascular: Palpable pedal pulses bilaterally. No edema or erythema noted. Capillary refill within normal limits.  Neurological: Epicritic and protective threshold grossly intact bilaterally.   Musculoskeletal Exam: Range of motion within normal limits to all pedal and ankle joints bilateral. Muscle strength 5/5 in all groups bilateral.   Assessment: #1 onychodystrophy bilateral great toenails #2 onychomycosis   Plan of Care:  #1 Patient was evaluated. #2 Orders for liver function tests were ordered today.  #3 prescription for terbinafine 250 mg #90 #4 prescription for antifungal nail lacquer through [facility] #5 today were going to schedule an appointment with Janett Billow for antifungal laser treatment #6 return to clinic in 6 months   Edrick Kins, DPM [facility]  Dr. Edrick Kins, [address]                                        [place], [place] [address]                Office [phone]  Fax [phone]

## 2017-07-18 ENCOUNTER — Ambulatory Visit: Payer: Medicare Other

## 2017-07-19 ENCOUNTER — Ambulatory Visit: Payer: Medicare Other

## 2017-07-21 ENCOUNTER — Ambulatory Visit: Payer: Medicare Other

## 2017-07-26 ENCOUNTER — Other Ambulatory Visit: Payer: Self-pay | Admitting: Physician Assistant

## 2017-08-08 ENCOUNTER — Ambulatory Visit: Payer: Self-pay | Admitting: Podiatry

## 2017-08-08 DIAGNOSIS — B351 Tinea unguium: Secondary | ICD-10-CM

## 2017-08-08 NOTE — Progress Notes (Signed)
Pt presents with mycotic infection of nails 1-5 bilateral  All other systems are negative  Laser therapy administered to affected nails and tolerated well. All safety precautions were in place. Re-appointed in 4 weeks for 2nd patient

## 2017-09-05 ENCOUNTER — Other Ambulatory Visit: Payer: Medicare Other

## 2017-09-09 ENCOUNTER — Ambulatory Visit: Payer: Self-pay

## 2017-09-09 DIAGNOSIS — B351 Tinea unguium: Secondary | ICD-10-CM

## 2017-09-12 NOTE — Progress Notes (Signed)
Pt presents with mycotic infection of nails 1-5 bilateral  All other systems are negative  Laser therapy administered to affected nails and tolerated well. All safety precautions were in place. Re-appointed in 4 weeks for 3rd treatment 

## 2017-10-04 ENCOUNTER — Telehealth: Payer: Self-pay | Admitting: *Deleted

## 2017-10-04 NOTE — Telephone Encounter (Signed)
Refill request for Terbinafine. Pt has received therapeutic dosing of #90, it will take 6-9 months for pt to see healthy out growth. Return fax denying.

## 2017-10-11 ENCOUNTER — Telehealth: Payer: Self-pay | Admitting: *Deleted

## 2017-10-11 NOTE — Telephone Encounter (Signed)
Refill request terbinafine. Pt has received 90 therapeutic dosing. Return fax denying.

## 2017-10-17 ENCOUNTER — Ambulatory Visit: Payer: Medicare Other

## 2017-10-18 ENCOUNTER — Telehealth: Payer: Self-pay | Admitting: *Deleted

## 2017-10-18 NOTE — Telephone Encounter (Signed)
Refill request for Terbinafine denied, returned fax.

## 2017-10-19 NOTE — Patient Instructions (Signed)

## 2017-10-19 NOTE — Progress Notes (Signed)
[facility]   Unk Pinto, M.D.     Uvaldo Bristle. Silverio Lay, P.A.-C Liane Comber, [place]                [address] [place], [place] [address] Telephone [phone] Telefax [phone] Annual  Screening/Preventative Visit  & Comprehensive Evaluation & Examination     This very nice 66 y.o. MWM presents for a Screening/Preventative Visit & comprehensive evaluation and management of multiple medical co-morbidities.  Patient has been followed expectantly for labile HTN, Prediabetes, Hyperlipidemia and Vitamin D Deficiency. In 2012 & 2014 he had Negative Prostate bx's for Elevated PSA's and Prostate MRI was also negative. Had recent PSA w/Dr Dahlstedt.     Patient does have hx/o labile HTN pand is followed expectantly. Patient's BP has been controlled at home.  Today's BP is at goal -130/76. Patient denies any cardiac symptoms as chest pain, palpitations, shortness of breath, dizziness or ankle swelling.     Patient's hyperlipidemia is not controlled with diet.  Last lipids were not at goal: Lab Results  Component Value Date   CHOL 200 09/20/2016   HDL 36 (L) 09/20/2016   LDLCALC 123 09/20/2016   TRIG 207 (H) 09/20/2016   CHOLHDL 5.6 (H) 09/20/2016      Patient has Obesity (BMI 30+) and is proactively/expectantly screened for prediabetes and patient denies reactive hypoglycemic symptoms, visual blurring, diabetic polys or paresthesias. Last A1c was normal & at goal: Lab Results  Component Value Date   HGBA1C 5.0 09/20/2016       Finally, patient has history of Vitamin D Deficiency ("42" / 2016)  and last vitamin D was near goal: Lab Results  Component Value Date   VD25OH 58 09/20/2016   Current Outpatient Medications on File Prior to Visit  Medication Sig  . finasteride (PROSCAR) 5 MG tablet TAKE 1 TABLET DAILY FOR PROSTATE  . meloxicam (MOBIC) 15 MG tablet TAKE ONE TABLET BY MOUTH DAILY   . Multiple Vitamin (MULTIVITAMIN) capsule Take by mouth.  . sildenafil (REVATIO) 20 MG tablet TAKE 2-5 TABLETS DAILY AS NEEDED   No current facility-administered medications on file prior to visit.    No Known Allergies Past Medical History:  Diagnosis Date  . BPH (benign prostatic hyperplasia)    Health Maintenance  Topic Date Due  . PNA vac Low Risk Adult (2 of 2 - PPSV23) 10/04/2017  . TETANUS/TDAP  07/16/2019  . COLONOSCOPY  12/28/2020  . INFLUENZA VACCINE  Completed  . Hepatitis C Screening  Completed   Immunization History  Administered Date(s) Administered  . Influenza Split 10/30/2012, 08/20/2014, 08/25/2015  . Influenza Whole 10/22/2005, 07/19/2010, 08/04/2011  . Influenza, High Dose Seasonal PF 09/20/2016, 10/20/2017  . PPD Test 08/20/2014, 08/25/2015  . Pneumococcal Conjugate-13 10/04/2016  . Td 11/22/1998, 07/15/2009  . Zoster 08/14/2013   Past Surgical History:  Procedure Laterality Date  . COLONOSCOPY  11-24-2005   STARK-TICS ONLY  . EYE SURGERY Bilateral 2005   lasik  . JOINT REPLACEMENT Left 2007   Lt THR - Dr Ronnie Derby   Family History  Problem Relation Age of Onset  . Heart disease Father   . Cancer Brother        Lung Cancer  . Colon cancer Neg Hx   . Colon polyps Neg Hx   . Esophageal cancer Neg Hx   . Rectal cancer Neg  Hx   . Stomach cancer Neg Hx    Social History   Socioeconomic History  . Marital status: Married    Spouse name: Not on file  Occupational History  . Not on file  Tobacco Use  . Smoking status: Never Smoker  . Smokeless tobacco: Never Used  Substance and Sexual Activity  . Alcohol use: No    Alcohol/week: 0.0 oz  . Drug use: No  . Sexual activity: Not on file    ROS Constitutional: Denies fever, chills, weight loss/gain, headaches, insomnia,  night sweats or change in appetite. Does c/o fatigue. Eyes: Denies redness, blurred vision, diplopia, discharge, itchy or watery eyes.  ENT: Denies discharge, congestion, post  nasal drip, epistaxis, sore throat, earache, hearing loss, dental pain, Tinnitus, Vertigo, Sinus pain or snoring.  Cardio: Denies chest pain, palpitations, irregular heartbeat, syncope, dyspnea, diaphoresis, orthopnea, PND, claudication or edema Respiratory: denies cough, dyspnea, DOE, pleurisy, hoarseness, laryngitis or wheezing.  Gastrointestinal: Denies dysphagia, heartburn, reflux, water brash, pain, cramps, nausea, vomiting, bloating, diarrhea, constipation, hematemesis, melena, hematochezia, jaundice or hemorrhoids Genitourinary: Denies dysuria, urgency, hesitancy, discharge, hematuria or flank pain. Has nocturia x 2-3, and occas  Frequency. Musculoskeletal: Denies arthralgia, myalgia, stiffness, Jt. Swelling, pain, limp or strain/sprain. Denies Falls. Skin: Denies puritis, rash, hives, warts, acne, eczema or change in skin lesion Neuro: No weakness, tremor, incoordination, spasms, paresthesia or pain Psychiatric: Denies confusion, memory loss or sensory loss. Denies Depression. Endocrine: Denies change in weight, skin, hair change, nocturia, and paresthesia, diabetic polys, visual blurring or hyper / hypo glycemic episodes.  Heme/Lymph: No excessive bleeding, bruising or enlarged lymph nodes.  Physical Exam  BP 130/76   Pulse 72   Temp (!) 97.5 F (36.4 C)   Resp 16   Ht 5\' 10"  (1.778 m)   Wt 204 lb 9.6 oz (92.8 kg)   BMI 29.36 kg/m   General Appearance: Well nourished and well groomed and in no apparent distress.  Eyes: PERRLA, EOMs, conjunctiva no swelling or erythema, normal fundi and vessels. Sinuses: No frontal/maxillary tenderness ENT/Mouth: EACs patent / TMs  nl. Nares clear without erythema, swelling, mucoid exudates. Oral hygiene is good. No erythema, swelling, or exudate. Tongue normal, non-obstructing. Tonsils not swollen or erythematous. Hearing normal.  Neck: Supple, thyroid normal. No bruits, nodes or JVD. Respiratory: Respiratory effort normal.  BS equal and clear  bilateral without rales, rhonci, wheezing or stridor. Cardio: Heart sounds are normal with regular rate and rhythm and no murmurs, rubs or gallops. Peripheral pulses are normal and equal bilaterally without edema. No aortic or femoral bruits. Chest: symmetric with normal excursions and percussion.  Abdomen: Soft, with Nl bowel sounds. Nontender, no guarding, rebound, hernias, masses, or organomegaly.  Lymphatics: Non tender without lymphadenopathy.  Genitourinary: No hernias.Testes nl. DRE - prostate nl for age - smooth & firm w/o nodules. Musculoskeletal: Full ROM all peripheral extremities, joint stability, 5/5 strength, and normal gait. Skin: Warm and dry without rashes, lesions, cyanosis, clubbing or  ecchymosis.  Neuro: Cranial nerves intact, reflexes equal bilaterally. Normal muscle tone, no cerebellar symptoms. Sensation intact.  Pysch: Alert and oriented X 3 with normal affect, insight and judgment appropriate.   Assessment and Plan  1. Annual Preventative/Screening Exam   2. Labile hypertension  - EKG 12-Lead - [place], RETROPERITNL ABD,  LTD - Urinalysis, Routine w reflex microscopic - Microalbumin / creatinine urine ratio - CBC with Differential/Platelet - BASIC METABOLIC PANEL WITH GFR - Magnesium - TSH  3. Hyperlipidemia, mixed  - EKG  12-Lead - [place], RETROPERITNL ABD,  LTD - Hepatic function panel - Lipid panel - TSH  4. Abnormal glucose  - EKG 12-Lead - [place], RETROPERITNL ABD,  LTD - Hemoglobin A1c - Insulin, random  5. Vitamin D deficiency  - VITAMIN D 25 Hydroxy   6. Screening for colorectal cancer  - POC Hemoccult Bld/Stl   7. Elevated PSA   8. Benign localized prostatic hyperplasia with lower urinary tract symptoms (LUTS)   9. Screening for ischemic heart disease  - EKG 12-Lead  10. Screening for AAA (aortic abdominal aneurysm)  - [place], RETROPERITNL ABD,  LTD  11. Need for immunization against influenza  - Flu vaccine HIGH DOSE PF (Fluzone High  dose)  12. Medication management  - Urinalysis, Routine w reflex microscopic - Microalbumin / creatinine urine ratio - BASIC METABOLIC PANEL WITH GFR - Hepatic function panel - Magnesium - Lipid panel - TSH - Hemoglobin A1c - Insulin, random - VITAMIN D 25 Hydroxy       Patient was counseled in prudent diet, weight control to achieve/maintain BMI less than 25, BP monitoring, regular exercise and medications as discussed.  Discussed med effects and SE's. Routine screening labs and tests as requested with regular follow-up as recommended. Over 40 minutes of exam, counseling, chart review and high complex critical decision making was performed

## 2017-10-20 ENCOUNTER — Encounter: Payer: Self-pay | Admitting: Internal Medicine

## 2017-10-20 ENCOUNTER — Ambulatory Visit: Payer: Medicare Other | Admitting: Internal Medicine

## 2017-10-20 VITALS — BP 130/76 | HR 72 | Temp 97.5°F | Resp 16 | Ht 70.0 in | Wt 204.6 lb

## 2017-10-20 DIAGNOSIS — Z23 Encounter for immunization: Secondary | ICD-10-CM

## 2017-10-20 DIAGNOSIS — Z136 Encounter for screening for cardiovascular disorders: Secondary | ICD-10-CM | POA: Diagnosis not present

## 2017-10-20 DIAGNOSIS — E782 Mixed hyperlipidemia: Secondary | ICD-10-CM

## 2017-10-20 DIAGNOSIS — Z0000 Encounter for general adult medical examination without abnormal findings: Secondary | ICD-10-CM

## 2017-10-20 DIAGNOSIS — N401 Enlarged prostate with lower urinary tract symptoms: Secondary | ICD-10-CM

## 2017-10-20 DIAGNOSIS — I10 Essential (primary) hypertension: Secondary | ICD-10-CM | POA: Diagnosis not present

## 2017-10-20 DIAGNOSIS — R0989 Other specified symptoms and signs involving the circulatory and respiratory systems: Secondary | ICD-10-CM

## 2017-10-20 DIAGNOSIS — Z1211 Encounter for screening for malignant neoplasm of colon: Secondary | ICD-10-CM

## 2017-10-20 DIAGNOSIS — E559 Vitamin D deficiency, unspecified: Secondary | ICD-10-CM

## 2017-10-20 DIAGNOSIS — Z79899 Other long term (current) drug therapy: Secondary | ICD-10-CM

## 2017-10-20 DIAGNOSIS — Z0001 Encounter for general adult medical examination with abnormal findings: Secondary | ICD-10-CM

## 2017-10-20 DIAGNOSIS — R972 Elevated prostate specific antigen [PSA]: Secondary | ICD-10-CM

## 2017-10-20 DIAGNOSIS — R7309 Other abnormal glucose: Secondary | ICD-10-CM

## 2017-10-20 DIAGNOSIS — Z1212 Encounter for screening for malignant neoplasm of rectum: Secondary | ICD-10-CM

## 2017-10-21 ENCOUNTER — Encounter (INDEPENDENT_AMBULATORY_CARE_PROVIDER_SITE_OTHER): Payer: Self-pay

## 2017-10-21 LAB — MICROALBUMIN / CREATININE URINE RATIO
Creatinine, Urine: 146 mg/dL (ref 20–320)
Microalb Creat Ratio: 3 mcg/mg creat (ref <30)
Microalb, Ur: 0.4 mg/dL

## 2017-10-21 LAB — CBC WITH DIFFERENTIAL/PLATELET
BASOS PCT: 0.5 %
Basophils Absolute: 28 cells/uL (ref 0–200)
EOS ABS: 61 {cells}/uL (ref 15–500)
EOS PCT: 1.1 %
HCT: 43.5 % (ref 38.5–50.0)
HEMOGLOBIN: 15.2 g/dL (ref 13.2–17.1)
Lymphs Abs: 1782 cells/uL (ref 850–3900)
MCH: 32.3 pg (ref 27.0–33.0)
MCHC: 34.9 g/dL (ref 32.0–36.0)
MCV: 92.4 fL (ref 80.0–100.0)
MONOS PCT: 8.7 %
MPV: 10.5 fL (ref 7.5–12.5)
NEUTROS ABS: 3152 {cells}/uL (ref 1500–7800)
Neutrophils Relative %: 57.3 %
PLATELETS: 253 10*3/uL (ref 140–400)
RBC: 4.71 10*6/uL (ref 4.20–5.80)
RDW: 11.9 % (ref 11.0–15.0)
TOTAL LYMPHOCYTE: 32.4 %
WBC mixed population: 479 cells/uL (ref 200–950)
WBC: 5.5 10*3/uL (ref 3.8–10.8)

## 2017-10-21 LAB — BASIC METABOLIC PANEL WITH GFR
BUN: 16 mg/dL (ref 7–25)
CALCIUM: 10 mg/dL (ref 8.6–10.3)
CHLORIDE: 101 mmol/L (ref 98–110)
CO2: 28 mmol/L (ref 20–32)
CREATININE: 1.04 mg/dL (ref 0.70–1.25)
GFR, Est African American: 86 mL/min/{1.73_m2} (ref >=60)
GFR, Est Non African American: 74 mL/min/{1.73_m2} (ref >=60)
GLUCOSE: 84 mg/dL (ref 65–99)
Potassium: 4.4 mmol/L (ref 3.5–5.3)
Sodium: 137 mmol/L (ref 135–146)

## 2017-10-21 LAB — URINALYSIS, ROUTINE W REFLEX MICROSCOPIC
BILIRUBIN URINE: NEGATIVE
GLUCOSE, UA: NEGATIVE
Hgb urine dipstick: NEGATIVE
KETONES UR: NEGATIVE
Leukocytes, UA: NEGATIVE
Nitrite: NEGATIVE
PH: 5.5 (ref 5.0–8.0)
Protein, ur: NEGATIVE
SPECIFIC GRAVITY, URINE: 1.018 (ref 1.001–1.03)

## 2017-10-21 LAB — LIPID PANEL
CHOLESTEROL: 220 mg/dL — AB (ref <200)
HDL: 45 mg/dL (ref >40)
LDL CHOLESTEROL (CALC): 146 mg/dL — AB
Non-HDL Cholesterol (Calc): 175 mg/dL (calc) — ABNORMAL HIGH (ref <130)
TRIGLYCERIDES: 158 mg/dL — AB (ref <150)
Total CHOL/HDL Ratio: 4.9 (calc) (ref <5.0)

## 2017-10-21 LAB — HEPATIC FUNCTION PANEL
AG RATIO: 1.8 (calc) (ref 1.0–2.5)
ALT: 25 U/L (ref 9–46)
AST: 21 U/L (ref 10–35)
Albumin: 4.8 g/dL (ref 3.6–5.1)
Alkaline phosphatase (APISO): 59 U/L (ref 40–115)
BILIRUBIN INDIRECT: 0.4 mg/dL (ref 0.2–1.2)
Bilirubin, Direct: 0.1 mg/dL (ref 0.0–0.2)
GLOBULIN: 2.6 g/dL (ref 1.9–3.7)
TOTAL PROTEIN: 7.4 g/dL (ref 6.1–8.1)
Total Bilirubin: 0.5 mg/dL (ref 0.2–1.2)

## 2017-10-21 LAB — INSULIN, RANDOM: Insulin: 5.5 u[IU]/mL (ref 2.0–19.6)

## 2017-10-21 LAB — HEMOGLOBIN A1C
EAG (MMOL/L): 5.5 (calc)
Hgb A1c MFr Bld: 5.1 % of total Hgb (ref <5.7)
MEAN PLASMA GLUCOSE: 100 (calc)

## 2017-10-21 LAB — TSH: TSH: 2.17 m[IU]/L (ref 0.40–4.50)

## 2017-10-21 LAB — VITAMIN D 25 HYDROXY (VIT D DEFICIENCY, FRACTURES): Vit D, 25-Hydroxy: 40 ng/mL (ref 30–100)

## 2017-10-21 LAB — MAGNESIUM: MAGNESIUM: 2.1 mg/dL (ref 1.5–2.5)

## 2017-10-24 ENCOUNTER — Ambulatory Visit (INDEPENDENT_AMBULATORY_CARE_PROVIDER_SITE_OTHER): Payer: Medicare Other | Admitting: Podiatry

## 2017-10-24 DIAGNOSIS — B351 Tinea unguium: Secondary | ICD-10-CM

## 2017-11-07 NOTE — Progress Notes (Signed)
Pt presents with mycotic infection of nails 1-5 bilateral  All other systems are negative  Laser therapy administered to affected nails and tolerated well. All safety precautions were in place. Re-appointed in 4 weeks for 4th treatment 

## 2017-11-21 ENCOUNTER — Ambulatory Visit (INDEPENDENT_AMBULATORY_CARE_PROVIDER_SITE_OTHER): Payer: Medicare Other | Admitting: Podiatry

## 2017-11-21 DIAGNOSIS — B351 Tinea unguium: Secondary | ICD-10-CM

## 2017-12-05 NOTE — Progress Notes (Signed)
Pt presents with mycotic infection of nails 1-5 bilateral   All other systems are negative  Laser therapy administered to affected nails and tolerated well. All safety precautions were in place. Re-appointed in 4 weeks for 5th treatment 

## 2018-01-09 ENCOUNTER — Ambulatory Visit: Payer: Medicare Other | Admitting: Podiatry

## 2018-01-16 ENCOUNTER — Ambulatory Visit: Payer: Medicare Other | Admitting: Podiatry

## 2018-01-23 ENCOUNTER — Ambulatory Visit: Payer: Medicare Other | Admitting: Podiatry

## 2018-01-23 DIAGNOSIS — B351 Tinea unguium: Secondary | ICD-10-CM

## 2018-01-25 NOTE — Progress Notes (Signed)
   HPI: 68 year old male presenting today for follow up evaluation of bilateral onychomycosis. He states the symptoms have significantly improved. He has been applying antifungal nail lacquer and taking Lamisil 250 mg as directed with one month completed. He denies any new complaints at this time. Patient is here for further evaluation and treatment.   Past Medical History:  Diagnosis Date  . BPH (benign prostatic hyperplasia)      Physical Exam: General: The patient is alert and oriented x3 in no acute distress.  Dermatology: Skin is warm, dry and supple bilateral lower extremities. Negative for open lesions or macerations.  Vascular: Palpable pedal pulses bilaterally. No edema or erythema noted. Capillary refill within normal limits.  Neurological: Epicritic and protective threshold grossly intact bilaterally.   Musculoskeletal Exam: Range of motion within normal limits to all pedal and ankle joints bilateral. Muscle strength 5/5 in all groups bilateral.   Assessment: - Onychomycosis bilateral - resolved   Plan of Care:  - Patient evaluated.   - Recommended good shoe gear.  - Recommended OTC foot powder for preventative maintenance.  - Return to clinic as needed.     Edrick Kins, DPM [facility]  Dr. Edrick Kins, DPM    [address], [place] [address]                Office [phone]  Fax [phone]

## 2018-05-03 ENCOUNTER — Other Ambulatory Visit: Payer: Self-pay | Admitting: Internal Medicine

## 2018-07-20 ENCOUNTER — Other Ambulatory Visit: Payer: Self-pay | Admitting: Internal Medicine

## 2018-07-20 ENCOUNTER — Other Ambulatory Visit: Payer: Self-pay | Admitting: Adult Health

## 2018-07-26 ENCOUNTER — Other Ambulatory Visit: Payer: Self-pay | Admitting: Internal Medicine

## 2018-07-26 ENCOUNTER — Other Ambulatory Visit: Payer: Self-pay | Admitting: Adult Health

## 2018-10-30 ENCOUNTER — Encounter: Payer: Self-pay | Admitting: Internal Medicine

## 2018-10-30 DIAGNOSIS — Z8249 Family history of ischemic heart disease and other diseases of the circulatory system: Secondary | ICD-10-CM | POA: Insufficient documentation

## 2018-10-30 DIAGNOSIS — R0989 Other specified symptoms and signs involving the circulatory and respiratory systems: Secondary | ICD-10-CM | POA: Insufficient documentation

## 2018-10-30 NOTE — Progress Notes (Signed)
[facility]   Unk Pinto, M.D.     Troy [name]. Silverio Lay, P.A.-C Liane Comber, [place]                [address] [place], [place] [address] Telephone [phone] Telefax [phone] Annual  Screening/Preventative Visit  & Comprehensive Evaluation & Examination     This very nice 67 y.o. MWM presents for a Screening /Preventative Visit & comprehensive evaluation and management of multiple medical co-morbidities.  Patient has been followed for HTN, HLD, Prediabetes and Vitamin D Deficiency.     Patient is followed by Dr Diona Fanti for hx/o elevated PSA w/negative Bx's x 2 in 2012 & 2014 & also he has hx/o a negative Prostate MRI.      Patient is followed expectantly for labile HTN predates since     . Patient's BP has been controlled at home.  Today's BP is at goal - 126/76. Patient denies any cardiac symptoms as chest pain, palpitations, shortness of breath, dizziness or ankle swelling.     Patient's hyperlipidemia is not controlled despite his Vegan diet.  Lab Results  Component Value Date   CHOL 220 (H) 10/20/2017   HDL 45 10/20/2017   LDLCALC 146 (H) 10/20/2017   LDLDIRECT 151.3 07/15/2009   TRIG 158 (H) 10/20/2017   CHOLHDL 4.9 10/20/2017      Patient is followed expectantly for glucose intolerance and patient denies reactive hypoglycemic symptoms, visual blurring, diabetic polys or paresthesias. Last A1c was Normal & at goal: Lab Results  Component Value Date   HGBA1C 5.1 10/20/2017       Finally, patient has history of Vitamin D Deficiency ("42" / 2016)  and last vitamin D was still not at goal (70-100)  Lab Results  Component Value Date   VD25OH 40 10/20/2017   Current Outpatient Medications on File Prior to Visit  Medication Sig  . finasteride (PROSCAR) 5 MG tablet TAKE ONE TABLET BY MOUTH DAILY FOR PROSTATE  . meloxicam (MOBIC) 15 MG tablet TAKE ONE TABLET BY MOUTH DAILY   . Multiple Vitamin (MULTIVITAMIN) capsule Take by mouth.  . sildenafil (REVATIO) 20 MG tablet TAKE 2-5 TABLETS DAILY AS NEEDED   No current facility-administered medications on file prior to visit.    No Known Allergies   Past Medical History:  Diagnosis Date  . BPH (benign prostatic hyperplasia)    Health Maintenance  Topic Date Due  . PNA vac Low Risk Adult (2 of 2 - PPSV23) 10/04/2017  . INFLUENZA VACCINE  06/22/2018  . TETANUS/TDAP  07/16/2019  . COLONOSCOPY  12/28/2020  . Hepatitis C Screening  Completed   Immunization History  Administered Date(s) Administered  . Influenza Split 10/30/2012, 08/20/2014, 08/25/2015  . Influenza Whole 10/22/2005, 07/19/2010, 08/04/2011  . Influenza, High Dose Seasonal PF 09/20/2016, 10/20/2017  . Influenza-Unspecified 09/06/2018  . PPD Test 08/20/2014, 08/25/2015  . Pneumococcal Conjugate-13 10/04/2016  . Td 11/22/1998, 07/15/2009  . Zoster 08/14/2013   Last Colon - 12/29/2015 - Dr Fuller Plan - adenoma polyps - recc f/u 5 yrs - due Feb 2022  Past Surgical History:  Procedure Laterality Date  . COLONOSCOPY  11-24-2005   STARK-TICS ONLY  . EYE SURGERY Bilateral 2005   lasik  . JOINT REPLACEMENT Left 2007   Lt THR - Dr Ronnie Derby   Family History  Problem Relation Age of Onset  . Heart  disease Father   . Cancer Brother        Lung Cancer  . Colon cancer Neg Hx   . Colon polyps Neg Hx   . Esophageal cancer Neg Hx   . Rectal cancer Neg Hx   . Stomach cancer Neg Hx    Social History   Socioeconomic History  . Marital status: Married    Spouse name: Tavella  . Number of children: 1 daughter   Occupational History  .   Tobacco Use  . Smoking status: Never Smoker  . Smokeless tobacco: Never Used  Substance and Sexual Activity  . Alcohol use: No    Alcohol/week: 0.0 standard drinks  . Drug use: No  . Sexual activity: Not on file    ROS Constitutional: Denies fever, chills, weight loss/gain, headaches, insomnia,  night sweats or  change in appetite. Does c/o fatigue. Eyes: Denies redness, blurred vision, diplopia, discharge, itchy or watery eyes.  ENT: Denies discharge, congestion, post nasal drip, epistaxis, sore throat, earache, hearing loss, dental pain, Tinnitus, Vertigo, Sinus pain or snoring.  Cardio: Denies chest pain, palpitations, irregular heartbeat, syncope, dyspnea, diaphoresis, orthopnea, PND, claudication or edema Respiratory: denies cough, dyspnea, DOE, pleurisy, hoarseness, laryngitis or wheezing.  Gastrointestinal: Denies dysphagia, heartburn, reflux, water brash, pain, cramps, nausea, vomiting, bloating, diarrhea, constipation, hematemesis, melena, hematochezia, jaundice or hemorrhoids Genitourinary: Denies dysuria, frequency, discharge, hematuria or flank pain. Has urgency, nocturia x 2-3 & occasional hesitancy. Musculoskeletal: Denies arthralgia, myalgia, stiffness, Jt. Swelling, pain, limp or strain/sprain. Denies Falls. Skin: Denies puritis, rash, hives, warts, acne, eczema or change in skin lesion Neuro: No weakness, tremor, incoordination, spasms, paresthesia or pain Psychiatric: Denies confusion, memory loss or sensory loss. Denies Depression. Endocrine: Denies change in weight, skin, hair change, nocturia, and paresthesia, diabetic polys, visual blurring or hyper / hypo glycemic episodes.  Heme/Lymph: No excessive bleeding, bruising or enlarged lymph nodes.  Physical Exam  BP 126/76   Pulse 60   Temp (!) 97 F (36.1 C)   Resp 16   Ht 5\' 10"  (1.778 m)   Wt 211 lb 12.8 oz (96.1 kg)   BMI 30.39 kg/m   General Appearance: Well nourished and well groomed and in no apparent distress.  Eyes: PERRLA, EOMs, conjunctiva no swelling or erythema, normal fundi and vessels. Sinuses: No frontal/maxillary tenderness ENT/Mouth: EACs patent / TMs  nl. Nares clear without erythema, swelling, mucoid exudates. Oral hygiene is good. No erythema, swelling, or exudate. Tongue normal, non-obstructing. Tonsils  not swollen or erythematous. Hearing normal.  Neck: Supple, thyroid not palpable. No bruits, nodes or JVD. Respiratory: Respiratory effort normal.  BS equal and clear bilateral without rales, rhonci, wheezing or stridor. Cardio: Heart sounds are normal with regular rate and rhythm and no murmurs, rubs or gallops. Peripheral pulses are normal and equal bilaterally without edema. No aortic or femoral bruits. Chest: symmetric with normal excursions and percussion.  Abdomen: Soft, with Nl bowel sounds. Nontender, no guarding, rebound, hernias, masses, or organomegaly.  Lymphatics: Non tender without lymphadenopathy.  Genitourinary: Deferred to Dr Diona Fanti Musculoskeletal: Full ROM all peripheral extremities, joint stability, 5/5 strength, and normal gait. Skin: Warm and dry without rashes, lesions, cyanosis, clubbing or  ecchymosis.  Neuro: Cranial nerves intact, reflexes equal bilaterally. Normal muscle tone, no cerebellar symptoms. Sensation intact.  Pysch: Alert and oriented X 3 with normal affect, insight and judgment appropriate.   Assessment and Plan  1. Annual Preventative/Screening Exam   2. Labile hypertension  - EKG 12-Lead - [place], RETROPERITNL  ABD,  LTD - Urinalysis, Routine w reflex microscopic - Microalbumin / creatinine urine ratio - CBC with Differential/Platelet - COMPLETE METABOLIC PANEL WITH GFR - Magnesium - TSH  3. Hyperlipidemia, mixed  - EKG 12-Lead - [place], RETROPERITNL ABD,  LTD - Lipid panel - TSH  4. Abnormal glucose  - EKG 12-Lead - [place], RETROPERITNL ABD,  LTD - Hemoglobin A1c - Insulin, random  5. Vitamin D deficiency  - VITAMIN D 25 Hydroxyl  6. Screening for colorectal cancer  - POC Hemoccult Bld/Stl  7. BPH with obstruction/lower urinary tract symptoms   8. Elevated PSA   9. Prostate cancer screening   10. Screening for ischemic heart disease  - EKG 12-Lead  11. FHx: heart disease  - EKG 12-Lead - [place], RETROPERITNL ABD,   LTD  12. Screening for AAA (aortic abdominal aneurysm)  - [place], RETROPERITNL ABD,  LTD  13. Medication management  - Urinalysis, Routine w reflex microscopic - Microalbumin / creatinine urine ratio - CBC with Differential/Platelet - COMPLETE METABOLIC PANEL WITH GFR - Magnesium - Lipid panel - TSH - Hemoglobin A1c - Insulin, random - VITAMIN D 25 Hydroxyl  14. Need for prophylactic vaccination and inoculation against influenza  - Pneumococcal polysaccharide vaccine 23-valent greater than or equal to 2yo subcutaneous/IM  15. Need for prophylactic vaccination against Streptococcus pneumoniae (pneumococcus)           Patient was counseled in prudent diet, weight control to achieve/maintain BMI less than 25, BP monitoring, regular exercise and medications as discussed.  Discussed med effects and SE's. Routine screening labs and tests as requested with regular follow-up as recommended. Over 40 minutes of exam, counseling, chart review and high complex critical decision making was performed

## 2018-10-30 NOTE — Patient Instructions (Signed)

## 2018-10-31 ENCOUNTER — Ambulatory Visit: Payer: Medicare Other | Admitting: Internal Medicine

## 2018-10-31 VITALS — BP 126/76 | HR 60 | Temp 97.0°F | Resp 16 | Ht 70.0 in | Wt 211.8 lb

## 2018-10-31 DIAGNOSIS — Z0001 Encounter for general adult medical examination with abnormal findings: Secondary | ICD-10-CM

## 2018-10-31 DIAGNOSIS — Z125 Encounter for screening for malignant neoplasm of prostate: Secondary | ICD-10-CM

## 2018-10-31 DIAGNOSIS — I10 Essential (primary) hypertension: Secondary | ICD-10-CM | POA: Diagnosis not present

## 2018-10-31 DIAGNOSIS — N401 Enlarged prostate with lower urinary tract symptoms: Secondary | ICD-10-CM

## 2018-10-31 DIAGNOSIS — Z1212 Encounter for screening for malignant neoplasm of rectum: Secondary | ICD-10-CM

## 2018-10-31 DIAGNOSIS — R0989 Other specified symptoms and signs involving the circulatory and respiratory systems: Secondary | ICD-10-CM

## 2018-10-31 DIAGNOSIS — Z136 Encounter for screening for cardiovascular disorders: Secondary | ICD-10-CM

## 2018-10-31 DIAGNOSIS — N138 Other obstructive and reflux uropathy: Secondary | ICD-10-CM

## 2018-10-31 DIAGNOSIS — Z23 Encounter for immunization: Secondary | ICD-10-CM | POA: Diagnosis not present

## 2018-10-31 DIAGNOSIS — Z0000 Encounter for general adult medical examination without abnormal findings: Secondary | ICD-10-CM | POA: Diagnosis not present

## 2018-10-31 DIAGNOSIS — E559 Vitamin D deficiency, unspecified: Secondary | ICD-10-CM

## 2018-10-31 DIAGNOSIS — Z1211 Encounter for screening for malignant neoplasm of colon: Secondary | ICD-10-CM

## 2018-10-31 DIAGNOSIS — R972 Elevated prostate specific antigen [PSA]: Secondary | ICD-10-CM

## 2018-10-31 DIAGNOSIS — R7309 Other abnormal glucose: Secondary | ICD-10-CM

## 2018-10-31 DIAGNOSIS — Z79899 Other long term (current) drug therapy: Secondary | ICD-10-CM

## 2018-10-31 DIAGNOSIS — E782 Mixed hyperlipidemia: Secondary | ICD-10-CM

## 2018-10-31 DIAGNOSIS — Z8249 Family history of ischemic heart disease and other diseases of the circulatory system: Secondary | ICD-10-CM

## 2018-11-01 ENCOUNTER — Other Ambulatory Visit: Payer: Self-pay | Admitting: Internal Medicine

## 2018-11-01 LAB — MICROALBUMIN / CREATININE URINE RATIO
Creatinine, Urine: 132 mg/dL (ref 20–320)
Microalb Creat Ratio: 2 mcg/mg creat (ref <30)
Microalb, Ur: 0.2 mg/dL

## 2018-11-01 LAB — CBC WITH DIFFERENTIAL/PLATELET
BASOS PCT: 0.7 %
Basophils Absolute: 41 cells/uL (ref 0–200)
Eosinophils Absolute: 59 cells/uL (ref 15–500)
Eosinophils Relative: 1 %
HCT: 42.9 % (ref 38.5–50.0)
Hemoglobin: 15 g/dL (ref 13.2–17.1)
Lymphs Abs: 2041 cells/uL (ref 850–3900)
MCH: 31.8 pg (ref 27.0–33.0)
MCHC: 35 g/dL (ref 32.0–36.0)
MCV: 90.9 fL (ref 80.0–100.0)
MPV: 10.4 fL (ref 7.5–12.5)
Monocytes Relative: 9.9 %
Neutro Abs: 3174 cells/uL (ref 1500–7800)
Neutrophils Relative %: 53.8 %
Platelets: 287 10*3/uL (ref 140–400)
RBC: 4.72 10*6/uL (ref 4.20–5.80)
RDW: 12 % (ref 11.0–15.0)
Total Lymphocyte: 34.6 %
WBC mixed population: 584 cells/uL (ref 200–950)
WBC: 5.9 10*3/uL (ref 3.8–10.8)

## 2018-11-01 LAB — LIPID PANEL
CHOL/HDL RATIO: 5.8 (calc) — AB (ref <5.0)
Cholesterol: 202 mg/dL — ABNORMAL HIGH (ref <200)
HDL: 35 mg/dL — ABNORMAL LOW (ref >40)
LDL Cholesterol (Calc): 127 mg/dL (calc) — ABNORMAL HIGH
Non-HDL Cholesterol (Calc): 167 mg/dL (calc) — ABNORMAL HIGH (ref <130)
Triglycerides: 245 mg/dL — ABNORMAL HIGH (ref <150)

## 2018-11-01 LAB — COMPLETE METABOLIC PANEL WITH GFR
AG RATIO: 1.8 (calc) (ref 1.0–2.5)
ALT: 30 U/L (ref 9–46)
AST: 22 U/L (ref 10–35)
Albumin: 4.8 g/dL (ref 3.6–5.1)
Alkaline phosphatase (APISO): 58 U/L (ref 40–115)
BUN: 13 mg/dL (ref 7–25)
CO2: 26 mmol/L (ref 20–32)
Calcium: 9.9 mg/dL (ref 8.6–10.3)
Chloride: 102 mmol/L (ref 98–110)
Creat: 0.99 mg/dL (ref 0.70–1.25)
GFR, EST AFRICAN AMERICAN: 91 mL/min/{1.73_m2} (ref >=60)
GFR, Est Non African American: 78 mL/min/{1.73_m2} (ref >=60)
Globulin: 2.6 g/dL (calc) (ref 1.9–3.7)
Glucose, Bld: 88 mg/dL (ref 65–99)
Potassium: 4.9 mmol/L (ref 3.5–5.3)
Sodium: 136 mmol/L (ref 135–146)
Total Bilirubin: 0.5 mg/dL (ref 0.2–1.2)
Total Protein: 7.4 g/dL (ref 6.1–8.1)

## 2018-11-01 LAB — URINALYSIS, ROUTINE W REFLEX MICROSCOPIC
Bilirubin Urine: NEGATIVE
Glucose, UA: NEGATIVE
Hgb urine dipstick: NEGATIVE
Ketones, ur: NEGATIVE
Leukocytes, UA: NEGATIVE
Nitrite: NEGATIVE
PH: 6 (ref 5.0–8.0)
Protein, ur: NEGATIVE
Specific Gravity, Urine: 1.018 (ref 1.001–1.03)

## 2018-11-01 LAB — HEMOGLOBIN A1C
Hgb A1c MFr Bld: 5.2 % of total Hgb (ref <5.7)
Mean Plasma Glucose: 103 (calc)
eAG (mmol/L): 5.7 (calc)

## 2018-11-01 LAB — TSH: TSH: 2.04 mIU/L (ref 0.40–4.50)

## 2018-11-01 LAB — INSULIN, RANDOM: Insulin: 11.3 u[IU]/mL (ref 2.0–19.6)

## 2018-11-01 LAB — VITAMIN D 25 HYDROXY (VIT D DEFICIENCY, FRACTURES): Vit D, 25-Hydroxy: 35 ng/mL (ref 30–100)

## 2018-11-01 LAB — MAGNESIUM: Magnesium: 2.2 mg/dL (ref 1.5–2.5)

## 2018-11-01 MED ORDER — ALPRAZOLAM 1 MG PO TABS: ORAL_TABLET | ORAL | 0 refills | Status: DC

## 2018-11-05 ENCOUNTER — Other Ambulatory Visit: Payer: Self-pay | Admitting: Internal Medicine

## 2018-11-05 ENCOUNTER — Encounter: Payer: Self-pay | Admitting: Internal Medicine

## 2018-11-06 ENCOUNTER — Telehealth: Payer: Self-pay | Admitting: *Deleted

## 2018-11-06 ENCOUNTER — Other Ambulatory Visit: Payer: Self-pay | Admitting: *Deleted

## 2018-11-06 NOTE — Telephone Encounter (Signed)
A message was left to cancel the RX for Xanax at the patient's pharmacy, per Dr Melford Aase.  The patient states he did not request the rx

## 2018-12-05 ENCOUNTER — Other Ambulatory Visit: Payer: Self-pay | Admitting: Internal Medicine

## 2018-12-05 DIAGNOSIS — E782 Mixed hyperlipidemia: Secondary | ICD-10-CM

## 2018-12-05 MED ORDER — ROSUVASTATIN CALCIUM 20 MG PO TABS: ORAL_TABLET | ORAL | 1 refills | Status: DC

## 2018-12-11 ENCOUNTER — Ambulatory Visit (INDEPENDENT_AMBULATORY_CARE_PROVIDER_SITE_OTHER)
Admission: RE | Admit: 2018-12-11 | Discharge: 2018-12-11 | Disposition: A | Discharge status: Home / Self Care | Payer: Medicare Other | Source: Ambulatory Visit | Attending: Family Medicine | Admitting: Family Medicine

## 2018-12-11 ENCOUNTER — Encounter: Payer: Self-pay | Admitting: Family Medicine

## 2018-12-11 ENCOUNTER — Other Ambulatory Visit (INDEPENDENT_AMBULATORY_CARE_PROVIDER_SITE_OTHER): Payer: Medicare Other

## 2018-12-11 ENCOUNTER — Ambulatory Visit: Payer: Medicare Other | Admitting: Family Medicine

## 2018-12-11 VITALS — BP 140/80 | HR 70 | Ht 70.0 in | Wt 214.0 lb

## 2018-12-11 DIAGNOSIS — M545 Low back pain, unspecified: Secondary | ICD-10-CM

## 2018-12-11 DIAGNOSIS — M9902 Segmental and somatic dysfunction of thoracic region: Secondary | ICD-10-CM | POA: Diagnosis not present

## 2018-12-11 DIAGNOSIS — M9904 Segmental and somatic dysfunction of sacral region: Secondary | ICD-10-CM | POA: Diagnosis not present

## 2018-12-11 DIAGNOSIS — M9903 Segmental and somatic dysfunction of lumbar region: Secondary | ICD-10-CM | POA: Diagnosis not present

## 2018-12-11 DIAGNOSIS — M2550 Pain in unspecified joint: Secondary | ICD-10-CM | POA: Diagnosis not present

## 2018-12-11 DIAGNOSIS — M999 Biomechanical lesion, unspecified: Secondary | ICD-10-CM | POA: Insufficient documentation

## 2018-12-11 DIAGNOSIS — M5136 Other intervertebral disc degeneration, lumbar region: Secondary | ICD-10-CM

## 2018-12-11 LAB — CBC WITH DIFFERENTIAL/PLATELET
Basophils Absolute: 0 10*3/uL (ref 0.0–0.1)
Basophils Relative: 0.6 % (ref 0.0–3.0)
Eosinophils Absolute: 0.1 10*3/uL (ref 0.0–0.7)
Eosinophils Relative: 0.8 % (ref 0.0–5.0)
HEMATOCRIT: 42.4 % (ref 39.0–52.0)
Hemoglobin: 14.7 g/dL (ref 13.0–17.0)
LYMPHS PCT: 29.7 % (ref 12.0–46.0)
Lymphs Abs: 2.3 10*3/uL (ref 0.7–4.0)
MCHC: 34.7 g/dL (ref 30.0–36.0)
MCV: 92.6 fl (ref 78.0–100.0)
Monocytes Absolute: 0.5 10*3/uL (ref 0.1–1.0)
Monocytes Relative: 6.3 % (ref 3.0–12.0)
Neutro Abs: 4.9 10*3/uL (ref 1.4–7.7)
Neutrophils Relative %: 62.6 % (ref 43.0–77.0)
Platelets: 240 10*3/uL (ref 150.0–400.0)
RBC: 4.58 Mil/uL (ref 4.22–5.81)
RDW: 12.8 % (ref 11.5–15.5)
WBC: 7.8 10*3/uL (ref 4.0–10.5)

## 2018-12-11 LAB — COMPREHENSIVE METABOLIC PANEL
ALT: 32 U/L (ref 0–53)
AST: 23 U/L (ref 0–37)
Albumin: 4.5 g/dL (ref 3.5–5.2)
Alkaline Phosphatase: 53 U/L (ref 39–117)
BUN: 17 mg/dL (ref 6–23)
CO2: 24 mEq/L (ref 19–32)
Calcium: 9.7 mg/dL (ref 8.4–10.5)
Chloride: 102 mEq/L (ref 96–112)
Creatinine, Ser: 1.11 mg/dL (ref 0.40–1.50)
GFR: 65.88 mL/min (ref >60.00)
Glucose, Bld: 100 mg/dL — ABNORMAL HIGH (ref 70–99)
Potassium: 4 mEq/L (ref 3.5–5.1)
Sodium: 134 mEq/L — ABNORMAL LOW (ref 135–145)
Total Bilirubin: 0.3 mg/dL (ref 0.2–1.2)
Total Protein: 7 g/dL (ref 6.0–8.3)

## 2018-12-11 LAB — SEDIMENTATION RATE: Sed Rate: 5 mm/hr (ref 0–20)

## 2018-12-11 LAB — IBC PANEL
Iron: 124 ug/dL (ref 42–165)
Saturation Ratios: 30.2 % (ref 20.0–50.0)
Transferrin: 293 mg/dL (ref 212.0–360.0)

## 2018-12-11 LAB — VITAMIN D 25 HYDROXY (VIT D DEFICIENCY, FRACTURES): VITD: 32.99 ng/mL (ref 30.00–100.00)

## 2018-12-11 LAB — URIC ACID: Uric Acid, Serum: 9.6 mg/dL — ABNORMAL HIGH (ref 4.0–7.8)

## 2018-12-11 LAB — FERRITIN: Ferritin: 32.9 ng/mL (ref 22.0–322.0)

## 2018-12-11 LAB — TESTOSTERONE: Testosterone: 365.06 ng/dL (ref 300.00–890.00)

## 2018-12-11 LAB — C-REACTIVE PROTEIN: CRP: 0.1 mg/dL — ABNORMAL LOW (ref 0.5–20.0)

## 2018-12-11 LAB — TSH: TSH: 1.82 u[IU]/mL (ref 0.35–4.50)

## 2018-12-11 NOTE — Assessment & Plan Note (Signed)
Decision today to treat with OMT was based on Physical Exam  After verbal consent patient was treated with HVLA, ME, FPR techniques in  thoracic, lumbar and sacral areas  Patient tolerated the procedure well with improvement in symptoms  Patient given exercises, stretches and lifestyle modifications  See medications in patient instructions if given  Patient will follow up in 4-6 weeks 

## 2018-12-11 NOTE — Progress Notes (Signed)
[name] [address], [place] [address] Phone: [phone] Subjective:   Troy Troy [name], am serving as a scribe for Dr. Hulan [name].  I'm seeing this patient by the request  of:  Troy Pinto, MD   CC: Joint pains  QJJ:[id number]  Troy Troy [name] is a 68 y.o. male coming in with complaint of joint pain. Has been diagnosed with osteo arthritis. 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 Is active. Feels like he is kyphotic and also is shuffling his feet a lot recently. Intermittent joint achiness. Does notice changes in weather cause increase in pain. Would like to discuss ideas for flexibility as he is unable to touch his toes. Uses meloxicam for his pain.  Does use turmeric and flax seed daily.        Past Medical History:  Diagnosis Date  . BPH (benign prostatic hyperplasia)    Past Surgical History:  Procedure Laterality Date  . COLONOSCOPY  11-24-2005   STARK-TICS ONLY  . EYE SURGERY Bilateral 2005   lasik  . JOINT REPLACEMENT Left 2007   Lt THR - Dr Troy Troy [name]   Social History   Socioeconomic History  . Marital status: Married    Spouse name: Not on file  . Number of children: Not on file  . Years of education: Not on file  . Highest education level: Not on file  Occupational History  . Not on file  Social Needs  . Financial resource strain: Not on file  . Food insecurity:    Worry: Not on file    Inability: Not on file  . Transportation needs:    Medical: Not on file    Non-medical: Not on file  Tobacco Use  . Smoking status: Never Smoker  . Smokeless tobacco: Never Used  Substance and Sexual Activity  . Alcohol use: Troy [name]    Alcohol/week: 0.0 standard drinks  . Drug use: Troy [name]  . Sexual activity: Not on file  Lifestyle  . Physical activity:    Days per week: Not on file    Minutes per session: Not on file  . Stress: Not on file  Relationships  . Social connections:    Talks on phone: Not on file    Gets together: Not on  file    Attends religious service: Not on file    Active member of club or organization: Not on file    Attends meetings of clubs or organizations: Not on file    Relationship status: Not on file  Other Topics Concern  . Not on file  Social History Narrative  . Not on file   Troy [name] Known Allergies Family History  Problem Relation Age of Onset  . Heart disease Father   . Cancer Brother        Lung Cancer  . Colon cancer Neg Hx   . Colon polyps Neg Hx   . Esophageal cancer Neg Hx   . Rectal cancer Neg Hx   . Stomach cancer Neg Hx      Current Outpatient Medications (Cardiovascular):  .  rosuvastatin (CRESTOR) 20 MG tablet, Take 1 tablet daily for Cholesterol .  sildenafil (REVATIO) 20 MG tablet, TAKE 2-5 TABLETS DAILY AS NEEDED   Current Outpatient Medications (Analgesics):  .  meloxicam (MOBIC) 15 MG tablet, TAKE ONE TABLET BY MOUTH DAILY   Current Outpatient Medications (Other):  .  finasteride (PROSCAR) 5 MG tablet, TAKE ONE TABLET BY MOUTH DAILY FOR PROSTATE .  Multiple  Vitamin (MULTIVITAMIN) capsule, Take by mouth.    Past medical history, social, surgical and family history all reviewed in electronic medical record.  Troy [name] pertanent information unless stated regarding to the chief complaint.   Review of Systems:  Troy [name] headache, visual changes, nausea, vomiting, diarrhea, constipation, dizziness, abdominal pain, skin rash, fevers, chills, night sweats, weight loss, swollen lymph nodes, , chest pain, shortness of breath, mood changes.  Positive muscle aches, body aches  Objective  Blood pressure 140/80, pulse 70, height 5\' 10"  (1.778 m), weight 214 lb (97.1 kg), SpO2 97 %.   General: Troy [name] apparent distress alert and oriented x3 mood and affect normal, dressed appropriately.  HEENT: Pupils equal, extraocular movements intact  Respiratory: Patient's speak in full sentences and does not appear short of breath  Cardiovascular: Troy [name] lower extremity edema, non tender, Troy [name] erythema    Skin: Warm dry intact with Troy [name] signs of infection or rash on extremities or on axial skeleton.  Abdomen: Soft nontender  Neuro: Cranial nerves II through XII are intact, neurovascularly intact in all extremities with 2+ DTRs and 2+ pulses.  Lymph: Troy [name] lymphadenopathy of posterior or anterior cervical chain or axillae bilaterally.  Gait antalgic mild shuffling gait MSK: Mild tender with mild limited range of motion and good stability and symmetric strength and tone of shoulders, elbows, wrist, hip, knee and ankles bilaterally.   Back exam has some loss of lordosis.  Some tightness of the paraspinal musculature lumbar spine right greater than left.  Negative straight leg test but tightness of the hamstrings.  Positive Faber bilaterally.  Patient has limited range of motion in all planes by 5 to 10 degrees with only 15 degrees of extension of the lumbar spine  Osteopathic findings  T5 extended rotated and side bent left L2 flexed rotated and side bent right Sacrum right on right    Impression and Recommendations:     This case required medical decision making of moderate complexity. The above documentation has been reviewed and is accurate and complete Troy Pulley, DO       Note: This dictation was prepared with Dragon dictation along with smaller phrase technology. Any transcriptional errors that result from this process are unintentional.

## 2018-12-11 NOTE — Patient Instructions (Signed)
Good to see you  Xray downstairs Labs as well  Exercises 3 times a week.  Ok to take the crestor but take it with 200mg  of CoQ10 200mg  daily  Continue your other vitamins Keep up with he other exercises you described See em again in4 weeks

## 2018-12-11 NOTE — Assessment & Plan Note (Signed)
Patient likely has degenerative disc disease.  X-rays pending.  Will rule out other things that are causing patient's polyarthralgia.  Patient does have some mild shuffling gait and need to monitor for the possibility of even early onset of Parkinson's.  Patient is trying to be active but continues to having more and more muscle tightness.  Patient's wife is concerned.  Patient attempted to have osteopathic manipulation done today with minimal results at the moment.  Home exercises given.  Discussed over-the-counter medications.  Follow-up again in 4 to 6 weeks

## 2018-12-14 LAB — CYCLIC CITRUL PEPTIDE ANTIBODY, IGG

## 2018-12-14 LAB — ANGIOTENSIN CONVERTING ENZYME: Angiotensin-Converting Enzyme: 43 U/L (ref 9–67)

## 2018-12-14 LAB — ANA: Anti Nuclear Antibody(ANA): POSITIVE — AB

## 2018-12-14 LAB — CALCIUM, IONIZED: Calcium, Ion: 5.21 mg/dL (ref 4.8–5.6)

## 2018-12-14 LAB — RHEUMATOID FACTOR: Rhuematoid fact SerPl-aCnc: <14 IU/mL (ref <14)

## 2018-12-14 LAB — ANTI-NUCLEAR AB-TITER (ANA TITER): ANA Titer 1: 1:40 {titer} — ABNORMAL HIGH

## 2018-12-14 LAB — PTH, INTACT AND CALCIUM
Calcium: 9.7 mg/dL (ref 8.6–10.3)
PTH: 26 pg/mL (ref 14–64)

## 2019-01-07 NOTE — Progress Notes (Signed)
[facility] [address], [place] [address] Phone: [phone] Subjective:    I Kandace Blitz am serving as a Education administrator for Dr. Hulan Saas.   CC: Polyarthralgia follow-up  [id number]   Eric Nees is a 68 y.o. male coming in with complaint of polyarthralgia.  Found to have significant gout after laboratory work-up. States that he is feeling a lot better. Has started tart cherry.   Patient laboratory work-up did not find count and is doing better but still not perfect.  Wanting to know what else can be done.   X-rays of the lumbar spine were taken on January 23 independently visualized by me.  Multilevel degenerative disc disease noted as well as lumbar facet arthropathy mostly from L3-S1 mild degenerative scoliosis  Past Medical History:  Diagnosis Date  . BPH (benign prostatic hyperplasia)    Past Surgical History:  Procedure Laterality Date  . COLONOSCOPY  11-24-2005   STARK-TICS ONLY  . EYE SURGERY Bilateral 2005   lasik  . JOINT REPLACEMENT Left 2007   Lt THR - Dr Ronnie Derby   Social History   Socioeconomic History  . Marital status: Married    Spouse name: Not on file  . Number of children: Not on file  . Years of education: Not on file  . Highest education level: Not on file  Occupational History  . Not on file  Social Needs  . Financial resource strain: Not on file  . Food insecurity:    Worry: Not on file    Inability: Not on file  . Transportation needs:    Medical: Not on file    Non-medical: Not on file  Tobacco Use  . Smoking status: Never Smoker  . Smokeless tobacco: Never Used  Substance and Sexual Activity  . Alcohol use: No    Alcohol/week: 0.0 standard drinks  . Drug use: No  . Sexual activity: Not on file  Lifestyle  . Physical activity:    Days per week: Not on file    Minutes per session: Not on file  . Stress: Not on file  Relationships  . Social connections:    Talks on phone: Not on file    Gets  together: Not on file    Attends religious service: Not on file    Active member of club or organization: Not on file    Attends meetings of clubs or organizations: Not on file    Relationship status: Not on file  Other Topics Concern  . Not on file  Social History Narrative  . Not on file   No Known Allergies Family History  Problem Relation Age of Onset  . Heart disease Father   . Cancer Brother        Lung Cancer  . Colon cancer Neg Hx   . Colon polyps Neg Hx   . Esophageal cancer Neg Hx   . Rectal cancer Neg Hx   . Stomach cancer Neg Hx      Current Outpatient Medications (Cardiovascular):  .  rosuvastatin (CRESTOR) 20 MG tablet, Take 1 tablet daily for Cholesterol .  sildenafil (REVATIO) 20 MG tablet, TAKE 2-5 TABLETS DAILY AS NEEDED   Current Outpatient Medications (Analgesics):  .  meloxicam (MOBIC) 15 MG tablet, TAKE ONE TABLET BY MOUTH DAILY .  allopurinol (ZYLOPRIM) 100 MG tablet, Take 1 tablet (100 mg total) by mouth daily.   Current Outpatient Medications (Other):  .  finasteride (PROSCAR) 5 MG tablet, TAKE ONE  TABLET BY MOUTH DAILY FOR PROSTATE .  Multiple Vitamin (MULTIVITAMIN) capsule, Take by mouth.    Past medical history, social, surgical and family history all reviewed in electronic medical record.  No pertanent information unless stated regarding to the chief complaint.   Review of Systems:  No headache, visual changes, nausea, vomiting, diarrhea, constipation, dizziness, abdominal pain, skin rash, fevers, chills, night sweats, weight loss, swollen lymph nodes, body aches, joint swelling, muscle aches, chest pain, shortness of breath, mood changes.  Muscle aches  Objective  Blood pressure (!) 146/82, pulse 64, height 5\' 10"  (1.778 m), weight 213 lb (96.6 kg), SpO2 98 %.    General: No apparent distress alert and oriented x3 mood and affect normal, dressed appropriately.  HEENT: Pupils equal, extraocular movements intact  Respiratory: Patient's  speak in full sentences and does not appear short of breath  Cardiovascular: No lower extremity edema, non tender, no erythema  Skin: Warm dry intact with no signs of infection or rash on extremities or on axial skeleton.  Abdomen: Soft nontender  Neuro: Cranial nerves II through XII are intact, neurovascularly intact in all extremities with 2+ DTRs and 2+ pulses.  Lymph: No lymphadenopathy of posterior or anterior cervical chain or axillae bilaterally.  Gait normal with good balance and coordination.  MSK:  tender with mild limited range of motion and good stability and symmetric strength and tone of shoulders, elbows, wrist, hip, knee and ankles bilaterally.   Patient lumbar exam does have loss of lordosis.  Tightness with Corky Sox test.  Negative straight leg test but tightness of the hamstrings.  4+ out of 5 strength in lower extremities bilaterally.  Osteopathic findings  T8 extended rotated and side bent left L4 flexed rotated and side bent left  Sacrum right on right     Impression and Recommendations:     This case required medical decision making of moderate complexity. The above documentation has been reviewed and is accurate and complete Lyndal Pulley, DO       Note: This dictation was prepared with Dragon dictation along with smaller phrase technology. Any transcriptional errors that result from this process are unintentional.

## 2019-01-08 ENCOUNTER — Encounter: Payer: Self-pay | Admitting: Family Medicine

## 2019-01-08 ENCOUNTER — Ambulatory Visit: Payer: Medicare Other | Admitting: Family Medicine

## 2019-01-08 VITALS — BP 146/82 | HR 64 | Ht 70.0 in | Wt 213.0 lb

## 2019-01-08 DIAGNOSIS — M9904 Segmental and somatic dysfunction of sacral region: Secondary | ICD-10-CM

## 2019-01-08 DIAGNOSIS — M9903 Segmental and somatic dysfunction of lumbar region: Secondary | ICD-10-CM

## 2019-01-08 DIAGNOSIS — M109 Gout, unspecified: Secondary | ICD-10-CM | POA: Insufficient documentation

## 2019-01-08 DIAGNOSIS — M9902 Segmental and somatic dysfunction of thoracic region: Secondary | ICD-10-CM | POA: Diagnosis not present

## 2019-01-08 DIAGNOSIS — M999 Biomechanical lesion, unspecified: Secondary | ICD-10-CM

## 2019-01-08 DIAGNOSIS — M5136 Other intervertebral disc degeneration, lumbar region: Secondary | ICD-10-CM

## 2019-01-08 DIAGNOSIS — M51369 Other intervertebral disc degeneration, lumbar region without mention of lumbar back pain or lower extremity pain: Secondary | ICD-10-CM

## 2019-01-08 MED ORDER — ALLOPURINOL 100 MG PO TABS: 100.0000 mg | ORAL_TABLET | Freq: Every day | ORAL | 6 refills | Status: DC

## 2019-01-08 NOTE — Assessment & Plan Note (Signed)
Decision today to treat with OMT was based on Physical Exam  After verbal consent patient was treated with HVLA, ME, FPR techniques in  thoracic, lumbar and sacral areas  Patient tolerated the procedure well with improvement in symptoms  Patient given exercises, stretches and lifestyle modifications  See medications in patient instructions if given  Patient will follow up in 4-8 weeks 

## 2019-01-08 NOTE — Patient Instructions (Addendum)
Good to see you  Ice is your friend Stay active Continue the tart cherry  Also do allopurinol 200mg  daily Watch for a rash and if you get it stop it I think you will do great  See me again in 4 weeks

## 2019-01-08 NOTE — Assessment & Plan Note (Signed)
Gout .  Discussed icing regimen and home exercise.  Discussed hydration and diet changes.  Allopurinol started

## 2019-01-08 NOTE — Assessment & Plan Note (Signed)
Degenerative disc disease.  Discussed posture and ergonomics.  Discussed core strengthening.  Continue treatment of gout and start allopurinol.  Responded well to manipulation.  Follow-up again in 4 to 6 weeks

## 2019-01-18 ENCOUNTER — Encounter: Payer: Self-pay | Admitting: Adult Health

## 2019-01-18 ENCOUNTER — Ambulatory Visit: Payer: Medicare Other | Admitting: Adult Health

## 2019-01-18 VITALS — BP 144/80 | HR 70 | Temp 97.5°F | Ht 70.0 in | Wt 213.0 lb

## 2019-01-18 DIAGNOSIS — R6889 Other general symptoms and signs: Secondary | ICD-10-CM

## 2019-01-18 DIAGNOSIS — B9789 Other viral agents as the cause of diseases classified elsewhere: Secondary | ICD-10-CM

## 2019-01-18 DIAGNOSIS — J069 Acute upper respiratory infection, unspecified: Secondary | ICD-10-CM

## 2019-01-18 LAB — POC INFLUENZA A&B (BINAX/QUICKVUE)
INFLUENZA A, POC: NEGATIVE
Influenza B, POC: NEGATIVE

## 2019-01-18 MED ORDER — PROMETHAZINE-DM 6.25-15 MG/5ML PO SYRP: 5.0000 mL | ORAL_SOLUTION | Freq: Four times a day (QID) | ORAL | 1 refills | Status: DC | PRN

## 2019-01-18 MED ORDER — PREDNISONE 20 MG PO TABS: ORAL_TABLET | ORAL | 0 refills | Status: DC

## 2019-01-18 NOTE — Patient Instructions (Signed)
Call back if getting worse, if cough becomes productive (thick secretions), getting chest achiness, shortness of breath, etc  Ok to take tylenol/aleve/ibuprofen if needed, but prednisone should help significantly    HOW TO TREAT VIRAL COUGH AND COLD SYMPTOMS:  -Symptoms usually last at least 1 week with the worst symptoms being around day 4.  - colds usually start with a sore throat and end with a cough, and the cough can take 2 weeks to get better.  -No antibiotics are needed for colds, flu, sore throats, cough, bronchitis UNLESS symptoms are longer than 7 days OR if you are getting better then get drastically worse.  -There are a lot of combination medications (Dayquil, Nyquil, Vicks 44, tyelnol cold and sinus, ETC). Please look at the ingredients on the back so that you are treating the correct symptoms and not doubling up on medications/ingredients.    Medicines you can use  Nasal congestion  Little Remedies saline spray (aerosol/mist)- can try this, it is in the kids section - pseudoephedrine (Sudafed)- behind the counter, do not use if you have high blood pressure, medicine that have -D in them.  - phenylephrine (Sudafed PE) -Dextormethorphan + chlorpheniramine (Coridcidin HBP)- okay if you have high blood pressure -Oxymetazoline (Afrin) nasal spray- LIMIT to 3 days -Saline nasal spray -Neti pot (used distilled or bottled water)  Ear pain/congestion  -pseudoephedrine (sudafed) - Nasonex/flonase nasal spray  Fever  -Acetaminophen (Tyelnol) -Ibuprofen (Advil, motrin, aleve)  Sore Throat  -Acetaminophen (Tyelnol) -Ibuprofen (Advil, motrin, aleve) -Drink a lot of water -Gargle with salt water - Rest your voice (don't talk) -Throat sprays -Cough drops  Body Aches  -Acetaminophen (Tyelnol) -Ibuprofen (Advil, motrin, aleve)  Headache  -Acetaminophen (Tyelnol) -Ibuprofen (Advil, motrin, aleve) - Exedrin, Exedrin Migraine  Allergy symptoms (cough, sneeze, runny nose,  itchy eyes) -Claritin or loratadine cheapest but likely the weakest  -Zyrtec or certizine at night because it can make you sleepy -The strongest is allegra or fexafinadine  Cheapest at [employer], [employer], [employer]  Cough  -Dextromethorphan (Delsym)- medicine that has DM in it -Guafenesin (Mucinex/Robitussin) - cough drops - drink lots of water  Chest Congestion  -Guafenesin (Mucinex/Robitussin)  Red Itchy Eyes  - Naphcon-A  Upset Stomach  - Bland diet (nothing spicy, greasy, fried, and high acid foods like tomatoes, oranges, berries) -OKAY- cereal, bread, soup, crackers, rice -Eat smaller more frequent meals -reduce caffeine, no alcohol -Loperamide (Imodium-AD) if diarrhea -Prevacid for heart burn  General health when sick  -Hydration -wash your hands frequently -keep surfaces clean -change pillow cases and sheets often -Get fresh air but do not exercise strenuously -Vitamin D, double up on it - Vitamin C -Zinc    Influenza, Adult Influenza, more commonly known as "the flu," is a viral infection that mainly affects the respiratory tract. The respiratory tract includes organs that help you breathe, such as the lungs, nose, and throat. The flu causes many symptoms similar to the common cold along with high fever and body aches. The flu spreads easily from person to person (is contagious). Getting a flu shot (influenza vaccination) every year is the best way to prevent the flu. What are the causes? This condition is caused by the influenza virus. You can get the virus by:  Breathing in droplets that are in the air from an infected person's cough or sneeze.  Touching something that has been exposed to the virus (has been contaminated) and then touching your mouth, nose, or eyes. What increases the risk? The following factors  may make you more likely to get the flu:  Not washing or sanitizing your hands often.  Having close contact with many people during cold and flu  season.  Touching your mouth, eyes, or nose without first washing or sanitizing your hands.  Not getting a yearly (annual) flu shot. You may have a higher risk for the flu, including serious problems such as a lung infection (pneumonia), if you:  Are older than 65.  Are pregnant.  Have a weakened disease-fighting system (immune system). You may have a weakened immune system if you: ? Have HIV or AIDS. ? Are undergoing chemotherapy. ? Are taking medicines that reduce (suppress) the activity of your immune system.  Have a long-term (chronic) illness, such as heart disease, kidney disease, diabetes, or lung disease.  Have a liver disorder.  Are severely overweight (morbidly obese).  Have anemia. This is a condition that affects your red blood cells.  Have asthma. What are the signs or symptoms? Symptoms of this condition usually begin suddenly and last 4-14 days. They may include:  Fever and chills.  Headaches, body aches, or muscle aches.  Sore throat.  Cough.  Runny or stuffy (congested) nose.  Chest discomfort.  Poor appetite.  Weakness or fatigue.  Dizziness.  Nausea or vomiting. How is this diagnosed? This condition may be diagnosed based on:  Your symptoms and medical history.  A physical exam.  Swabbing your nose or throat and testing the fluid for the influenza virus. How is this treated? If the flu is diagnosed early, you can be treated with medicine that can help reduce how severe the illness is and how long it lasts (antiviral medicine). This may be given by mouth (orally) or through an IV. Taking care of yourself at home can help relieve symptoms. Your health care provider may recommend:  Taking over-the-counter medicines.  Drinking plenty of fluids. In many cases, the flu goes away on its own. If you have severe symptoms or complications, you may be treated in a hospital. Follow these instructions at home: Activity  Rest as needed and get  plenty of sleep.  Stay home from work or school as told by your health care provider. Unless you are visiting your health care provider, avoid leaving home until your fever has been gone for 24 hours without taking medicine. Eating and drinking  Take an oral rehydration solution (ORS). This is a drink that is sold at pharmacies and retail stores.  Drink enough fluid to keep your urine pale yellow.  Drink clear fluids in small amounts as you are able. Clear fluids include water, ice chips, diluted fruit juice, and low-calorie sports drinks.  Eat bland, easy-to-digest foods in small amounts as you are able. These foods include bananas, applesauce, rice, lean meats, toast, and crackers.  Avoid drinking fluids that contain a lot of sugar or caffeine, such as energy drinks, regular sports drinks, and soda.  Avoid alcohol.  Avoid spicy or fatty foods. General instructions      Take over-the-counter and prescription medicines only as told by your health care provider.  Use a cool mist humidifier to add humidity to the air in your home. This can make it easier to breathe.  Cover your mouth and nose when you cough or sneeze.  Wash your hands with soap and water often, especially after you cough or sneeze. If soap and water are not available, use alcohol-based hand sanitizer.  Keep all follow-up visits as told by your health care provider.  This is important. How is this prevented?   Get an annual flu shot. You may get the flu shot in late summer, fall, or winter. Ask your health care provider when you should get your flu shot.  Avoid contact with people who are sick during cold and flu season. This is generally fall and winter. Contact a health care provider if:  You develop new symptoms.  You have: ? Chest pain. ? Diarrhea. ? A fever.  Your cough gets worse.  You produce more mucus.  You feel nauseous or you vomit. Get help right away if:  You develop shortness of breath  or difficulty breathing.  Your skin or nails turn a bluish color.  You have severe pain or stiffness in your neck.  You develop a sudden headache or sudden pain in your face or ear.  You cannot eat or drink without vomiting. Summary  Influenza, more commonly known as "the flu," is a viral infection that primarily affects your respiratory tract.  Symptoms of the flu usually begin suddenly and last 4-14 days.  Getting an annual flu shot is the best way to prevent getting the flu.  Stay home from work or school as told by your health care provider. Unless you are visiting your health care provider, avoid leaving home until your fever has been gone for 24 hours without taking medicine.  Keep all follow-up visits as told by your health care provider. This is important. This information is not intended to replace advice given to you by your health care provider. Make sure you discuss any questions you have with your health care provider. Document Released: 11/05/2000 Document Revised: 04/26/2018 Document Reviewed: 04/26/2018 Elsevier Interactive Patient Education  2019 Elsevier Inc.    Oseltamivir capsules What is this medicine? OSELTAMIVIR (os el TAM i vir) is an antiviral medicine. It is used to prevent and to treat some kinds of influenza or the flu. It will not work for colds or other viral infections. This medicine may be used for other purposes; ask your health care provider or pharmacist if you have questions. COMMON BRAND NAME(S): Tamiflu What should I tell my health care provider before I take this medicine? They need to know if you have any of the following conditions: -heart disease -immune system problems -kidney disease -liver disease -lung disease -an unusual or allergic reaction to oseltamivir, other medicines, foods, dyes, or preservatives -pregnant or trying to get pregnant -breast-feeding How should I use this medicine? Take this medicine by mouth with a glass  of water. Follow the directions on the prescription label. Start this medicine at the first sign of flu symptoms. You can take it with or without food. If it upsets your stomach, take it with food. Take your medicine at regular intervals. Do not take your medicine more often than directed. Take all of your medicine as directed even if you think you are better. Do not skip doses or stop your medicine early. Talk to your pediatrician regarding the use of this medicine in children. While this drug may be prescribed for children as young as 14 days for selected conditions, precautions do apply. Overdosage: If you think you have taken too much of this medicine contact a poison control center or emergency room at once. NOTE: This medicine is only for you. Do not share this medicine with others. What if I miss a dose? If you miss a dose, take it as soon as you remember. If it is almost time for your  next dose (within 2 hours), take only that dose. Do not take double or extra doses. What may interact with this medicine? - intranasal influenza vaccine This list may not describe all possible interactions. Give your health care provider a list of all the medicines, herbs, non-prescription drugs, or dietary supplements you use. Also tell them if you smoke, drink alcohol, or use illegal drugs. Some items may interact with your medicine. What should I watch for while using this medicine? Visit your doctor or health care professional for regular check ups. Tell your doctor if your symptoms do not start to get better or if they get worse. If you have the flu, you may be at an increased risk of developing seizures, confusion, or abnormal behavior. This occurs early in the illness, and more frequently in children and teens. These events are not common, but may result in accidental injury to the patient. Families and caregivers of patients should watch for signs of unusual behavior and contact a doctor or health care  professional right away if the patient shows signs of unusual behavior. This medicine is not a substitute for the flu shot. Talk to your doctor each year about an annual flu shot. What side effects may I notice from receiving this medicine? Side effects that you should report to your doctor or health care professional as soon as possible: -allergic reactions like skin rash, itching or hives, swelling of the face, lips, or tongue -anxiety, confusion, unusual behavior -breathing problems -hallucination, loss of contact with reality -redness, blistering, peeling or loosening of the skin, including inside the mouth -seizures Side effects that usually do not require medical attention (report to your doctor or health care professional if they continue or are bothersome): -diarrhea -headache -nausea, vomiting -pain This list may not describe all possible side effects. Call your doctor for medical advice about side effects. You may report side effects to FDA at 1-800-FDA-1088. Where should I keep my medicine? Keep out of the reach of children. Store at room temperature between 15 and 30 degrees C (59 and 86 degrees F). Throw away any unused medicine after the expiration date. NOTE: This sheet is a summary. It may not cover all possible information. If you have questions about this medicine, talk to your doctor, pharmacist, or health care provider.  2019 Elsevier/Gold Standard (2017-04-20 16:45:14)

## 2019-01-18 NOTE — Progress Notes (Signed)
Assessment and Plan:  Troy [name] was seen today for acute visit.  Diagnoses and all orders for this visit:  Viral URI with cough Flu swab negative;  Benign exam; Discussed the importance of avoiding unnecessary antibiotic therapy. Suggested symptomatic OTC remedies. Nasal saline spray for congestion. oral steroids offered Follow up as needed. The patient was advised to call immediately if he has any concerning symptoms in the interval. The patient voices understanding of current treatment options and is in agreement with the current care plan.The patient knows to call the clinic with any problems, questions or concerns or go to the ER if any further progression of symptoms. If dyspnea, worsening cough, will obtain CXR.  -     POC Influenza A&B(BINAX/QUICKVUE) -     predniSONE (DELTASONE) 20 MG tablet; 2 tablets daily for 3 days, 1 tablet daily for 4 days. -     promethazine-dextromethorphan (PROMETHAZINE-DM) 6.25-15 MG/5ML syrup; Take 5 mLs by mouth 4 (four) times daily as needed for cough.   Further disposition pending results of labs. Discussed med's effects and SE's.   Over 15 minutes of exam, counseling, chart review, and critical decision making was performed.   Future Appointments  Date Time Provider [place]  02/05/2019  2:00 PM Troy [name], [place] [facility]  05/03/2019  2:30 PM Troy Mutters, PA-C [facility] None  11/21/2019 10:00 AM Troy Pinto, MD [facility] None    ------------------------------------------------------------------------------------------------------------------   HPI BP (!) 144/80   Pulse 70   Temp (!) 97.5 F (36.4 C)   Ht 5\' 10"  (1.778 m)   Wt 213 lb (96.6 kg)   SpO2 98%   BMI 30.56 kg/m   68 y.o.male presents for evaluation of sudden onset of body aches, cough, head ache, fatigue that began yesterday evening; did have some vague feelings of being unwell and fatigued earlier in the day; he endorses sensation of fever last night,  but no fever this AM.  He reports cough is nonproductive, does have some chest aching with cough, denies dyspnea, wheezing. Denies edema of lower extremities, PND. Denies neck stiffness, rash, recent known tick bites.   He has been taking tylenol for symptoms, last dose 2 hours ago.   He did have the flu vaccine this season. He does endorse coworker was ill this past week.   Past Medical History:  Diagnosis Date  . BPH (benign prostatic hyperplasia)      No Known Allergies  Current Outpatient Medications on File Prior to Visit  Medication Sig  . allopurinol (ZYLOPRIM) 100 MG tablet Take 1 tablet (100 mg total) by mouth daily.  . finasteride (PROSCAR) 5 MG tablet TAKE ONE TABLET BY MOUTH DAILY FOR PROSTATE  . meloxicam (MOBIC) 15 MG tablet TAKE ONE TABLET BY MOUTH DAILY (Patient taking differently: as needed. )  . Multiple Vitamin (MULTIVITAMIN) capsule Take by mouth.  . rosuvastatin (CRESTOR) 20 MG tablet Take 1 tablet daily for Cholesterol  . sildenafil (REVATIO) 20 MG tablet TAKE 2-5 TABLETS DAILY AS NEEDED   No current facility-administered medications on file prior to visit.     ROS: all negative except above.   Physical Exam:  BP (!) 144/80   Pulse 70   Temp (!) 97.5 F (36.4 C)   Ht 5\' 10"  (1.778 m)   Wt 213 lb (96.6 kg)   SpO2 98%   BMI 30.56 kg/m   General Appearance: Well nourished, in no apparent distress. Eyes: PERRLA, EOMs, conjunctiva no swelling or erythema Sinuses: No Frontal/maxillary tenderness  ENT/Mouth: Ext aud canals clear, TMs without erythema, bulging. No erythema, swelling, or exudate on post pharynx.  Tonsils not swollen or erythematous. Hearing normal.  Neck: Supple, thyroid normal.  Respiratory: Respiratory effort normal, BS equal bilaterally without rales, rhonchi, wheezing or stridor.  Cardio: RRR with no MRGs. Brisk peripheral pulses without edema.  Abdomen: Soft, + BS.  Non tender, no guarding, rebound, hernias, masses. Lymphatics: Non  tender without lymphadenopathy.  Musculoskeletal: Full ROM, 5/5 strength, normal gait.  Skin: Warm, dry without rashes, lesions, ecchymosis.  Neuro: Cranial nerves intact. Normal muscle tone, no cerebellar symptoms. Sensation intact.  Psych: Awake and oriented X 3, normal affect, Insight and Judgment appropriate.     Troy Ribas, NP 11:23 AM [name]

## 2019-02-05 ENCOUNTER — Ambulatory Visit: Payer: Medicare Other | Admitting: Family Medicine

## 2019-02-09 ENCOUNTER — Encounter: Payer: Self-pay | Admitting: Family Medicine

## 2019-02-13 ENCOUNTER — Ambulatory Visit: Payer: Medicare Other | Admitting: Family Medicine

## 2019-02-19 ENCOUNTER — Other Ambulatory Visit: Payer: Self-pay | Admitting: Internal Medicine

## 2019-03-10 ENCOUNTER — Other Ambulatory Visit: Payer: Self-pay | Admitting: Internal Medicine

## 2019-03-10 DIAGNOSIS — E782 Mixed hyperlipidemia: Secondary | ICD-10-CM

## 2019-05-03 ENCOUNTER — Ambulatory Visit: Payer: Self-pay | Admitting: Physician Assistant

## 2019-05-22 ENCOUNTER — Encounter: Payer: Self-pay | Admitting: Family Medicine

## 2019-05-22 ENCOUNTER — Ambulatory Visit (INDEPENDENT_AMBULATORY_CARE_PROVIDER_SITE_OTHER): Payer: Medicare Other | Admitting: Family Medicine

## 2019-05-22 ENCOUNTER — Other Ambulatory Visit: Payer: Self-pay

## 2019-05-22 VITALS — BP 136/84 | HR 62 | Temp 98.2°F | Ht 70.0 in | Wt 203.0 lb

## 2019-05-22 DIAGNOSIS — N400 Enlarged prostate without lower urinary tract symptoms: Secondary | ICD-10-CM | POA: Insufficient documentation

## 2019-05-22 DIAGNOSIS — B351 Tinea unguium: Secondary | ICD-10-CM

## 2019-05-22 DIAGNOSIS — M1991 Primary osteoarthritis, unspecified site: Secondary | ICD-10-CM

## 2019-05-22 DIAGNOSIS — N529 Male erectile dysfunction, unspecified: Secondary | ICD-10-CM

## 2019-05-22 DIAGNOSIS — E785 Hyperlipidemia, unspecified: Secondary | ICD-10-CM

## 2019-05-22 MED ORDER — TERBINAFINE HCL 250 MG PO TABS: 250.0000 mg | ORAL_TABLET | Freq: Every day | ORAL | 0 refills | Status: AC

## 2019-05-22 NOTE — Patient Instructions (Signed)
It was very nice to see you today!  Please start the lamisil.  You can use compression and ice to your knees.   Come back in December when you would be due for your physical, or sooner as needed.   Take care, Dr Jerline Pain

## 2019-05-22 NOTE — Assessment & Plan Note (Signed)
Continue sildenafil as needed as per urology.

## 2019-05-22 NOTE — Assessment & Plan Note (Signed)
Continue Proscar 5 mg daily as per urology.

## 2019-05-22 NOTE — Progress Notes (Signed)
Chief Complaint:  Troy [name] is a 69 y.o. male who presents today with a chief complaint of onychomycosis and to establish care.   Assessment/Plan:  Onychomycosis We will start 12-week course of Lamisil.  Recent LFTs within normal limits.  Discussed potential side effects.  Follow-up in 6 to 12 weeks for repeat LFTs.  Osteoarthritis Stable.  Continue Mobic as needed.  Recommended compression and ice to bilateral knees and other areas of inflammation.  Continue management per sports medicine.  Dyslipidemia Continue Crestor 20 mg daily.  He will follow-up with me in a few months and we will recheck lipid panel at that time.  Erectile dysfunction Continue sildenafil as needed as per urology.  Benign prostatic hyperplasia Continue Proscar 5 mg daily as per urology.  Preventative Healthcare Patient was instructed to return soon for CPE.    Subjective:  HPI:  Onychomycosis Several year history.  Recently treated with Lamisil with improvement however has recurrence over the last several weeks.  Previously tolerated Lamisil well without side effects.  Symptoms predominantly located in right great toe.  Does not have any symptoms on his left foot.  No pain.  No other treatments tried.  No other obvious alleviating or aggravating factors.  His stable, chronic medical conditions are outlined below:  #Joint pain/osteoarthritis - Follows with sports medicine - On allopurinol 100 mg daily for possible gout and takes meloxicam 15 mg daily as needed -Normally has pain in bilateral knee joints but is overall stable and seems to be improving.  # Dyslipidemia - On crestor 20mg  daily and tolerating well. - ROS: No reported myalgias  # BPH / ED - Follows with urology at [place] 5mg  daily and sildenafil as needed  ROS: Per HPI, otherwise a complete review of systems was negative.   PMH:  The following were reviewed and entered/updated in epic: Past Medical History:   Diagnosis Date  . BPH (benign prostatic hyperplasia)    Patient Active Problem List   Diagnosis Date Noted  . Benign prostatic hyperplasia 05/22/2019  . Erectile dysfunction 05/22/2019  . Gout 01/08/2019  . Degenerative disc disease, lumbar 12/11/2018  . Nonallopathic lesion of sacral region 12/11/2018  . Nonallopathic lesion of lumbar region 12/11/2018  . Nonallopathic lesion of thoracic region 12/11/2018  . Dyslipidemia 08/20/2014  . Osteoarthritis 07/15/2009   Past Surgical History:  Procedure Laterality Date  . COLONOSCOPY  11-24-2005   [facility] ONLY  . EYE SURGERY Bilateral 2005   lasik  . 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 Heart disease Father   . Cancer Brother        Lung Cancer  . Colon cancer Neg Hx   . Colon polyps Neg Hx   . Esophageal cancer Neg Hx   . Rectal cancer Neg Hx   . Stomach cancer Neg Hx   . Prostate cancer Neg Hx     Medications- reviewed and updated Current Outpatient Medications  Medication Sig Dispense Refill  . allopurinol (ZYLOPRIM) 100 MG tablet Take 1 tablet (100 mg total) by mouth daily. 60 tablet 6  . finasteride (PROSCAR) 5 MG tablet TAKE ONE TABLET BY MOUTH DAILY FOR PROSTATE. 90 tablet 0  . meloxicam (MOBIC) 15 MG tablet Take 1 tablet (15 mg total) by mouth daily as needed for pain (avoid with ibuprofen, aleve, okay with tyelnol). 90 tablet 0  . Multiple Vitamin (MULTIVITAMIN)  capsule Take by mouth.    . rosuvastatin (CRESTOR) 20 MG tablet Take 1 tablet Daily for Cholesterol 90 tablet 3  . sildenafil (REVATIO) 20 MG tablet TAKE 2-5 TABLETS DAILY AS NEEDED 90 tablet 10  . terbinafine (LAMISIL) 250 MG tablet Take 1 tablet (250 mg total) by mouth daily. 84 tablet 0   No current facility-administered medications for this visit.     Allergies-reviewed and updated No Known Allergies  Social History   Socioeconomic History  . Marital status: Married    Spouse  name: Not on file  . Number of children: Not on file  . Years of education: Not on file  . Highest education level: Not on file  Occupational History  . Not on file  Social Needs  . Financial resource strain: Not on file  . Food insecurity    Worry: Not on file    Inability: Not on file  . Transportation needs    Medical: Not on file    Non-medical: Not on file  Tobacco Use  . Smoking status: Never Smoker  . Smokeless tobacco: Never Used  Substance and Sexual Activity  . Alcohol use: No    Alcohol/week: 0.0 standard drinks  . Drug use: No  . Sexual activity: Not on file  Lifestyle  . Physical activity    Days per week: Not on file    Minutes per session: Not on file  . Stress: Not on file  Relationships  . Social Herbalist on phone: Not on file    Gets together: Not on file    Attends religious service: Not on file    Active member of club or organization: Not on file    Attends meetings of clubs or organizations: Not on file    Relationship status: Not on file  Other Topics Concern  . Not on file  Social History Narrative  . Not on file          Objective:  Physical Exam: BP 136/84 (BP Location: Left Arm, Patient Position: Sitting, Cuff Size: Normal)   Pulse 62   Temp 98.2 F (36.8 C) (Oral)   Ht 5\' 10"  (1.778 m)   Wt 203 lb (92.1 kg)   SpO2 96%   BMI 29.13 kg/m   Gen: NAD, resting comfortably CV: Regular rate and rhythm with no murmurs appreciated Pulm: Normal work of breathing, clear to auscultation bilaterally with no crackles, wheezes, or rhonchi GI: Normal bowel sounds present. Soft, Nontender, Nondistended. MSK: No edema, cyanosis, or clubbing noted.  Right great toenail with onychomycosis.  Bilateral knees with no deformities.  Crepitus with active range of motion.  Stable to varus and valgus stress. Skin: Warm, dry Neuro: Grossly normal, moves all extremities Psych: Normal affect and thought content      Caleb M. Jerline Pain, MD  05/22/2019 3:25 PM

## 2019-05-22 NOTE — Assessment & Plan Note (Signed)
Stable.  Continue Mobic as needed.  Recommended compression and ice to bilateral knees and other areas of inflammation.  Continue management per sports medicine.

## 2019-05-22 NOTE — Assessment & Plan Note (Signed)
Continue Crestor 20 mg daily.  He will follow-up with me in a few months and we will recheck lipid panel at that time.

## 2019-09-06 ENCOUNTER — Other Ambulatory Visit: Payer: Self-pay

## 2019-09-06 ENCOUNTER — Ambulatory Visit (INDEPENDENT_AMBULATORY_CARE_PROVIDER_SITE_OTHER): Payer: Medicare Other | Admitting: Family Medicine

## 2019-09-06 ENCOUNTER — Encounter: Payer: Self-pay | Admitting: Family Medicine

## 2019-09-06 ENCOUNTER — Other Ambulatory Visit (INDEPENDENT_AMBULATORY_CARE_PROVIDER_SITE_OTHER): Payer: Medicare Other

## 2019-09-06 DIAGNOSIS — R05 Cough: Secondary | ICD-10-CM | POA: Diagnosis not present

## 2019-09-06 DIAGNOSIS — E785 Hyperlipidemia, unspecified: Secondary | ICD-10-CM | POA: Diagnosis not present

## 2019-09-06 DIAGNOSIS — R5383 Other fatigue: Secondary | ICD-10-CM | POA: Diagnosis not present

## 2019-09-06 DIAGNOSIS — R059 Cough, unspecified: Secondary | ICD-10-CM

## 2019-09-06 LAB — LIPID PANEL
Cholesterol: 123 mg/dL (ref 0–200)
HDL: 36.3 mg/dL — ABNORMAL LOW (ref >39.00)
NonHDL: 86.34
Total CHOL/HDL Ratio: 3
Triglycerides: 205 mg/dL — ABNORMAL HIGH (ref 0.0–149.0)
VLDL: 41 mg/dL — ABNORMAL HIGH (ref 0.0–40.0)

## 2019-09-06 LAB — CBC
HCT: 43.2 % (ref 39.0–52.0)
Hemoglobin: 14.7 g/dL (ref 13.0–17.0)
MCHC: 34.1 g/dL (ref 30.0–36.0)
MCV: 93.5 fl (ref 78.0–100.0)
Platelets: 238 10*3/uL (ref 150.0–400.0)
RBC: 4.62 Mil/uL (ref 4.22–5.81)
RDW: 12.9 % (ref 11.5–15.5)
WBC: 7 10*3/uL (ref 4.0–10.5)

## 2019-09-06 LAB — COMPREHENSIVE METABOLIC PANEL
ALT: 32 U/L (ref 0–53)
AST: 27 U/L (ref 0–37)
Albumin: 4.7 g/dL (ref 3.5–5.2)
Alkaline Phosphatase: 57 U/L (ref 39–117)
BUN: 12 mg/dL (ref 6–23)
CO2: 26 mEq/L (ref 19–32)
Calcium: 9.4 mg/dL (ref 8.4–10.5)
Chloride: 103 mEq/L (ref 96–112)
Creatinine, Ser: 1.02 mg/dL (ref 0.40–1.50)
GFR: 72.48 mL/min (ref >60.00)
Glucose, Bld: 92 mg/dL (ref 70–99)
Potassium: 4.6 mEq/L (ref 3.5–5.1)
Sodium: 136 mEq/L (ref 135–145)
Total Bilirubin: 0.4 mg/dL (ref 0.2–1.2)
Total Protein: 7 g/dL (ref 6.0–8.3)

## 2019-09-06 LAB — VITAMIN B12: Vitamin B-12: 281 pg/mL (ref 211–911)

## 2019-09-06 LAB — SARS-COV-2 IGG: SARS-COV-2 IgG: 0.02

## 2019-09-06 LAB — LDL CHOLESTEROL, DIRECT: Direct LDL: 67 mg/dL

## 2019-09-06 LAB — VITAMIN D 25 HYDROXY (VIT D DEFICIENCY, FRACTURES): VITD: 39.64 ng/mL (ref 30.00–100.00)

## 2019-09-06 LAB — TSH: TSH: 1.8 u[IU]/mL (ref 0.35–4.50)

## 2019-09-06 NOTE — Progress Notes (Signed)
   Chief Complaint:  Troy [name] is a 68 y.o. male who presents today for a virtual office visit with a chief complaint of fatigue.   Assessment/Plan:  Fatigue New problem. Likely multifactorial.  Patient will come in for labs including CBC, C met, TSH, B12, vitamin D, and Covid antibody.  If labs are negative, will consider trial off statin.  If still no improvement, will likely need sleep study.    Dyslipidemia Continue Crestor 20 mg daily.  Check lipid panel with blood draw.  As noted above, will consider trial off Crestor to see if this helps with his brain fog/fatigue depending on his labs.     Subjective:  HPI:  Fatigue / Brain Fog Started several months ago.  They seem to be intermittent in nature.  Patient feels more lethargic and fatigued throughout the day.  He does note that he does not get an adequate amount of sleep.  Symptoms seem to come and go.  He has a hard time waking up in the morning.  He would nap most days.  Is also has some issues with forgetfulness as well.  He has been trying to take super food supplements which seems to be helping.  He is currently eating a vegan diet.  Does not take any extra B12 supplementation.  No weakness or numbness.  No tingling.  No fevers or chills.  He had a upper respiratory infection earlier this year which she thinks could have been Covid.  He had abnormal taste for quite a while afterwards.  He thinks that this could have potentially been the source of his current symptoms.  No other obvious aggravating or alleviating factors.  ROS: Per HPI  PMH: He reports that he has never smoked. He has never used smokeless tobacco. He reports that he does not drink alcohol or use drugs.      Objective/Observations  Physical Exam: Gen: NAD, resting comfortably Pulm: Normal work of breathing Neuro: Grossly normal, moves all extremities Psych: Normal affect and thought content  No results found for this or any previous visit (from the past 24  hour(s)).   Virtual Visit via Video   I connected with Troy [name] on 09/06/19 at 11:00 AM EDT by a video enabled telemedicine application and verified that I am speaking with the correct person using two identifiers. I discussed the limitations of evaluation and management by telemedicine and the availability of in person appointments. The patient expressed understanding and agreed to proceed.   Patient location: Home Provider location: [place] participating in the virtual visit: Myself and Patient     Algis Greenhouse. Jerline Pain, MD 09/06/2019 10:52 AM

## 2019-09-06 NOTE — Assessment & Plan Note (Signed)
Continue Crestor 20 mg daily.  Check lipid panel with blood draw.  As noted above, will consider trial off Crestor to see if this helps with his brain fog/fatigue depending on his labs.

## 2019-09-07 NOTE — Progress Notes (Signed)
Please inform patient of the following:  Labs with no clear explanation for his brain fog/fatigue. His triglycerides were a bit high but everything else is NORMAL. Would like for patient to stop his statin for a few weeks and let us know if his symptoms do not improve.  Algis Greenhouse. Jerline Pain, MD 09/07/2019 2:36 PM

## 2019-10-10 ENCOUNTER — Other Ambulatory Visit: Payer: Self-pay | Admitting: Physician Assistant

## 2019-10-23 ENCOUNTER — Ambulatory Visit: Payer: Medicare Other

## 2019-10-26 ENCOUNTER — Other Ambulatory Visit: Payer: Self-pay | Admitting: Physician Assistant

## 2019-10-26 ENCOUNTER — Other Ambulatory Visit: Payer: Self-pay | Admitting: Internal Medicine

## 2019-11-05 ENCOUNTER — Ambulatory Visit (INDEPENDENT_AMBULATORY_CARE_PROVIDER_SITE_OTHER): Payer: Medicare Other | Admitting: Family Medicine

## 2019-11-05 ENCOUNTER — Other Ambulatory Visit: Payer: Self-pay

## 2019-11-05 VITALS — BP 125/82 | HR 60 | Temp 97.2°F | Ht 70.0 in | Wt 203.0 lb

## 2019-11-05 DIAGNOSIS — N400 Enlarged prostate without lower urinary tract symptoms: Secondary | ICD-10-CM | POA: Diagnosis not present

## 2019-11-05 DIAGNOSIS — Z23 Encounter for immunization: Secondary | ICD-10-CM | POA: Diagnosis not present

## 2019-11-05 DIAGNOSIS — N529 Male erectile dysfunction, unspecified: Secondary | ICD-10-CM | POA: Diagnosis not present

## 2019-11-05 DIAGNOSIS — M109 Gout, unspecified: Secondary | ICD-10-CM | POA: Diagnosis not present

## 2019-11-05 DIAGNOSIS — E785 Hyperlipidemia, unspecified: Secondary | ICD-10-CM | POA: Diagnosis not present

## 2019-11-05 DIAGNOSIS — E663 Overweight: Secondary | ICD-10-CM

## 2019-11-05 DIAGNOSIS — Z125 Encounter for screening for malignant neoplasm of prostate: Secondary | ICD-10-CM | POA: Diagnosis not present

## 2019-11-05 DIAGNOSIS — Z6829 Body mass index (BMI) 29.0-29.9, adult: Secondary | ICD-10-CM

## 2019-11-05 DIAGNOSIS — Z0001 Encounter for general adult medical examination with abnormal findings: Secondary | ICD-10-CM

## 2019-11-05 LAB — LIPID PANEL
Cholesterol: 111 mg/dL (ref 0–200)
HDL: 34.4 mg/dL — ABNORMAL LOW (ref >39.00)
LDL Cholesterol: 59 mg/dL (ref 0–99)
NonHDL: 76.91
Total CHOL/HDL Ratio: 3
Triglycerides: 92 mg/dL (ref 0.0–149.0)
VLDL: 18.4 mg/dL (ref 0.0–40.0)

## 2019-11-05 LAB — URIC ACID: Uric Acid, Serum: 8.6 mg/dL — ABNORMAL HIGH (ref 4.0–7.8)

## 2019-11-05 LAB — PSA: PSA: 3.09 ng/mL (ref 0.10–4.00)

## 2019-11-05 MED ORDER — TETANUS-DIPHTH-ACELL PERTUSSIS 5-2.5-18.5 LF-MCG/0.5 IM SUSP: 0.5000 mL | Freq: Once | INTRAMUSCULAR | Status: DC

## 2019-11-05 NOTE — Assessment & Plan Note (Signed)
Stable.  Check uric acid level.

## 2019-11-05 NOTE — Assessment & Plan Note (Signed)
Stable. Continue sildenafil as needed. 

## 2019-11-05 NOTE — Progress Notes (Signed)
Chief Complaint:  Troy [name] is a 68 y.o. male who presents today for his annual comprehensive physical exam and initial medicare annual wellness visit.    Assessment/Plan:  Dyslipidemia Check lipids.  Continue lifestyle modification.  Erectile dysfunction Stable.  Continue sildenafil as needed.  Benign prostatic hyperplasia Stable.  Continue Proscar 5 mg daily.  Gout Stable.  Check uric acid level.   Body mass index is 29.13 kg/m. / Overweight BMI Metric Follow Up - 11/05/19 1341      BMI Metric Follow Up-Please document annually   BMI Metric Follow Up  Education provided        Preventative Healthcare: Check PSA and lipids. Tdap given today. Flu vaccine already given this year. Due for next colonoscopy 2022. UTD on other vaccines and screenings.   Patient Counseling(The following topics were reviewed and/or handout was given):  -Nutrition: Stressed importance of moderation in sodium/caffeine intake, saturated fat and cholesterol, caloric balance, sufficient intake of fresh fruits, vegetables, and fiber.  -Stressed the importance of regular exercise.   -Substance Abuse: Discussed cessation/primary prevention of tobacco, alcohol, or other drug use; driving or other dangerous activities under the influence; availability of treatment for abuse.   -Injury prevention: Discussed safety belts, safety helmets, smoke detector, smoking near bedding or upholstery.   -Sexuality: Discussed sexually transmitted diseases, partner selection, use of condoms, avoidance of unintended pregnancy and contraceptive alternatives.   -Dental health: Discussed importance of regular tooth brushing, flossing, and dental visits.  -Health maintenance and immunizations reviewed. Please refer to Health maintenance section.  During the course of the visit the patient was educated and counseled about appropriate screening and preventive services including:        Fall prevention   Nutrition Physical  Activity Weight Management Cognition  Has an advanced directive.   Return to care in 1 year for next preventative visit.     Subjective:  HPI:  Health Risk Assessment: Patient considers his overall health to be good. He has no difficulty performing the following: . Preparing food and eating . Bathing  . Getting dressed . Using the toilet . Shopping . Managing Finances . Moving around from place to place  He has not had any falls within the past year.   Depression screen PHQ 2/9 11/05/2019  Decreased Interest 0  Down, Depressed, Hopeless 0  PHQ - 2 Score 0   Lifestyle Factors: Diet: Tries to limit meat.  Exercise: Likes to walk and ride bikes. Works out at least 5-6 times per week.   Patient Care Team: Vivi Barrack, MD as PCP - General (Family Medicine)   His chronic medical conditions are outlined below:  # ED - On sildenafil as needed and tolerating well without side effects  # BPH - On finasteride 5mg  daily and tolerating well  # Dyslipidemia - Not currently on statin   ROS: Per HPI, otherwise a complete review of systems was negative.   PMH:  The following were reviewed and entered/updated in epic: Past Medical History:  Diagnosis Date  . BPH (benign prostatic hyperplasia)    Patient Active Problem List   Diagnosis Date Noted  . Benign prostatic hyperplasia 05/22/2019  . Erectile dysfunction 05/22/2019  . Gout 01/08/2019  . Degenerative disc disease, lumbar 12/11/2018  . Nonallopathic lesion of sacral region 12/11/2018  . Nonallopathic lesion of lumbar region 12/11/2018  . Nonallopathic lesion of thoracic region 12/11/2018  . Dyslipidemia 08/20/2014  . Osteoarthritis 07/15/2009   Past Surgical History:  Procedure  Laterality Date  . COLONOSCOPY  11-24-2005   STARK-TICS ONLY  . EYE SURGERY Bilateral 2005   lasik  . 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 Heart disease Father    . Cancer Brother        Lung Cancer  . Colon cancer Neg Hx   . Colon polyps Neg Hx   . Esophageal cancer Neg Hx   . Rectal cancer Neg Hx   . Stomach cancer Neg Hx   . Prostate cancer Neg Hx    Medications- reviewed and updated Current Outpatient Medications  Medication Sig Dispense Refill  . finasteride (PROSCAR) 5 MG tablet Take 1 tablet Daily for Prostate 90 tablet 3  . meloxicam (MOBIC) 15 MG tablet Take 1 tablet Daily with Food for Pain & Inflammation 90 tablet 3  . Multiple Vitamin (MULTIVITAMIN) capsule Take by mouth.    . sildenafil (REVATIO) 20 MG tablet Take 2 to 5 tablets Daily as needed for XXXX 90 tablet 9   Current Facility-Administered Medications  Medication Dose Route Frequency Provider Last Rate Last Admin  . Tdap (BOOSTRIX) injection 0.5 mL  0.5 mL Intramuscular Once Vivi Barrack, MD        Allergies-reviewed and updated No Known Allergies  Social History   Socioeconomic History  . Marital status: Married    Spouse name: Not on file  . Number of children: Not on file  . Years of education: Not on file  . Highest education level: Not on file  Occupational History  . Not on file  Tobacco Use  . Smoking status: Never Smoker  . Smokeless tobacco: Never Used  Substance and Sexual Activity  . Alcohol use: No    Alcohol/week: 0.0 standard drinks  . Drug use: No  . Sexual activity: Not on file  Other Topics Concern  . Not on file  Social History Narrative  . Not on file   Social Determinants of Health   Financial Resource Strain:   . Difficulty of Paying Living Expenses: Not on file  Food Insecurity:   . Worried About Charity fundraiser in the Last Year: Not on file  . Ran Out of Food in the Last Year: Not on file  Transportation Needs:   . Lack of Transportation (Medical): Not on file  . Lack of Transportation (Non-Medical): Not on file  Physical Activity:   . Days of Exercise per Week: Not on file  . Minutes of Exercise per Session: Not  on file  Stress:   . Feeling of Stress : Not on file  Social Connections:   . Frequency of Communication with Friends and Family: Not on file  . Frequency of Social Gatherings with Friends and Family: Not on file  . Attends Religious Services: Not on file  . Active Member of Clubs or Organizations: Not on file  . Attends Archivist Meetings: Not on file  . Marital Status: Not on file        Objective:  Physical Exam: BP 125/82   Pulse 60   Temp (!) 97.2 F (36.2 C)   Ht 5\' 10"  (1.778 m)   Wt 203 lb (92.1 kg)   SpO2 97%   BMI 29.13 kg/m   Body mass index is 29.13 kg/m. Wt Readings from Last 3 Encounters:  11/05/19 203 lb (92.1 kg)  05/22/19 203 lb (92.1 kg)  01/18/19 213  lb (96.6 kg)   Gen: NAD, resting comfortably HEENT: TMs normal bilaterally. OP clear. No thyromegaly noted.  CV: RRR with no murmurs appreciated Pulm: NWOB, CTAB with no crackles, wheezes, or rhonchi GI: Normal bowel sounds present. Soft, Nontender, Nondistended. MSK: no edema, cyanosis, or clubbing noted Skin: warm, dry Neuro: CN2-12 grossly intact. Strength 5/5 in upper and lower extremities. Reflexes symmetric and intact bilaterally. Normal minicog.  Psych: Normal affect and thought content     Laterrica Libman M. Jerline Pain, MD 11/05/2019 1:43 PM

## 2019-11-05 NOTE — Assessment & Plan Note (Signed)
Check lipids.  Continue lifestyle modification.

## 2019-11-05 NOTE — Patient Instructions (Addendum)
It was very nice to see you today!  Keep up the good work!  We will check blood work.  Come back in 1 year for your next physical, or sooner if needed.   Take care, Dr Jerline Pain  Please try these tips to maintain a healthy lifestyle:   Eat at least 3 REAL meals and 1-2 snacks per day.  Aim for no more than 5 hours between eating.  If you eat breakfast, please do so within one hour of getting up.    Each meal should contain half fruits/vegetables, one quarter protein, and one quarter carbs (no bigger than a computer mouse)   Cut down on sweet beverages. This includes juice, soda, and sweet tea.     Drink at least 1 glass of water with each meal and aim for at least 8 glasses per day   Exercise at least 150 minutes every week.    Preventive Care 52 Years and Older, Male Preventive care refers to lifestyle choices and visits with your health care provider that can promote health and wellness. This includes:  A yearly physical exam. This is also called an annual well check.  Regular dental and eye exams.  Immunizations.  Screening for certain conditions.  Healthy lifestyle choices, such as diet and exercise. What can I expect for my preventive care visit? Physical exam Your health care provider will check:  Height and weight. These may be used to calculate body mass index (BMI), which is a measurement that tells if you are at a healthy weight.  Heart rate and blood pressure.  Your skin for abnormal spots. Counseling Your health care provider may ask you questions about:  Alcohol, tobacco, and drug use.  Emotional well-being.  Home and relationship well-being.  Sexual activity.  Eating habits.  History of falls.  Memory and ability to understand (cognition).  Work and work Statistician. What immunizations do I need?  Influenza (flu) vaccine  This is recommended every year. Tetanus, diphtheria, and pertussis (Tdap) vaccine  You may need a Td booster  every 10 years. Varicella (chickenpox) vaccine  You may need this vaccine if you have not already been vaccinated. Zoster (shingles) vaccine  You may need this after age 41. Pneumococcal conjugate (PCV13) vaccine  One dose is recommended after age 60. Pneumococcal polysaccharide (PPSV23) vaccine  One dose is recommended after age 45. Measles, mumps, and rubella (MMR) vaccine  You may need at least one dose of MMR if you were born in 1957 or later. You may also need a second dose. Meningococcal conjugate (MenACWY) vaccine  You may need this if you have certain conditions. Hepatitis A vaccine  You may need this if you have certain conditions or if you travel or work in places where you may be exposed to hepatitis A. Hepatitis B vaccine  You may need this if you have certain conditions or if you travel or work in places where you may be exposed to hepatitis B. Haemophilus influenzae type b (Hib) vaccine  You may need this if you have certain conditions. You may receive vaccines as individual doses or as more than one vaccine together in one shot (combination vaccines). Talk with your health care provider about the risks and benefits of combination vaccines. What tests do I need? Blood tests  Lipid and cholesterol levels. These may be checked every 5 years, or more frequently depending on your overall health.  Hepatitis C test.  Hepatitis B test. Screening  Lung cancer screening. You may  have this screening every year starting at age 91 if you have a 30-pack-year history of smoking and currently smoke or have quit within the past 15 years.  Colorectal cancer screening. All adults should have this screening starting at age 50 and continuing until age 72. Your health care provider may recommend screening at age 67 if you are at increased risk. You will have tests every 1-10 years, depending on your results and the type of screening test.  Prostate cancer screening.  Recommendations will vary depending on your family history and other risks.  Diabetes screening. This is done by checking your blood sugar (glucose) after you have not eaten for a while (fasting). You may have this done every 1-3 years.  Abdominal aortic aneurysm (AAA) screening. You may need this if you are a current or former smoker.  Sexually transmitted disease (STD) testing. Follow these instructions at home: Eating and drinking  Eat a diet that includes fresh fruits and vegetables, whole grains, lean protein, and low-fat dairy products. Limit your intake of foods with high amounts of sugar, saturated fats, and salt.  Take vitamin and mineral supplements as recommended by your health care provider.  Do not drink alcohol if your health care provider tells you not to drink.  If you drink alcohol: ? Limit how much you have to 0-2 drinks a day. ? Be aware of how much alcohol is in your drink. In the [place] one drink equals one 12 oz bottle of beer (355 mL), one 5 oz glass of wine (148 mL), or one 1 oz glass of hard liquor (44 mL). Lifestyle  Take daily care of your teeth and gums.  Stay active. Exercise for at least 30 minutes on 5 or more days each week.  Do not use any products that contain nicotine or tobacco, such as cigarettes, e-cigarettes, and chewing tobacco. If you need help quitting, ask your health care provider.  If you are sexually active, practice safe sex. Use a condom or other form of protection to prevent STIs (sexually transmitted infections).  Talk with your health care provider about taking a low-dose aspirin or statin. What's next?  Visit your health care provider once a year for a well check visit.  Ask your health care provider how often you should have your eyes and teeth checked.  Stay up to date on all vaccines. This information is not intended to replace advice given to you by your health care provider. Make sure you discuss any questions you have with  your health care provider. Document Released: 12/05/2015 Document Revised: 11/02/2018 Document Reviewed: 11/02/2018 Elsevier Patient Education  2020 [employer].

## 2019-11-05 NOTE — Assessment & Plan Note (Signed)
Stable.  Continue Proscar 5 mg daily.

## 2019-11-06 NOTE — Progress Notes (Signed)
Please inform patient of the following:  Good news! Blood work is all STABLE. Do not need to make any changes to his treatment plan at this time.  Would like for him to keep up the good work and we can recheck in a year or so.

## 2019-11-21 ENCOUNTER — Encounter: Payer: Self-pay | Admitting: Internal Medicine

## 2019-12-11 ENCOUNTER — Encounter: Payer: Self-pay | Admitting: Family Medicine

## 2019-12-11 ENCOUNTER — Other Ambulatory Visit: Payer: Self-pay

## 2019-12-14 ENCOUNTER — Encounter: Payer: Self-pay | Admitting: Family Medicine

## 2019-12-14 ENCOUNTER — Other Ambulatory Visit: Payer: Self-pay

## 2019-12-14 DIAGNOSIS — M5136 Other intervertebral disc degeneration, lumbar region: Secondary | ICD-10-CM

## 2019-12-15 ENCOUNTER — Encounter: Payer: Self-pay | Admitting: Family Medicine

## 2019-12-21 ENCOUNTER — Ambulatory Visit: Payer: Medicare Other

## 2019-12-24 ENCOUNTER — Ambulatory Visit: Payer: Medicare PPO | Admitting: Physical Therapy

## 2019-12-24 ENCOUNTER — Encounter: Payer: Self-pay | Admitting: Physical Therapy

## 2019-12-24 ENCOUNTER — Other Ambulatory Visit: Payer: Self-pay

## 2019-12-24 DIAGNOSIS — M25652 Stiffness of left hip, not elsewhere classified: Secondary | ICD-10-CM | POA: Diagnosis not present

## 2019-12-24 DIAGNOSIS — G8929 Other chronic pain: Secondary | ICD-10-CM

## 2019-12-24 DIAGNOSIS — M545 Low back pain: Secondary | ICD-10-CM

## 2019-12-24 NOTE — Patient Instructions (Signed)
Access Code: [email or web address]  URL: [email or web address]  Date: 12/24/2019  Prepared by: Lyndee Hensen   Exercises Supine Posterior Pelvic Tilt - 3 reps - 30 hold - 2x daily Single Knee to Chest Stretch - 3 reps - 30 hold - 2x daily Supine Lower Trunk Rotation - 3 reps - 30 hold - 2x daily Seated Hamstring Stretch - 3 reps - 30 hold - 2x daily Correct Seated Posture

## 2019-12-24 NOTE — Therapy (Signed)
[facility] [address] [place], [place], [address] Phone: [phone]   Fax:  [phone]  Physical Therapy Evaluation  Patient Details  Name: Troy [name] MRN: [id number] Date of Birth: 1951/07/30 Referring Provider (PT): Dimas Chyle   Encounter Date: 12/24/2019  PT End of Session - 12/24/19 1551    Visit Number  1    Number of Visits  12    Date for PT Re-Evaluation  02/04/20    Authorization Type  [employer]    PT Start Time  [id number]    PT Stop Time  1425    PT Time Calculation (min)  40 min    Activity Tolerance  Patient tolerated treatment well    Behavior During Therapy  [facility] for tasks assessed/performed       Past Medical History:  Diagnosis Date  . BPH (benign prostatic hyperplasia)     Past Surgical History:  Procedure Laterality Date  . COLONOSCOPY  11-24-2005   STARK-TICS ONLY  . EYE SURGERY Bilateral 2005   lasik  . JOINT REPLACEMENT Left 2007   Lt THR - Dr Ronnie Derby    There were no vitals filed for this visit.   Subjective Assessment - 12/24/19 1350    Subjective  Pt states stiffness in L hip, THA in 2007. He has had mild back pain in the past, is ok at the moment. He is retired, active at home, likes to exercise. Currently not doing much specifically for back or hip, interested in updated HEP. Also has questions on posture, feels like he is "leaning forward more". OVerall feels stiff, and tight with functional movment, in AM, and with bending.    Currently in Pain?  No/denies    Pain Score  0-No pain         [facility] PT Assessment - 12/24/19 1443      Assessment   Medical Diagnosis  Back pain, Leg stiffness    Referring Provider (PT)  Dimas Chyle    Prior Therapy  no      Balance Screen   Has the patient fallen in the past 6 months  No      Prior Function   Level of Independence  Independent      Cognition   Overall Cognitive Status  Within Functional Limits for tasks assessed      Posture/Postural Control   Posture Comments  poor seated posture, with ppt, decreased standing posture, with mild forward head and rounded shoulders       AROM   AROM Assessment Site  Hip    Right/Left Hip  Right;Left    Right Hip Flexion  105    Left Hip Flexion  90    Left Hip External Rotation   --   wfl   Left Hip Internal Rotation   --   wfl   Left Hip ABduction  --   wfl   Lumbar Flexion  mild limitation    Lumbar Extension  mod limitation    Lumbar - Right Side Bend  WFL    Lumbar - Left Side Bend  [facility]      Strength   Overall Strength Comments  hips: 4+/5, Knee: 5/5,       Palpation   Palpation comment  Hypomobile L hip (flexion), mild stifffness and hypomobility in lumbar spine.                 Objective measurements completed on examination: See above findings.  [facility] PT Treatment/Exercise - 12/24/19 1443      Exercises   Exercises  Knee/Hip      Knee/Hip Exercises: Stretches   Active Hamstring Stretch  2 reps;30 seconds    Active Hamstring Stretch Limitations  seated with education on straight back with lean     Other Knee/Hip Stretches  SKTC 30 sec x 3 bil; Lower trunk rotation x10;       Knee/Hip Exercises: Supine   Other Supine Knee/Hip Exercises  Pelvic tilts x15             PT Education - 12/24/19 1551    Education Details  HEP, PT POC, Exam findings.    Person(s) Educated  Patient    Methods  Explanation;Demonstration;Tactile cues;Verbal cues;Handout    Comprehension  Verbalized understanding;Returned demonstration;Verbal cues required;Tactile cues required;Need further instruction       PT Short Term Goals - 12/24/19 2057      PT SHORT TERM GOAL #1   Title  Pt to be independent wtih initial HEP    Time  2    Period  Weeks    Status  New    Target Date  01/07/20        PT Long Term Goals - 12/24/19 2057      PT LONG TERM GOAL #1   Title  Pt to be independent with final HEP for back and hips    Time  6     Period  Weeks    Status  New    Target Date  02/04/20      PT LONG TERM GOAL #2   Title  Pt to verbalize best exercises for improving hip flexion ROM and stiffness.    Time  6    Period  Weeks    Status  New    Target Date  02/04/20      PT LONG TERM GOAL #3   Title  Pt to demo proper seated, standing, posture, as well as optimal posture with bend/lift/squat, for decreased pain in back with functional activity    Time  6    Period  Weeks    Status  New    Target Date  02/04/20             Plan - 12/24/19 1610    Clinical Impression Statement  Pt presents with deficits in L hip and low back. Pt with hypomobile L hip, and lumbar spine, with decreased ROM. He has poor seated posture, some due to stiffness, also with decreased ability for hip hinge, fwd lean, and poor squat, bend mechanics with functional activity. Pt with lack of effective HEP for his deficits. Pt to benefit from skilled PT to improve deficits and mobility.    Examination-Activity Limitations  Bend;Sit;Squat    Examination-Participation Restrictions  Cleaning;Community Activity;Shop;Yard Work    Stability/Clinical Decision Making  Stable/Uncomplicated    Clinical Decision Making  Low    Rehab Potential  Good    PT Frequency  1x / week    PT Duration  6 weeks    PT Treatment/Interventions  ADLs/Self Care Home Management;Cryotherapy;Electrical Stimulation;Iontophoresis 4mg /ml Dexamethasone;Moist Heat;Ultrasound;Traction;Neuromuscular re-education;Therapeutic exercise;Therapeutic activities;Functional mobility training;Gait training;Stair training;Patient/family education;Manual techniques;Dry needling;Passive range of motion;Taping;Spinal Manipulations;Joint Manipulations    Consulted and Agree with Plan of Care  Patient       Patient will benefit from skilled therapeutic intervention in order to improve the following deficits and impairments:  Decreased range of motion, Decreased activity tolerance, Hypomobility,  Impaired flexibility, Improper body mechanics, Decreased strength, Decreased mobility  Visit Diagnosis: Stiffness of hip joint, left  Chronic bilateral low back pain without sciatica     Problem List Patient Active Problem List   Diagnosis Date Noted  . Benign prostatic hyperplasia 05/22/2019  . Erectile dysfunction 05/22/2019  . Gout 01/08/2019  . Degenerative disc disease, lumbar 12/11/2018  . Nonallopathic lesion of sacral region 12/11/2018  . Nonallopathic lesion of lumbar region 12/11/2018  . Nonallopathic lesion of thoracic region 12/11/2018  . Dyslipidemia 08/20/2014  . Osteoarthritis 07/15/2009    Lyndee Hensen, PT, DPT 9:18 PM  12/24/19    [facility] [place], [place], [address] Phone: [phone]   Fax:  [phone]  Name: Troy [name] MRN: [id number] Date of Birth: 08-23-1951

## 2019-12-29 ENCOUNTER — Ambulatory Visit: Payer: Medicare PPO | Attending: Internal Medicine

## 2019-12-29 DIAGNOSIS — Z23 Encounter for immunization: Secondary | ICD-10-CM

## 2019-12-29 NOTE — Progress Notes (Signed)
   Covid-19 Vaccination Clinic  Name:  Troy [name]    MRN: [id number] DOB: Apr 13, 1951  12/29/2019  [name] was observed post Covid-19 immunization for 15 minutes without incidence. He was provided with Vaccine Information Sheet and instruction to access the V-Safe system.   [name] was instructed to call 911 with any severe reactions post vaccine: Marland Kitchen Difficulty breathing  . Swelling of your face and throat  . A fast heartbeat  . A bad rash all over your body  . Dizziness and weakness    Immunizations Administered    Name Date Dose VIS Date Route   Pfizer COVID-19 Vaccine 12/29/2019  2:23 PM 0.3 mL 11/02/2019 Intramuscular   Manufacturer: [place]   Lot: [id number]   [place]: [id number]

## 2020-01-01 ENCOUNTER — Other Ambulatory Visit: Payer: Self-pay

## 2020-01-02 ENCOUNTER — Encounter: Payer: Self-pay | Admitting: Physical Therapy

## 2020-01-02 ENCOUNTER — Ambulatory Visit: Payer: Medicare PPO | Admitting: Physical Therapy

## 2020-01-02 DIAGNOSIS — M25652 Stiffness of left hip, not elsewhere classified: Secondary | ICD-10-CM

## 2020-01-02 DIAGNOSIS — G8929 Other chronic pain: Secondary | ICD-10-CM | POA: Diagnosis not present

## 2020-01-02 DIAGNOSIS — M545 Low back pain: Secondary | ICD-10-CM | POA: Diagnosis not present

## 2020-01-02 NOTE — Patient Instructions (Signed)
Access Code: [email or web address]  URL: [email or web address]  Date: 01/02/2020  Prepared by: Lyndee Hensen   Exercises Supine Posterior Pelvic Tilt - 3 reps - 30 hold - 2x daily Single Knee to Chest Stretch - 3 reps - 30 hold - 2x daily Supine Lower Trunk Rotation - 3 reps - 30 hold - 2x daily Seated Hamstring Stretch - 3 reps - 30 hold - 2x daily Correct Seated Posture Standing Backward Shoulder Rolls - 10 reps - 1 sets - 3x daily Seated Cervical Retraction - 10 reps - 1 sets - 3x daily Standing Scapular Retraction - 10 reps - 1 sets - 3x daily Scapular Retraction with Resistance - 10 reps - 2 sets - 1x daily Supine Pectoralis Stretch - 3 reps - 30 hold - 2x daily

## 2020-01-02 NOTE — Therapy (Signed)
[facility] [address] [place], [place], [address] Phone: [phone]   Fax:  [phone]  Physical Therapy Treatment  Patient Details  Name: Troy [name] MRN: [id number] Date of Birth: [birth year] Referring Provider (PT): Dimas Chyle   Encounter Date: 01/02/2020  PT End of Session - 01/02/20 1116    Visit Number  2    Number of Visits  12    Date for PT Re-Evaluation  02/04/20    Authorization Type  [employer] Medicare    PT Start Time  1018    PT Stop Time  1100    PT Time Calculation (min)  42 min    Activity Tolerance  Patient tolerated treatment well    Behavior During Therapy  [facility] for tasks assessed/performed       Past Medical History:  Diagnosis Date  . BPH (benign prostatic hyperplasia)     Past Surgical History:  Procedure Laterality Date  . COLONOSCOPY  11-24-2005   STARK-TICS ONLY  . EYE SURGERY Bilateral 2005   lasik  . JOINT REPLACEMENT Left 2007   Lt THR - Dr Ronnie Derby    There were no vitals filed for this visit.  Subjective Assessment - 01/02/20 1200    Subjective  Pt with no new complaints. He has been doing HEP. Has questions about posture    Currently in Pain?  No/denies    Pain Score  0-No pain                       [facility] Adult PT Treatment/Exercise - 01/02/20 1022      Posture/Postural Control   Posture Comments  poor seated posture, with ppt, decreased standing posture, with mild forward head and rounded shoulders       Exercises   Exercises  Knee/Hip;Neck      Neck Exercises: Seated   Shoulder Rolls  20 reps    Other Seated Exercise  Chin tucks x15;  Scap retract x15;   Rows GTB x20;       Knee/Hip Exercises: Stretches   Active Hamstring Stretch  2 reps;30 seconds    Active Hamstring Stretch Limitations  seated     Other Knee/Hip Stretches  SKTC 30 sec x 3 bil;     Other Knee/Hip Stretches  Childs pose center, 30 sec x3;  Supine pec stretch 30 sec x3;       Knee/Hip  Exercises: Aerobic   Stationary Bike  L 3 x 8 min;       Knee/Hip Exercises: Seated   Sit to Sand  15 reps      Knee/Hip Exercises: Supine   Other Supine Knee/Hip Exercises  Pelvic tilts x15             PT Education - 01/02/20 1116    Education Details  HEP updated    Person(s) Educated  Patient    Methods  Explanation;Handout;Demonstration;Tactile cues;Verbal cues    Comprehension  Verbalized understanding;Returned demonstration;Verbal cues required       PT Short Term Goals - 12/24/19 2057      PT SHORT TERM GOAL #1   Title  Pt to be independent wtih initial HEP    Time  2    Period  Weeks    Status  New    Target Date  01/07/20        PT Long Term Goals - 12/24/19 2057      PT LONG TERM GOAL #1  Title  Pt to be independent with final HEP for back and hips    Time  6    Period  Weeks    Status  New    Target Date  02/04/20      PT LONG TERM GOAL #2   Title  Pt to verbalize best exercises for improving hip flexion ROM and stiffness.    Time  6    Period  Weeks    Status  New    Target Date  02/04/20      PT LONG TERM GOAL #3   Title  Pt to demo proper seated, standing, posture, as well as optimal posture with bend/lift/squat, for decreased pain in back with functional activity    Time  6    Period  Weeks    Status  New    Target Date  02/04/20            Plan - 01/02/20 1201    Clinical Impression Statement  Pt education on posture, and postural exercises today, HEP updated to include. Education also done for hip hinge and correct mechanics with transfers and squat/bend. Ther ex reviewed for lumbar and hip ROM/stretching. Plan to progress as tolerated.    Examination-Activity Limitations  Bend;Sit;Squat    Examination-Participation Restrictions  Cleaning;Community Activity;Shop;Yard Work    Stability/Clinical Decision Making  Stable/Uncomplicated    Rehab Potential  Good    PT Frequency  1x / week    PT Duration  6 weeks    PT  Treatment/Interventions  ADLs/Self Care Home Management;Cryotherapy;Electrical Stimulation;Iontophoresis 4mg /ml Dexamethasone;Moist Heat;Ultrasound;Traction;Neuromuscular re-education;Therapeutic exercise;Therapeutic activities;Functional mobility training;Gait training;Stair training;Patient/family education;Manual techniques;Dry needling;Passive range of motion;Taping;Spinal Manipulations;Joint Manipulations    Consulted and Agree with Plan of Care  Patient       Patient will benefit from skilled therapeutic intervention in order to improve the following deficits and impairments:  Decreased range of motion, Decreased activity tolerance, Hypomobility, Impaired flexibility, Improper body mechanics, Decreased strength, Decreased mobility  Visit Diagnosis: Stiffness of hip joint, left  Chronic bilateral low back pain without sciatica     Problem List Patient Active Problem List   Diagnosis Date Noted  . Benign prostatic hyperplasia 05/22/2019  . Erectile dysfunction 05/22/2019  . Gout 01/08/2019  . Degenerative disc disease, lumbar 12/11/2018  . Nonallopathic lesion of sacral region 12/11/2018  . Nonallopathic lesion of lumbar region 12/11/2018  . Nonallopathic lesion of thoracic region 12/11/2018  . Dyslipidemia 08/20/2014  . Osteoarthritis 07/15/2009    Troy [name], PT, DPT 12:09 PM  01/02/20    [facility] [place], [place], [address] Phone: [phone]   Fax:  [phone]  Name: Troy [name] MRN: [id number] Date of Birth: Apr 08, 1951

## 2020-01-07 ENCOUNTER — Ambulatory Visit: Payer: Medicare Other

## 2020-01-09 ENCOUNTER — Encounter: Payer: Medicare PPO | Admitting: Physical Therapy

## 2020-01-14 ENCOUNTER — Other Ambulatory Visit: Payer: Self-pay

## 2020-01-14 ENCOUNTER — Encounter: Payer: Self-pay | Admitting: Physical Therapy

## 2020-01-14 ENCOUNTER — Ambulatory Visit: Payer: Medicare PPO | Admitting: Physical Therapy

## 2020-01-14 DIAGNOSIS — G8929 Other chronic pain: Secondary | ICD-10-CM

## 2020-01-14 DIAGNOSIS — M25652 Stiffness of left hip, not elsewhere classified: Secondary | ICD-10-CM | POA: Diagnosis not present

## 2020-01-14 DIAGNOSIS — M545 Low back pain: Secondary | ICD-10-CM | POA: Diagnosis not present

## 2020-01-14 NOTE — Therapy (Signed)
[facility] [address] [place], [place], [address] Phone: [phone]   Fax:  [phone]  Physical Therapy Treatment  Patient Details  Name: Troy [name] MRN: [id number] Date of Birth: 04-Sep-1951 Referring Provider (PT): Dimas Chyle   Encounter Date: 01/14/2020  PT End of Session - 01/14/20 1324    Visit Number  3    Number of Visits  12    Date for PT Re-Evaluation  02/04/20    Authorization Type  [employer] Medicare    PT Start Time  1217    PT Stop Time  1312    PT Time Calculation (min)  55 min    Activity Tolerance  Patient tolerated treatment well    Behavior During Therapy  [facility] for tasks assessed/performed       Past Medical History:  Diagnosis Date  . BPH (benign prostatic hyperplasia)     Past Surgical History:  Procedure Laterality Date  . COLONOSCOPY  11-24-2005   STARK-TICS ONLY  . EYE SURGERY Bilateral 2005   lasik  . JOINT REPLACEMENT Left 2007   Lt THR - Dr Ronnie Derby    There were no vitals filed for this visit.  Subjective Assessment - 01/14/20 1323    Subjective  Pt states doing well with HEP. Hip continues to be tight. L shoulder also tight. Pt states he has been trying to be aware of his posture.    Currently in Pain?  No/denies    Pain Score  0-No pain         [facility] PT Assessment - 01/14/20 1227      AROM   Left Hip Flexion  90                   [facility] Adult PT Treatment/Exercise - 01/14/20 1227      Posture/Postural Control   Posture Comments  poor seated posture, with ppt, decreased standing posture, with mild forward head and rounded shoulders       Exercises   Exercises  Knee/Hip;Neck      Neck Exercises: Theraband   Rows  Green;20 reps      Neck Exercises: Standing   Neck Retraction  10 reps    Other Standing Exercises  Posture at [place] x2 min, with chin tucks x15;       Neck Exercises: Seated   Shoulder Rolls  --    Other Seated Exercise  --      Neck Exercises:  Supine   Other Supine Exercise  Shoulder flex and 90/90 ER with cane x10 each       Knee/Hip Exercises: Stretches   Active Hamstring Stretch  2 reps;30 seconds    Active Hamstring Stretch Limitations  seated     Other Knee/Hip Stretches  SKTC 30 sec x 3 bil;     Other Knee/Hip Stretches  Childs pose center, 30 sec x3;  Cobra pose for lumbar extension 1 min x2;       Knee/Hip Exercises: Aerobic   Stationary Bike  L 3 x 8 min;       Knee/Hip Exercises: Seated   Sit to Sand  --      Knee/Hip Exercises: Supine   Bridges  20 reps    Other Supine Knee/Hip Exercises  Pelvic tilts x15    Other Supine Knee/Hip Exercises  Ab set x10, Bicycle x20;       Manual Therapy   Manual Therapy  Joint mobilization    Joint Mobilization  PA mobs for thoracic and lumbar spine:  Inf and lateral L hip mobs with strap       Neck Exercises: Stretches   Chest Stretch Limitations  Supine pec stretch 30 sec x3;                PT Short Term Goals - 12/24/19 2057      PT SHORT TERM GOAL #1   Title  Pt to be independent wtih initial HEP    Time  2    Period  Weeks    Status  New    Target Date  01/07/20        PT Long Term Goals - 12/24/19 2057      PT LONG TERM GOAL #1   Title  Pt to be independent with final HEP for back and hips    Time  6    Period  Weeks    Status  New    Target Date  02/04/20      PT LONG TERM GOAL #2   Title  Pt to verbalize best exercises for improving hip flexion ROM and stiffness.    Time  6    Period  Weeks    Status  New    Target Date  02/04/20      PT LONG TERM GOAL #3   Title  Pt to demo proper seated, standing, posture, as well as optimal posture with bend/lift/squat, for decreased pain in back with functional activity    Time  6    Period  Weeks    Status  New    Target Date  02/04/20            Plan - 01/14/20 1325    Clinical Impression Statement  Pt with stiffness in thoracic/lumbar spine, with noted difficulty with extension.  Discussed safe ROM for this today. Manual /joint mobilization done for back and hip ROM. L shoulder noted as being stiff, added flexion and ER for HEP for this as well. Pt with much diffiucty with lumbar stabilization today, education and practice for correct activation of TA, with movment. Pt to benefit from continued care, for mobilization, and progression of ther ex for HEP. Pt L hip ROM not improved from previous measurements.    Examination-Activity Limitations  Bend;Sit;Squat    Examination-Participation Restrictions  Cleaning;Community Activity;Shop;Yard Work    Stability/Clinical Decision Making  Stable/Uncomplicated    Rehab Potential  Good    PT Frequency  1x / week    PT Duration  6 weeks    PT Treatment/Interventions  ADLs/Self Care Home Management;Cryotherapy;Electrical Stimulation;Iontophoresis 4mg /ml Dexamethasone;Moist Heat;Ultrasound;Traction;Neuromuscular re-education;Therapeutic exercise;Therapeutic activities;Functional mobility training;Gait training;Stair training;Patient/family education;Manual techniques;Dry needling;Passive range of motion;Taping;Spinal Manipulations;Joint Manipulations    Consulted and Agree with Plan of Care  Patient       Patient will benefit from skilled therapeutic intervention in order to improve the following deficits and impairments:  Decreased range of motion, Decreased activity tolerance, Hypomobility, Impaired flexibility, Improper body mechanics, Decreased strength, Decreased mobility  Visit Diagnosis: Stiffness of hip joint, left  Chronic bilateral low back pain without sciatica     Problem List Patient Active Problem List   Diagnosis Date Noted  . Benign prostatic hyperplasia 05/22/2019  . Erectile dysfunction 05/22/2019  . Gout 01/08/2019  . Degenerative disc disease, lumbar 12/11/2018  . Nonallopathic lesion of sacral region 12/11/2018  . Nonallopathic lesion of lumbar region 12/11/2018  . Nonallopathic lesion of thoracic region  12/11/2018  . Dyslipidemia 08/20/2014  .  Osteoarthritis 07/15/2009    Lyndee Hensen, PT, DPT 1:27 PM  01/14/20   [facility] [place], [place], [address] Phone: [phone]   Fax:  [phone]  Name: Troy [name] MRN: [id number] Date of Birth: 1951-03-10

## 2020-01-21 ENCOUNTER — Ambulatory Visit: Payer: Medicare PPO | Admitting: Physical Therapy

## 2020-01-21 ENCOUNTER — Other Ambulatory Visit: Payer: Self-pay

## 2020-01-21 ENCOUNTER — Encounter: Payer: Self-pay | Admitting: Physical Therapy

## 2020-01-21 DIAGNOSIS — G8929 Other chronic pain: Secondary | ICD-10-CM | POA: Diagnosis not present

## 2020-01-21 DIAGNOSIS — M25652 Stiffness of left hip, not elsewhere classified: Secondary | ICD-10-CM | POA: Diagnosis not present

## 2020-01-21 DIAGNOSIS — M545 Low back pain: Secondary | ICD-10-CM

## 2020-01-21 NOTE — Therapy (Signed)
[facility] [address] [place], [place], [address] Phone: [phone]   Fax:  [phone]  Physical Therapy Treatment/Discharge   Patient Details  Name: Troy [name] MRN: [id number] Date of Birth: Jan 25, 1951 Referring Provider (PT): Dimas Chyle   Encounter Date: 01/21/2020  PT End of Session - 01/21/20 1358    Visit Number  4    Number of Visits  12    Date for PT Re-Evaluation  02/04/20    Authorization Type  [employer] Medicare    PT Start Time  1350    PT Stop Time  1430    PT Time Calculation (min)  40 min    Activity Tolerance  Patient tolerated treatment well    Behavior During Therapy  [facility] for tasks assessed/performed       Past Medical History:  Diagnosis Date  . BPH (benign prostatic hyperplasia)     Past Surgical History:  Procedure Laterality Date  . COLONOSCOPY  11-24-2005   STARK-TICS ONLY  . EYE SURGERY Bilateral 2005   lasik  . JOINT REPLACEMENT Left 2007   Lt THR - Dr Ronnie Derby    There were no vitals filed for this visit.  Subjective Assessment - 01/21/20 1358    Subjective  no new complaints. Doing well with HEP    Currently in Pain?  No/denies    Pain Score  0-No pain         [facility] PT Assessment - 01/21/20 1354      AROM   Left Hip Flexion  96                   [facility] Adult PT Treatment/Exercise - 01/21/20 1354      Posture/Postural Control   Posture Comments  --      Exercises   Exercises  Knee/Hip;Neck      Neck Exercises: Theraband   Rows  20 reps;Blue      Neck Exercises: Standing   Neck Retraction  10 reps    Other Standing Exercises  --      Neck Exercises: Supine   Other Supine Exercise  Shoulder flex and 90/90 ER with cane x10 each       Knee/Hip Exercises: Stretches   Active Hamstring Stretch  2 reps;30 seconds    Active Hamstring Stretch Limitations  seated     Hip Flexor Stretch  3 reps;30 seconds    Hip Flexor Stretch Limitations  off side of table     Other Knee/Hip Stretches  Lower trunk rotation x10;     Other Knee/Hip Stretches  Childs pose center, 30 sec x3;  Cobra pose for lumbar extension 1 min       Knee/Hip Exercises: Aerobic   Stationary Bike  L 3 x 8 min;       Knee/Hip Exercises: Supine   Bridges  --    Bridges with [employer]  20 reps    Other Supine Knee/Hip Exercises  Pelvic tilts x15    Other Supine Knee/Hip Exercises  Ab set x10, 90/90 heel taps x20;       Manual Therapy   Manual Therapy  Joint mobilization;Passive ROM    Joint Mobilization  PA mobs for thoracic and lumbar spine:  Inf and lateral L hip mobs with strap     Passive ROM  for L hip flexion       Neck Exercises: Stretches   Chest Stretch Limitations  --  PT Education - 01/21/20 2130    Education Details  Final HEP reviewed    Person(s) Educated  Patient    Methods  Explanation;Demonstration;Handout;Verbal cues    Comprehension  Verbalized understanding;Verbal cues required       PT Short Term Goals - 01/21/20 2130      PT SHORT TERM GOAL #1   Title  Pt to be independent wtih initial HEP    Time  2    Period  Weeks    Status  Achieved    Target Date  01/07/20        PT Long Term Goals - 01/21/20 2130      PT LONG TERM GOAL #1   Title  Pt to be independent with final HEP for back and hips    Time  6    Period  Weeks    Status  Achieved      PT LONG TERM GOAL #2   Title  Pt to verbalize best exercises for improving hip flexion ROM and stiffness.    Time  6    Period  Weeks    Status  Achieved      PT LONG TERM GOAL #3   Title  Pt to demo proper seated, standing, posture, as well as optimal posture with bend/lift/squat, for decreased pain in back with functional activity    Time  6    Period  Weeks    Status  Achieved            Plan - 01/21/20 2131    Clinical Impression Statement  Pt doing very well with exercising at home, and with HEP. He has improved with ability for ther ex, with focus on  mobility exercises for stiffness. Pt does have stiffness in lumbar spine and L hip, and will continue to focus on ther ex to improve. Pt with no limitations with functional activity at this time. Pt has met goals at this time, and is ready for d/c . Final HEP reviewed today, pt in agreement with plan.    Examination-Activity Limitations  Bend;Sit;Squat    Examination-Participation Restrictions  Cleaning;Community Activity;Shop;Yard Work    Stability/Clinical Decision Making  Stable/Uncomplicated    Rehab Potential  Good    PT Frequency  1x / week    PT Duration  6 weeks    PT Treatment/Interventions  ADLs/Self Care Home Management;Cryotherapy;Electrical Stimulation;Iontophoresis 57m/ml Dexamethasone;Moist Heat;Ultrasound;Traction;Neuromuscular re-education;Therapeutic exercise;Therapeutic activities;Functional mobility training;Gait training;Stair training;Patient/family education;Manual techniques;Dry needling;Passive range of motion;Taping;Spinal Manipulations;Joint Manipulations    Consulted and Agree with Plan of Care  Patient       Patient will benefit from skilled therapeutic intervention in order to improve the following deficits and impairments:  Decreased range of motion, Decreased activity tolerance, Hypomobility, Impaired flexibility, Improper body mechanics, Decreased strength, Decreased mobility  Visit Diagnosis: Stiffness of hip joint, left  Chronic bilateral low back pain without sciatica     Problem List Patient Active Problem List   Diagnosis Date Noted  . Benign prostatic hyperplasia 05/22/2019  . Erectile dysfunction 05/22/2019  . Gout 01/08/2019  . Degenerative disc disease, lumbar 12/11/2018  . Nonallopathic lesion of sacral region 12/11/2018  . Nonallopathic lesion of lumbar region 12/11/2018  . Nonallopathic lesion of thoracic region 12/11/2018  . Dyslipidemia 08/20/2014  . Osteoarthritis 07/15/2009    LLyndee Hensen PT, DPT 9:38 PM  01/21/20    [place] [address]: [phone]  Fax:  [phone]  Name: Troy [name] MRN: [id number] Date of Birth: 01-31-1951   PHYSICAL THERAPY DISCHARGE SUMMARY  Visits from Start of Care: 4 Plan: Patient agrees to discharge.  Patient goals were met. Patient is being discharged due to meeting the stated rehab goals.  ?????     Lyndee Hensen, PT, DPT 9:39 PM  01/21/20

## 2020-01-23 ENCOUNTER — Ambulatory Visit: Payer: Medicare PPO | Attending: Internal Medicine

## 2020-01-23 DIAGNOSIS — Z23 Encounter for immunization: Secondary | ICD-10-CM

## 2020-01-23 NOTE — Progress Notes (Signed)
   Covid-19 Vaccination Clinic  Name:  Troy [name]    MRN: [id number] DOB: May 11, 1951  01/23/2020  [name] was observed post Covid-19 immunization for 15 minutes without incident. He was provided with Vaccine Information Sheet and instruction to access the V-Safe system.   [name] was instructed to call 911 with any severe reactions post vaccine: Marland Kitchen Difficulty breathing  . Swelling of face and throat  . A fast heartbeat  . A bad rash all over body  . Dizziness and weakness   Immunizations Administered    Name Date Dose VIS Date Route   Pfizer COVID-19 Vaccine 01/23/2020 12:15 PM 0.3 mL 11/02/2019 Intramuscular   Manufacturer: [place]   Lot: [id number]   [place]: [id number]

## 2020-04-24 ENCOUNTER — Ambulatory Visit (INDEPENDENT_AMBULATORY_CARE_PROVIDER_SITE_OTHER): Payer: Medicare PPO | Admitting: Otolaryngology

## 2020-04-24 ENCOUNTER — Encounter (INDEPENDENT_AMBULATORY_CARE_PROVIDER_SITE_OTHER): Payer: Self-pay | Admitting: Otolaryngology

## 2020-04-24 ENCOUNTER — Other Ambulatory Visit: Payer: Self-pay

## 2020-04-24 VITALS — Temp 97.5°F

## 2020-04-24 DIAGNOSIS — H6121 Impacted cerumen, right ear: Secondary | ICD-10-CM

## 2020-04-24 NOTE — Progress Notes (Signed)
HPI: Troy [name] is a 69 y.o. male who presents for evaluation of decreased hearing in his right ear.  He was last seen about 2 years ago with wax buildup in his right ear.  He brings with him an audiogram today that demonstrated moderate hearing loss in the right ear compared to the left.  He is having no pain he tried washing the ears out with eardrops..  Past Medical History:  Diagnosis Date  . BPH (benign prostatic hyperplasia)    Past Surgical History:  Procedure Laterality Date  . COLONOSCOPY  11-24-2005   STARK-TICS ONLY  . EYE SURGERY Bilateral 2005   lasik  . JOINT REPLACEMENT Left 2007   Lt THR - Dr Ronnie Derby   Social History   Socioeconomic History  . Marital status: Married    Spouse name: Not on file  . Number of children: Not on file  . Years of education: Not on file  . Highest education level: Not on file  Occupational History  . Not on file  Tobacco Use  . Smoking status: Never Smoker  . Smokeless tobacco: Never Used  Substance and Sexual Activity  . Alcohol use: No    Alcohol/week: 0.0 standard drinks  . Drug use: No  . Sexual activity: Not on file  Other Topics Concern  . Not on file  Social History Narrative  . Not on file   Social Determinants of Health   Financial Resource Strain:   . Difficulty of Paying Living Expenses:   Food Insecurity:   . Worried About Charity fundraiser in the Last Year:   . Arboriculturist in the Last Year:   Transportation Needs:   . Film/video editor (Medical):   Marland Kitchen Lack of Transportation (Non-Medical):   Physical Activity:   . Days of Exercise per Week:   . Minutes of Exercise per Session:   Stress:   . Feeling of Stress :   Social Connections:   . Frequency of Communication with Friends and Family:   . Frequency of Social Gatherings with Friends and Family:   . Attends Religious Services:   . Active Member of Clubs or Organizations:   . Attends Archivist Meetings:   Marland Kitchen Marital Status:    Family  History  Problem Relation Age of Onset  . Heart disease Father   . Cancer Brother        Lung Cancer  . Colon cancer Neg Hx   . Colon polyps Neg Hx   . Esophageal cancer Neg Hx   . Rectal cancer Neg Hx   . Stomach cancer Neg Hx   . Prostate cancer Neg Hx    No Known Allergies Prior to Admission medications   Medication Sig Start Date End Date Taking? Authorizing Provider  finasteride (PROSCAR) 5 MG tablet Take 1 tablet Daily for Prostate 10/26/19  Yes Unk Pinto, MD  meloxicam (MOBIC) 15 MG tablet Take 1 tablet Daily with Food for Pain & Inflammation 10/26/19  Yes Unk Pinto, MD  Multiple Vitamin (MULTIVITAMIN) capsule Take by mouth.   Yes [provider]  sildenafil (REVATIO) 20 MG tablet Take 2 to 5 tablets Daily as needed for XXXX 10/26/19  Yes Unk Pinto, MD     Positive ROS: Otherwise negative  All other systems have been reviewed and were otherwise negative with the exception of those mentioned in the HPI and as above.  Physical Exam: Constitutional: Alert, well-appearing, no acute distress Ears: External ears  without lesions or tenderness. Ear canals left ear canal and left TM are clear.  Right ear canal is completely occluded with cerumen that was cleaned with hydroperoxide and suction.  The TM was clear..  After removing the wax his hearing was symmetric using the 512 1024 tuning fork bilaterally. Nasal: External nose without lesions. Clear nasal passages Oral: Oropharynx clear. Neck: No palpable adenopathy or masses Respiratory: Breathing comfortably  Skin: No facial/neck lesions or rash noted.  Cerumen impaction removal  Date/Time: 04/24/2020 2:01 PM Performed by: Rozetta Nunnery, MD Authorized by: Rozetta Nunnery, MD   Consent:    Consent obtained:  Verbal   Consent given by:  Patient   Risks discussed:  Pain and bleeding Procedure details:    Location:  R ear   Procedure type: curette and suction   Post-procedure details:     Inspection:  TM intact and canal normal   Hearing quality:  Improved   Patient tolerance of procedure:  Tolerated well, no immediate complications Comments:     TMs are clear bilaterally.    Assessment: Right cerumen impaction causing right hearing loss.  This was cleaned in the office.2  Plan: After cleaning the cerumen his hearing was much better.  Radene Journey, MD

## 2020-05-07 ENCOUNTER — Ambulatory Visit: Payer: Medicare PPO | Admitting: Family Medicine

## 2020-05-15 ENCOUNTER — Ambulatory Visit (INDEPENDENT_AMBULATORY_CARE_PROVIDER_SITE_OTHER): Payer: Medicare PPO

## 2020-05-15 ENCOUNTER — Other Ambulatory Visit: Payer: Self-pay

## 2020-05-15 ENCOUNTER — Encounter: Payer: Self-pay | Admitting: Family Medicine

## 2020-05-15 ENCOUNTER — Ambulatory Visit: Payer: Medicare PPO | Admitting: Family Medicine

## 2020-05-15 VITALS — BP 110/82 | HR 61 | Ht 70.0 in

## 2020-05-15 DIAGNOSIS — M19072 Primary osteoarthritis, left ankle and foot: Secondary | ICD-10-CM | POA: Diagnosis not present

## 2020-05-15 DIAGNOSIS — M109 Gout, unspecified: Secondary | ICD-10-CM

## 2020-05-15 DIAGNOSIS — M79672 Pain in left foot: Secondary | ICD-10-CM | POA: Diagnosis not present

## 2020-05-15 DIAGNOSIS — M542 Cervicalgia: Secondary | ICD-10-CM

## 2020-05-15 DIAGNOSIS — M5030 Other cervical disc degeneration, unspecified cervical region: Secondary | ICD-10-CM | POA: Diagnosis not present

## 2020-05-15 DIAGNOSIS — M50323 Other cervical disc degeneration at C6-C7 level: Secondary | ICD-10-CM | POA: Diagnosis not present

## 2020-05-15 DIAGNOSIS — M50322 Other cervical disc degeneration at C5-C6 level: Secondary | ICD-10-CM | POA: Diagnosis not present

## 2020-05-15 DIAGNOSIS — M50321 Other cervical disc degeneration at C4-C5 level: Secondary | ICD-10-CM | POA: Diagnosis not present

## 2020-05-15 MED ORDER — ALLOPURINOL 100 MG PO TABS: 100.0000 mg | ORAL_TABLET | Freq: Two times a day (BID) | ORAL | 0 refills | Status: DC

## 2020-05-15 NOTE — Assessment & Plan Note (Signed)
Severe overall.  Exercises for scapular strengthening given.  Discussed home exercises.  X-rays pending.  Worsening pain will consider the possibility of gabapentin.  Discussed proper lifting mechanics and keeping hands within peripheral vision follow-up again in 4 to 6 weeks

## 2020-05-15 NOTE — Progress Notes (Signed)
[place] Phone: [phone] Subjective:    I'm seeing this patient by the request  of:  Vivi Barrack, MD  I, Judy Pimple, am serving as a scribe for Dr. Hulan Saas.  CC: R great toe and second toe swelling  TLX:[id number]  Troy [name] is a 69 y.o. male coming in with complaint of R great toe pain.   Onset- 6 months Character- no pain just red  Reliving factors- does not hurt  Therapies tried- icy hot     Past Medical History:  Diagnosis Date  . BPH (benign prostatic hyperplasia)    Past Surgical History:  Procedure Laterality Date  . COLONOSCOPY  11-24-2005   STARK-TICS ONLY  . EYE SURGERY Bilateral 2005   lasik  . JOINT REPLACEMENT Left 2007   Lt THR - Dr Ronnie Derby   Social History   Socioeconomic History  . Marital status: Married    Spouse name: Not on file  . Number of children: Not on file  . Years of education: Not on file  . Highest education level: Not on file  Occupational History  . Not on file  Tobacco Use  . Smoking status: Never Smoker  . Smokeless tobacco: Never Used  Substance and Sexual Activity  . Alcohol use: No    Alcohol/week: 0.0 standard drinks  . Drug use: No  . Sexual activity: Not on file  Other Topics Concern  . Not on file  Social History Narrative  . Not on file   Social Determinants of Health   Financial Resource Strain:   . Difficulty of Paying Living Expenses:   Food Insecurity:   . Worried About Charity fundraiser in the Last Year:   . Arboriculturist in the Last Year:   Transportation Needs:   . Film/video editor (Medical):   Marland Kitchen Lack of Transportation (Non-Medical):   Physical Activity:   . Days of Exercise per Week:   . Minutes of Exercise per Session:   Stress:   . Feeling of Stress :   Social Connections:   . Frequency of Communication with Friends and Family:   . Frequency of Social Gatherings with Friends and Family:   .  Attends Religious Services:   . Active Member of Clubs or Organizations:   . Attends Archivist Meetings:   Marland Kitchen Marital Status:    No Known Allergies Family History  Problem Relation Age of Onset  . Heart disease Father   . Cancer Brother        Lung Cancer  . Colon cancer Neg Hx   . Colon polyps Neg Hx   . Esophageal cancer Neg Hx   . Rectal cancer Neg Hx   . Stomach cancer Neg Hx   . Prostate cancer Neg Hx      Current Outpatient Medications (Cardiovascular):  .  sildenafil (REVATIO) 20 MG tablet, Take 2 to 5 tablets Daily as needed for XXXX   Current Outpatient Medications (Analgesics):  .  allopurinol (ZYLOPRIM) 100 MG tablet, Take 1 tablet (100 mg total) by mouth 2 (two) times daily. .  meloxicam (MOBIC) 15 MG tablet, Take 1 tablet Daily with Food for Pain & Inflammation   Current Outpatient Medications (Other):  .  finasteride (PROSCAR) 5 MG tablet, Take 1 tablet Daily for Prostate .  Multiple Vitamin (MULTIVITAMIN) capsule, Take by mouth.   Reviewed prior external information including notes and imaging  from  primary care provider As well as notes that were available from care everywhere and other healthcare systems.  Past medical history, social, surgical and family history all reviewed in electronic medical record.  No pertanent information unless stated regarding to the chief complaint.   Review of Systems:  No headache, visual changes, nausea, vomiting, diarrhea, constipation, dizziness, abdominal pain, skin rash, fevers, chills, night sweats, weight loss, swollen lymph nodes, body aches, joint swelling, chest pain, shortness of breath, mood changes. POSITIVE muscle aches  Objective  Blood pressure 110/82, pulse 61, height 5\' 10"  (1.778 m), SpO2 96 %.   General: No apparent distress alert and oriented x3 mood and affect normal, dressed appropriately.  HEENT: Pupils equal, extraocular movements intact  Respiratory: Patient's speak in full sentences  and does not appear short of breath  Gait antalgic MSK: Patient's knee exam bilaterally shows the patient does have significant arthritic changes of the toes noted.  Fungal infection noted in the toes severely on the right first toe with significant reabsorption of the nail at this time.  Patient has what appears to be more tophi formation as well. Patient's left foot second toe does have significant erythema noted potentially is a gout flare.  Patient is minimally tender to palpation.  Significant breakdown of the transverse arch noted.   Neck exam does show that patient does have some loss of lordosis.  Patient has only about 5 degrees of extension.  Minimal sidebending bilaterally as well.  Negative Spurling's though.  Mild weakness noted in the parascapular region.      Impression and Recommendations:     The above documentation has been reviewed and is accurate and complete Lyndal Pulley, DO       Note: This dictation was prepared with Dragon dictation along with smaller phrase technology. Any transcriptional errors that result from this process are unintentional.

## 2020-05-15 NOTE — Patient Instructions (Addendum)
Good to see you.  Get x-ray's on your way out  Start allopurinol Two tablets by mouth daily  Scapular exercises given   See me again in 6-8 weeks

## 2020-05-15 NOTE — Assessment & Plan Note (Signed)
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  We discussed with patient in great length about the idea of treating with allopurinol.  I believe that patient could do relatively well.  No history of kidney compromise but we will start at 200 mg.  Patient's primary care provider can adjust if necessary and accordingly.  Discussed different shoes.  Due to patient's toenails would need to consider the possibility of removal.  Follow-up again 4 to 8 weeks.

## 2020-05-18 ENCOUNTER — Encounter: Payer: Self-pay | Admitting: Family Medicine

## 2020-07-02 ENCOUNTER — Encounter: Payer: Self-pay | Admitting: Family Medicine

## 2020-07-02 ENCOUNTER — Ambulatory Visit: Payer: Medicare PPO | Admitting: Family Medicine

## 2020-07-02 ENCOUNTER — Other Ambulatory Visit: Payer: Self-pay

## 2020-07-02 VITALS — BP 110/70 | HR 66 | Ht 70.0 in | Wt 204.0 lb

## 2020-07-02 DIAGNOSIS — M109 Gout, unspecified: Secondary | ICD-10-CM | POA: Diagnosis not present

## 2020-07-02 DIAGNOSIS — M5136 Other intervertebral disc degeneration, lumbar region: Secondary | ICD-10-CM

## 2020-07-02 DIAGNOSIS — M999 Biomechanical lesion, unspecified: Secondary | ICD-10-CM

## 2020-07-02 MED ORDER — ALLOPURINOL 100 MG PO TABS: 100.0000 mg | ORAL_TABLET | Freq: Two times a day (BID) | ORAL | 0 refills | Status: DC

## 2020-07-02 NOTE — Patient Instructions (Signed)
Good to see you  Ice 20 minutes 2 times daily. Usually after activity and before bed. Refilled medicine  See me agai nin 8-12 weeks for another check in if you need me

## 2020-07-02 NOTE — Assessment & Plan Note (Signed)
TTP tried mild manipulation, discussed HEP discussed posture and ergonomics rtc in 8 weeks

## 2020-07-02 NOTE — Progress Notes (Signed)
[place] Phone: [phone] Subjective:   Fontaine No, am serving as a scribe for Dr. Hulan Saas. This visit occurred during the SARS-CoV-2 public health emergency.  Safety protocols were in place, including screening questions prior to the visit, additional usage of staff PPE, and extensive cleaning of exam room while observing appropriate contact time as indicated for disinfecting solutions.   I'm seeing this patient by the request  of:  Vivi Barrack, MD  CC: low back pain   [id number]   05/15/2020 Patient does have signs and symptoms consistent with morbid gout.  We discussed with patient in great length about the idea of treating with allopurinol.  I believe that patient could do relatively well.  No history of kidney compromise but we will start at 200 mg.  Patient's primary care provider can adjust if necessary and accordingly.  Discussed different shoes.  Due to patient's toenails would need to consider the possibility of removal.  Follow-up again 4 to 8 weeks.  Severe overall.  Exercises for scapular strengthening given.  Discussed home exercises.  X-rays pending.  Worsening pain will consider the possibility of gabapentin.  Discussed proper lifting mechanics and keeping hands within peripheral vision follow-up again in 4 to 6 weeks  Update 07/02/2020 [name] MUNDT is a 69 y.o. male coming in with complaint of neck pain and gout. Patient states that his pain has improved in neck. Has been exercising without pain. Swelling in toe has subsided. Would like results from xrays taken last visit.  Cervical  Extensive multilevel spondylosis and facet hypertrophy.  Dg Foot  IMPRESSION: 1. Marked joint space narrowing and osteophyte formation at the first tarsometatarsal joint and metatarsophalangeal joints, with areas of overlying soft tissue calcification. Given the lack of erosive change, I would favor  osteoarthritis over other arthropathy such as gout. 2. 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 3. No acute fracture.    Past Medical History:  Diagnosis Date  . BPH (benign prostatic hyperplasia)    Past Surgical History:  Procedure Laterality Date  . COLONOSCOPY  11-24-2005   STARK-TICS ONLY  . EYE SURGERY Bilateral 2005   lasik  . JOINT REPLACEMENT Left 2007   Lt THR - Dr Ronnie Derby   Social History   Socioeconomic History  . Marital status: Married    Spouse name: Not on file  . Number of children: Not on file  . Years of education: Not on file  . Highest education level: Not on file  Occupational History  . Not on file  Tobacco Use  . Smoking status: Never Smoker  . Smokeless tobacco: Never Used  Substance and Sexual Activity  . Alcohol use: No    Alcohol/week: 0.0 standard drinks  . Drug use: No  . Sexual activity: Not on file  Other Topics Concern  . Not on file  Social History Narrative  . Not on file   Social Determinants of Health   Financial Resource Strain:   . Difficulty of Paying Living Expenses:   Food Insecurity:   . Worried About Charity fundraiser in the Last Year:   . Arboriculturist in the Last Year:   Transportation Needs:   . Film/video editor (Medical):   Marland Kitchen Lack of Transportation (Non-Medical):   Physical Activity:   . Days of Exercise per Week:   . Minutes of Exercise per Session:   Stress:   .  Feeling of Stress :   Social Connections:   . Frequency of Communication with Friends and Family:   . Frequency of Social Gatherings with Friends and Family:   . Attends Religious Services:   . Active Member of Clubs or Organizations:   . Attends Archivist Meetings:   Marland Kitchen Marital Status:    No Known Allergies Family History  Problem Relation Age of Onset  . Heart disease Father   . Cancer Brother        Lung Cancer  . Colon cancer Neg Hx   . Colon polyps Neg Hx   . Esophageal cancer Neg Hx   . Rectal  cancer Neg Hx   . Stomach cancer Neg Hx   . Prostate cancer Neg Hx      Current Outpatient Medications (Cardiovascular):  .  sildenafil (REVATIO) 20 MG tablet, Take 2 to 5 tablets Daily as needed for XXXX   Current Outpatient Medications (Analgesics):  .  meloxicam (MOBIC) 15 MG tablet, Take 1 tablet Daily with Food for Pain & Inflammation .  allopurinol (ZYLOPRIM) 100 MG tablet, Take 1 tablet (100 mg total) by mouth 2 (two) times daily.   Current Outpatient Medications (Other):  .  finasteride (PROSCAR) 5 MG tablet, Take 1 tablet Daily for Prostate .  Multiple Vitamin (MULTIVITAMIN) capsule, Take by mouth.   Reviewed prior external information including notes and imaging from  primary care provider As well as notes that were available from care everywhere and other healthcare systems.  Past medical history, social, surgical and family history all reviewed in electronic medical record.  No pertanent information unless stated regarding to the chief complaint.   Review of Systems:  No headache, visual changes, nausea, vomiting, diarrhea, constipation, dizziness, abdominal pain, skin rash, fevers, chills, night sweats, weight loss, swollen lymph nodes, body aches, joint swelling, chest pain, shortness of breath, mood changes. POSITIVE muscle aches  Objective  Blood pressure 110/70, pulse 66, height 5\' 10"  (1.778 m), weight 204 lb (92.5 kg), SpO2 96 %.   General: No apparent distress alert and oriented x3 mood and affect normal, dressed appropriately.  HEENT: Pupils equal, extraocular movements intact  Respiratory: Patient's speak in full sentences and does not appear short of breath  Cardiovascular: No lower extremity edema, non tender, no erythema  Neuro: Cranial nerves II through XII are intact, neurovascularly intact in all extremities with 2+ DTRs and 2+ pulses.  Gait normal with good balance and coordination.  MSK:  Significant arthritic changes noted.  TTP in TL area, loss  or ROM mild positive faber and tightness of SLT.    Osteopathic findings   T5 extended rotated and side bent left L2 flexed rotated and side bent right Sacrum right on right    Impression and Recommendations:     The above documentation has been reviewed and is accurate and complete Lyndal Pulley, DO       Note: This dictation was prepared with Dragon dictation along with smaller phrase technology. Any transcriptional errors that result from this process are unintentional.

## 2020-07-02 NOTE — Assessment & Plan Note (Signed)
Refilled allopurinol xrays show erosion.

## 2020-08-11 ENCOUNTER — Other Ambulatory Visit: Payer: Self-pay

## 2020-08-11 ENCOUNTER — Encounter: Payer: Self-pay | Admitting: Family Medicine

## 2020-08-11 ENCOUNTER — Ambulatory Visit: Payer: Self-pay

## 2020-08-11 ENCOUNTER — Ambulatory Visit: Payer: Medicare PPO | Admitting: Family Medicine

## 2020-08-11 VITALS — BP 120/80 | HR 68 | Ht 70.0 in | Wt 201.0 lb

## 2020-08-11 DIAGNOSIS — M25532 Pain in left wrist: Secondary | ICD-10-CM

## 2020-08-11 DIAGNOSIS — M1A0721 Idiopathic chronic gout, left ankle and foot, with tophus (tophi): Secondary | ICD-10-CM | POA: Diagnosis not present

## 2020-08-11 MED ORDER — COLCHICINE 0.6 MG PO CAPS: 1.0000 | ORAL_CAPSULE | Freq: Every day | ORAL | 2 refills | Status: DC | PRN

## 2020-08-11 NOTE — Progress Notes (Signed)
Rito Ehrlich, am serving as a Education administrator for Dr. Lynne Leader.  Troy [name] is a 69 y.o. male who presents to [place] [facility] today for L wrist pain.  He was last seen by Dr. Tamala Julian on 07/02/20 for neck pain and gout f/u.  Since his last visit, pt reports His wrist has been swelling and painful for about a week. States that pain started on the underside of his wrist, but today is more tender on middle top wrist.  He notes this has improved a bit on its own with topical CBD cream and Tylenol and Advil.  He does have a history of elevated uric acid most recently 8.6 December.  He takes allopurinol 100 mg twice daily ongoing for few months now.  He has not had a recheck since starting allopurinol.  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  L wrist swelling: yes L wrist mechanical symptoms: no  Aggravating factors: none Treatments tried: Advil; tylenol; icy hot; ice; hemp cream   Pertinent review of systems: No fevers or chills  Relevant historical information: BPH   Exam:  BP 120/80 (BP Location: Left Arm, Patient Position: Sitting, Cuff Size: Normal)   Pulse 68   Ht 5\' 10"  (1.778 m)   Wt 201 lb (91.2 kg)   SpO2 97%   BMI 28.84 kg/m  General: Well Developed, well nourished, and in no acute distress.   MSK: Left wrist mild effusion otherwise normal-appearing Normal motion. Nontender. Intact strength.  Left second toe.  Significantly swollen at PIP.  No erythema not particularly tender.  Decreased motion.    Lab and Radiology Results  Diagnostic Limited MSK Ultrasound of: Left wrist Small effusion dorsal radiocarpal wrist joint.  Dorsal and volar tendon structures normal-appearing without significant tenosynovitis.  No obvious bony abnormality. Impression: Wrist effusion  X-ray images left foot obtained May 15, 2020 personally and independently interpreted today. 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  No significant osteoarthritic changes at PIP joint  Lab Results  Component Value Date   LABURIC 8.6 (H) 11/05/2019      Assessment and Plan: 69 y.o. male with left wrist pain and effusion improving spontaneously.  Occurs in the setting of significantly elevated uric acid.  This is very concerning for gout.  Plan to recheck uric acid to adjust allopurinol dose.  Also will prescribe colchicine for use as needed for future episodes.  Given his his pain is significantly improving no need for injection at this moment however could do that in the future.  Gout is a chronic issue with exacerbation today.  Left second toe swelling at PIP.  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  Again allopurinol and uric acid level check.  We will go ahead and check CMP as well as this may impact dosage of allopurinol or colchicine.   PDMP not reviewed this encounter. Orders Placed This Encounter  Procedures  . [place] LIMITED JOINT SPACE STRUCTURES UP LEFT(NO LINKED CHARGES)    Standing Status:   Future    Number of Occurrences:   1    Standing Expiration Date:   08/11/2021    Order Specific Question:   Reason for Exam (SYMPTOM  OR DIAGNOSIS REQUIRED)    Answer:   Left wrist pain    Order Specific Question:   Preferred imaging location?  Answer:   [facility]  . Uric acid    Standing Status:   Future    Number of Occurrences:   1    Standing Expiration Date:   08/11/2021  . COMPLETE METABOLIC PANEL WITH GFR    Standing Status:   Future    Number of Occurrences:   1    Standing Expiration Date:   08/11/2021   Meds ordered this encounter  Medications  . Colchicine (MITIGARE) 0.6 MG CAPS    Sig: Take 1 capsule by mouth daily as needed (gout pain).    Dispense:  30 capsule    Refill:  2     Discussed warning signs or symptoms. Please see discharge instructions. Patient  expresses understanding.   The above documentation has been reviewed and is accurate and complete Lynne Leader, M.D.

## 2020-08-11 NOTE — Patient Instructions (Signed)
Thank you for coming in today.  Please get labs today before you leave  Take colchicine daily for pain during a gout flair as needed.   Continue allopurinol to reduce uric acid and reduce gout risk.   Recheck as needed.

## 2020-08-12 LAB — COMPLETE METABOLIC PANEL WITH GFR
AG Ratio: 2.1 (calc) (ref 1.0–2.5)
ALT: 21 U/L (ref 9–46)
AST: 19 U/L (ref 10–35)
Albumin: 4.8 g/dL (ref 3.6–5.1)
Alkaline phosphatase (APISO): 63 U/L (ref 35–144)
BUN: 12 mg/dL (ref 7–25)
CO2: 22 mmol/L (ref 20–32)
Calcium: 9.9 mg/dL (ref 8.6–10.3)
Chloride: 103 mmol/L (ref 98–110)
Creat: 1.03 mg/dL (ref 0.70–1.25)
GFR, Est African American: 85 mL/min/{1.73_m2} (ref >=60)
GFR, Est Non African American: 74 mL/min/{1.73_m2} (ref >=60)
Globulin: 2.3 g/dL (calc) (ref 1.9–3.7)
Glucose, Bld: 77 mg/dL (ref 65–99)
Potassium: 4.8 mmol/L (ref 3.5–5.3)
Sodium: 136 mmol/L (ref 135–146)
Total Bilirubin: 0.4 mg/dL (ref 0.2–1.2)
Total Protein: 7.1 g/dL (ref 6.1–8.1)

## 2020-08-12 LAB — URIC ACID: Uric Acid, Serum: 7.2 mg/dL (ref 4.0–8.0)

## 2020-08-12 MED ORDER — ALLOPURINOL 300 MG PO TABS: 300.0000 mg | ORAL_TABLET | Freq: Every day | ORAL | 3 refills | Status: DC

## 2020-08-12 NOTE — Progress Notes (Signed)
Uric acid level is 7.2.  This is reduced from 9 months ago but still higher than we would like.  Currently you are taking 100 mg twice daily for a total dose of 200 mg/day.  I have sent a new prescription for 300 mg pills.  Take 1 of these daily.  This should get uric acid level less than 6.  Kidney function looks just fine.

## 2020-08-12 NOTE — Addendum Note (Signed)
Addended by: Gregor Hams on: 08/12/2020 07:45 AM   Modules accepted: Orders

## 2020-08-13 ENCOUNTER — Encounter: Payer: Self-pay | Admitting: Family Medicine

## 2020-08-27 ENCOUNTER — Other Ambulatory Visit: Payer: Self-pay

## 2020-08-27 ENCOUNTER — Ambulatory Visit: Payer: Medicare PPO | Admitting: Family Medicine

## 2020-08-27 DIAGNOSIS — D1801 Hemangioma of skin and subcutaneous tissue: Secondary | ICD-10-CM | POA: Diagnosis not present

## 2020-08-27 DIAGNOSIS — L821 Other seborrheic keratosis: Secondary | ICD-10-CM | POA: Diagnosis not present

## 2020-08-27 DIAGNOSIS — D239 Other benign neoplasm of skin, unspecified: Secondary | ICD-10-CM | POA: Diagnosis not present

## 2020-08-28 ENCOUNTER — Ambulatory Visit: Payer: Medicare PPO | Admitting: Family Medicine

## 2020-08-28 VITALS — BP 132/73 | HR 61 | Temp 97.9°F | Ht 70.0 in | Wt 199.4 lb

## 2020-08-28 DIAGNOSIS — H00014 Hordeolum externum left upper eyelid: Secondary | ICD-10-CM

## 2020-08-28 MED ORDER — ERYTHROMYCIN 5 MG/GM OP OINT: 1.0000 "application " | TOPICAL_OINTMENT | Freq: Every day | OPHTHALMIC | 0 refills | Status: DC

## 2020-08-28 NOTE — Progress Notes (Signed)
   Troy [name] is a 69 y.o. male who presents today for an office visit.  Assessment/Plan:  Hordeolum No red flags.  May have small amount of blepharitis as well.  Start topical erythromycin.  Start warm compresses.  Discussed natural course of illness.  Anticipate full recovery in the next 1 to 2 weeks.  Discussed reasons return to care.    Subjective:  HPI:  Patient here with erythema and swelling to left upper eyelid.  Started a few days ago.  No pain.  No itching.  No specific treatments tried.  No obvious precipitating events.  No fevers or chills.  No vision changes.      Objective:  Physical Exam: Temp 97.9 F (36.6 C) (Temporal)   Ht 5\' 10"  (1.778 m)   Wt 199 lb 6.4 oz (90.4 kg)   BMI 28.61 kg/m   Gen: No acute distress, resting comfortably HEENT: Left upper eyelid with hordeolum.  Extraocular eye movements intact.  No surrounding erythema. CV: Regular rate and rhythm with no murmurs appreciated Pulm: Normal work of breathing, clear to auscultation bilaterally with no crackles, wheezes, or rhonchi Neuro: Grossly normal, moves all extremities Psych: Normal affect and thought content      Troy [name] M. Jerline Pain, MD 08/28/2020 8:51 AM

## 2020-08-28 NOTE — Patient Instructions (Signed)
It was very nice to see you today!  You have a stye.  Use the ointment.  Use warm compresses.  Let me know if not improving in the next 1 to 2 weeks.  Take care, Dr Jerline Pain  Please try these tips to maintain a healthy lifestyle:   Eat at least 3 REAL meals and 1-2 snacks per day.  Aim for no more than 5 hours between eating.  If you eat breakfast, please do so within one hour of getting up.    Each meal should contain half fruits/vegetables, one quarter protein, and one quarter carbs (no bigger than a computer mouse)   Cut down on sweet beverages. This includes juice, soda, and sweet tea.     Drink at least 1 glass of water with each meal and aim for at least 8 glasses per day   Exercise at least 150 minutes every week.    Stye  A stye, also known as a hordeolum, is a bump that forms on an eyelid. It may look like a pimple next to the eyelash. A stye can form inside the eyelid (internal stye) or outside the eyelid (external stye). A stye can cause redness, swelling, and pain on the eyelid. Styes are very common. Anyone can get them at any age. They usually occur in just one eye, but you may have more than one in either eye. What are the causes? A stye is caused by an infection. The infection is almost always caused by bacteria called Staphylococcus aureus. This is a common type of bacteria that lives on the skin. An internal stye may result from an infected oil-producing gland inside the eyelid. An external stye may be caused by an infection at the base of the eyelash (hair follicle). What increases the risk? You are more likely to develop a stye if:  You have had a stye before.  You have any of these conditions: ? Diabetes. ? Red, itchy, inflamed eyelids (blepharitis). ? A skin condition such as seborrheic dermatitis or rosacea. ? High fat levels in your blood (lipids). What are the signs or symptoms? The most common symptom of a stye is eyelid pain. Internal styes are  more painful than external styes. Other symptoms may include:  Painful swelling of your eyelid.  A scratchy feeling in your eye.  Tearing and redness of your eye.  Pus draining from the stye. How is this diagnosed? Your health care provider may be able to diagnose a stye just by examining your eye. The health care provider may also check to make sure:  You do not have a fever or other signs of a more serious infection.  The infection has not spread to other parts of your eye or areas around your eye. How is this treated? Most styes will clear up in a few days without treatment or with warm compresses applied to the area. You may need to use antibiotic drops or ointment to treat an infection. In some cases, if your stye does not heal with routine treatment, your health care provider may drain pus from the stye using a thin blade or needle. This may be done if the stye is large, causing a lot of pain, or affecting your vision. Follow these instructions at home:  Take over-the-counter and prescription medicines only as told by your health care provider. This includes eye drops or ointments.  If you were prescribed an antibiotic medicine, apply or use it as told by your health care provider. Do  not stop using the antibiotic even if your condition improves.  Apply a warm, wet cloth (warm compress) to your eye for 5-10 minutes, 4 times a day.  Clean the affected eyelid as directed by your health care provider.  Do not wear contact lenses or eye makeup until your stye has healed.  Do not try to pop or drain the stye.  Do not rub your eye. Contact a health care provider if:  You have chills or a fever.  Your stye does not go away after several days.  Your stye affects your vision.  Your eyeball becomes swollen, red, or painful. Get help right away if:  You have pain when moving your eye around. Summary  A stye is a bump that forms on an eyelid. It may look like a pimple next to  the eyelash.  A stye can form inside the eyelid (internal stye) or outside the eyelid (external stye). A stye can cause redness, swelling, and pain on the eyelid.  Your health care provider may be able to diagnose a stye just by examining your eye.  Apply a warm, wet cloth (warm compress) to your eye for 5-10 minutes, 4 times a day. This information is not intended to replace advice given to you by your health care provider. Make sure you discuss any questions you have with your health care provider. Document Revised: 10/21/2017 Document Reviewed: 07/21/2017 Elsevier Patient Education  2020 [employer].

## 2020-08-31 DIAGNOSIS — D1801 Hemangioma of skin and subcutaneous tissue: Secondary | ICD-10-CM | POA: Diagnosis not present

## 2020-08-31 DIAGNOSIS — D239 Other benign neoplasm of skin, unspecified: Secondary | ICD-10-CM | POA: Diagnosis not present

## 2020-08-31 DIAGNOSIS — L821 Other seborrheic keratosis: Secondary | ICD-10-CM | POA: Diagnosis not present

## 2020-11-12 ENCOUNTER — Other Ambulatory Visit: Payer: Self-pay

## 2020-11-12 ENCOUNTER — Ambulatory Visit (INDEPENDENT_AMBULATORY_CARE_PROVIDER_SITE_OTHER): Payer: Medicare PPO | Admitting: Family Medicine

## 2020-11-12 ENCOUNTER — Encounter: Payer: Self-pay | Admitting: Family Medicine

## 2020-11-12 VITALS — BP 146/82 | HR 69 | Temp 98.5°F | Ht 70.0 in | Wt 200.4 lb

## 2020-11-12 DIAGNOSIS — E785 Hyperlipidemia, unspecified: Secondary | ICD-10-CM

## 2020-11-12 DIAGNOSIS — Z125 Encounter for screening for malignant neoplasm of prostate: Secondary | ICD-10-CM | POA: Diagnosis not present

## 2020-11-12 DIAGNOSIS — R739 Hyperglycemia, unspecified: Secondary | ICD-10-CM

## 2020-11-12 DIAGNOSIS — Z6828 Body mass index (BMI) 28.0-28.9, adult: Secondary | ICD-10-CM

## 2020-11-12 DIAGNOSIS — Z0000 Encounter for general adult medical examination without abnormal findings: Secondary | ICD-10-CM

## 2020-11-12 DIAGNOSIS — M109 Gout, unspecified: Secondary | ICD-10-CM | POA: Diagnosis not present

## 2020-11-12 DIAGNOSIS — N400 Enlarged prostate without lower urinary tract symptoms: Secondary | ICD-10-CM

## 2020-11-12 DIAGNOSIS — N529 Male erectile dysfunction, unspecified: Secondary | ICD-10-CM

## 2020-11-12 DIAGNOSIS — Z0001 Encounter for general adult medical examination with abnormal findings: Secondary | ICD-10-CM | POA: Diagnosis not present

## 2020-11-12 NOTE — Assessment & Plan Note (Signed)
Stable.  Continue finasteride 5 mg daily. 

## 2020-11-12 NOTE — Assessment & Plan Note (Signed)
Stable. Continue sildenafil as needed. 

## 2020-11-12 NOTE — Assessment & Plan Note (Signed)
Check uric acid level.  Continue allopurinol 300 g daily.

## 2020-11-12 NOTE — Assessment & Plan Note (Signed)
Check lipids.  Continue lifestyle modifications. 

## 2020-11-12 NOTE — Progress Notes (Signed)
Chief Complaint:  Troy [name] is a 69 y.o. male who presents today for his annual comprehensive physical exam and subsequent medicare annual wellness visit.    Assessment/Plan:  Chronic Problems Addressed Today: Dyslipidemia Check lipids.  Continue lifestyle modifications.  Erectile dysfunction Stable.  Continue sildenafil as needed.  Benign prostatic hyperplasia Stable.  Continue finasteride 5 mg daily.  Gout Check uric acid level.  Continue allopurinol 300 g daily.  Body mass index is 28.75 kg/m. / Overweight  BMI Metric Follow Up - 11/12/20 1136      BMI Metric Follow Up-Please document annually   BMI Metric Follow Up Education provided           Preventative Healthcare: Check CBC, CMET, TSH, PSA, lipid panel.  Due for colon cancer screening next year.  Up-to-date on other vaccines and screenings.  Patient Counseling(The following topics were reviewed and/or handout was given):  -Nutrition: Stressed importance of moderation in sodium/caffeine intake, saturated fat and cholesterol, caloric balance, sufficient intake of fresh fruits, vegetables, and fiber.  -Stressed the importance of regular exercise.   -Substance Abuse: Discussed cessation/primary prevention of tobacco, alcohol, or other drug use; driving or other dangerous activities under the influence; availability of treatment for abuse.   -Injury prevention: Discussed safety belts, safety helmets, smoke detector, smoking near bedding or upholstery.   -Sexuality: Discussed sexually transmitted diseases, partner selection, use of condoms, avoidance of unintended pregnancy and contraceptive alternatives.   -Dental health: Discussed importance of regular tooth brushing, flossing, and dental visits.  -Health maintenance and immunizations reviewed. Please refer to Health maintenance section.  During the course of the visit the patient was educated and counseled about appropriate screening and preventive services  including:        Fall prevention   Nutrition Physical Activity Weight Management Cognition  Return to care in 1 year for next preventative visit.     Subjective:  HPI:  Health Risk Assessment: Patient considers his overall health to be good. He has no difficulty performing the following:  Preparing food and eating  Bathing   Getting dressed  Using the toilet  Shopping  Managing Finances  Moving around from place to place  He has not had any falls within the past year.   Depression screen PHQ 2/9 11/12/2020  Decreased Interest 0  Down, Depressed, Hopeless 0  PHQ - 2 Score 0   Lifestyle Factors: Diet: Balanced. Plenty of fruits and vegetables. Exercise: Plays golf.   Patient Care Team: Vivi Barrack, MD as PCP - General (Family Medicine)   ROS: Per HPI, otherwise a complete review of systems was negative.   PMH:  The following were reviewed and entered/updated in epic: Past Medical History:  Diagnosis Date   BPH (benign prostatic hyperplasia)    Patient Active Problem List   Diagnosis Date Noted   Degenerative disc disease, cervical 05/15/2020   Benign prostatic hyperplasia 05/22/2019   Erectile dysfunction 05/22/2019   Gout 01/08/2019   Degenerative disc disease, lumbar 12/11/2018   Nonallopathic lesion of sacral region 12/11/2018   Nonallopathic lesion of lumbar region 12/11/2018   Nonallopathic lesion of thoracic region 12/11/2018   Dyslipidemia 08/20/2014   Osteoarthritis 07/15/2009   Past Surgical History:  Procedure Laterality Date   COLONOSCOPY  11-24-2005   STARK-TICS ONLY   EYE SURGERY Bilateral 2005   lasik   JOINT REPLACEMENT Left 2007   Lt THR - Dr Ronnie Derby    Family History  Problem Relation Age of Onset  Heart disease Father    Cancer Brother        Lung Cancer   Colon cancer Neg Hx    Colon polyps Neg Hx    Esophageal cancer Neg Hx    Rectal cancer Neg Hx    Stomach cancer Neg Hx    Prostate  cancer Neg Hx     Medications- reviewed and updated Current Outpatient Medications  Medication Sig Dispense Refill   allopurinol (ZYLOPRIM) 300 MG tablet Take 1 tablet (300 mg total) by mouth daily. 90 tablet 3   Colchicine (MITIGARE) 0.6 MG CAPS Take 1 capsule by mouth daily as needed (gout pain). 30 capsule 2   erythromycin ophthalmic ointment Place 1 application into the left eye at bedtime. 3.5 g 0   finasteride (PROSCAR) 5 MG tablet Take 1 tablet Daily for Prostate 90 tablet 3   meloxicam (MOBIC) 15 MG tablet Take 1 tablet Daily with Food for Pain & Inflammation 90 tablet 3   Multiple Vitamin (MULTIVITAMIN) capsule Take by mouth.     sildenafil (REVATIO) 20 MG tablet Take 2 to 5 tablets Daily as needed for XXXX 90 tablet 9   No current facility-administered medications for this visit.    Allergies-reviewed and updated No Known Allergies  Social History   Socioeconomic History   Marital status: Married    Spouse name: Not on file   Number of children: Not on file   Years of education: Not on file   Highest education level: Not on file  Occupational History   Not on file  Tobacco Use   Smoking status: Never Smoker   Smokeless tobacco: Never Used  Substance and Sexual Activity   Alcohol use: No    Alcohol/week: 0.0 standard drinks   Drug use: No   Sexual activity: Not on file  Other Topics Concern   Not on file  Social History Narrative   Not on file   Social Determinants of Health   Financial Resource Strain: Not on file  Food Insecurity: Not on file  Transportation Needs: Not on file  Physical Activity: Not on file  Stress: Not on file  Social Connections: Not on file        Objective:  Physical Exam: BP (!) 146/82    Pulse 69    Temp 98.5 F (36.9 C) (Temporal)    Ht 5\' 10"  (1.778 m)    Wt 200 lb 6.4 oz (90.9 kg)    SpO2 97%    BMI 28.75 kg/m   Body mass index is 28.75 kg/m. Wt Readings from Last 3 Encounters:  11/12/20 200 lb 6.4  oz (90.9 kg)  08/28/20 199 lb 6.4 oz (90.4 kg)  08/11/20 201 lb (91.2 kg)   Gen: NAD, resting comfortably HEENT: TMs normal bilaterally. OP clear. No thyromegaly noted.  CV: RRR with no murmurs appreciated Pulm: NWOB, CTAB with no crackles, wheezes, or rhonchi GI: Normal bowel sounds present. Soft, Nontender, Nondistended. MSK: no edema, cyanosis, or clubbing noted Skin: warm, dry Neuro: CN2-12 grossly intact. Strength 5/5 in upper and lower extremities. Reflexes symmetric and intact bilaterally. Normal minicog with 3/3 delayed word recall.  Psych: Normal affect and thought content     Talynn Lebon M. Jerline Pain, MD 11/12/2020 11:38 AM

## 2020-11-12 NOTE — Patient Instructions (Signed)
It was very nice to see you today!  We will check blood work today.  Please keep up the good work.  Please let me know unusual medications.  I will see you back in year for your next annual physical.  Please make to see me sooner if needed.  Take care, Dr Jerline Pain  Please try these tips to maintain a healthy lifestyle:   Eat at least 3 REAL meals and 1-2 snacks per day.  Aim for no more than 5 hours between eating.  If you eat breakfast, please do so within one hour of getting up.    Each meal should contain half fruits/vegetables, one quarter protein, and one quarter carbs (no bigger than a computer mouse)   Cut down on sweet beverages. This includes juice, soda, and sweet tea.     Drink at least 1 glass of water with each meal and aim for at least 8 glasses per day   Exercise at least 150 minutes every week.    Preventive Care 69 Years and Older, Male Preventive care refers to lifestyle choices and visits with your health care provider that can promote health and wellness. This includes:  A yearly physical exam. This is also called an annual well check.  Regular dental and eye exams.  Immunizations.  Screening for certain conditions.  Healthy lifestyle choices, such as diet and exercise. What can I expect for my preventive care visit? Physical exam Your health care provider will check:  Height and weight. These may be used to calculate body mass index (BMI), which is a measurement that tells if you are at a healthy weight.  Heart rate and blood pressure.  Your skin for abnormal spots. Counseling Your health care provider may ask you questions about:  Alcohol, tobacco, and drug use.  Emotional well-being.  Home and relationship well-being.  Sexual activity.  Eating habits.  History of falls.  Memory and ability to understand (cognition).  Work and work Statistician. What immunizations do I need?  Influenza (flu) vaccine  This is recommended  every year. Tetanus, diphtheria, and pertussis (Tdap) vaccine  You may need a Td booster every 10 years. Varicella (chickenpox) vaccine  You may need this vaccine if you have not already been vaccinated. Zoster (shingles) vaccine  You may need this after age 85. Pneumococcal conjugate (PCV13) vaccine  One dose is recommended after age 70. Pneumococcal polysaccharide (PPSV23) vaccine  One dose is recommended after age 77. Measles, mumps, and rubella (MMR) vaccine  You may need at least one dose of MMR if you were born in 1957 or later. You may also need a second dose. Meningococcal conjugate (MenACWY) vaccine  You may need this if you have certain conditions. Hepatitis A vaccine  You may need this if you have certain conditions or if you travel or work in places where you may be exposed to hepatitis A. Hepatitis B vaccine  You may need this if you have certain conditions or if you travel or work in places where you may be exposed to hepatitis B. Haemophilus influenzae type b (Hib) vaccine  You may need this if you have certain conditions. You may receive vaccines as individual doses or as more than one vaccine together in one shot (combination vaccines). Talk with your health care provider about the risks and benefits of combination vaccines. What tests do I need? Blood tests  Lipid and cholesterol levels. These may be checked every 5 years, or more frequently depending on your overall  health.  Hepatitis C test.  Hepatitis B test. Screening  Lung cancer screening. You may have this screening every year starting at age 80 if you have a 30-pack-year history of smoking and currently smoke or have quit within the past 15 years.  Colorectal cancer screening. All adults should have this screening starting at age 14 and continuing until age 63. Your health care provider may recommend screening at age 70 if you are at increased risk. You will have tests every 1-10 years, depending  on your results and the type of screening test.  Prostate cancer screening. Recommendations will vary depending on your family history and other risks.  Diabetes screening. This is done by checking your blood sugar (glucose) after you have not eaten for a while (fasting). You may have this done every 1-3 years.  Abdominal aortic aneurysm (AAA) screening. You may need this if you are a current or former smoker.  Sexually transmitted disease (STD) testing. Follow these instructions at home: Eating and drinking  Eat a diet that includes fresh fruits and vegetables, whole grains, lean protein, and low-fat dairy products. Limit your intake of foods with high amounts of sugar, saturated fats, and salt.  Take vitamin and mineral supplements as recommended by your health care provider.  Do not drink alcohol if your health care provider tells you not to drink.  If you drink alcohol: ? Limit how much you have to 0-2 drinks a day. ? Be aware of how much alcohol is in your drink. In the [place] one drink equals one 12 oz bottle of beer (355 mL), one 5 oz glass of wine (148 mL), or one 1 oz glass of hard liquor (44 mL). Lifestyle  Take daily care of your teeth and gums.  Stay active. Exercise for at least 30 minutes on 5 or more days each week.  Do not use any products that contain nicotine or tobacco, such as cigarettes, e-cigarettes, and chewing tobacco. If you need help quitting, ask your health care provider.  If you are sexually active, practice safe sex. Use a condom or other form of protection to prevent STIs (sexually transmitted infections).  Talk with your health care provider about taking a low-dose aspirin or statin. What's next?  Visit your health care provider once a year for a well check visit.  Ask your health care provider how often you should have your eyes and teeth checked.  Stay up to date on all vaccines. This information is not intended to replace advice given to you  by your health care provider. Make sure you discuss any questions you have with your health care provider. Document Revised: 11/02/2018 Document Reviewed: 11/02/2018 Elsevier Patient Education  2020 [employer].

## 2020-11-13 LAB — LIPID PANEL
Cholesterol: 186 mg/dL (ref <200)
HDL: 39 mg/dL — ABNORMAL LOW (ref >=40)
LDL Cholesterol (Calc): 113 mg/dL (calc) — ABNORMAL HIGH
Non-HDL Cholesterol (Calc): 147 mg/dL (calc) — ABNORMAL HIGH (ref <130)
Total CHOL/HDL Ratio: 4.8 (calc) (ref <5.0)
Triglycerides: 226 mg/dL — ABNORMAL HIGH (ref <150)

## 2020-11-13 LAB — TSH: TSH: 2.15 mIU/L (ref 0.40–4.50)

## 2020-11-13 LAB — CBC
HCT: 44.4 % (ref 38.5–50.0)
Hemoglobin: 15.3 g/dL (ref 13.2–17.1)
MCH: 31.4 pg (ref 27.0–33.0)
MCHC: 34.5 g/dL (ref 32.0–36.0)
MCV: 91.2 fL (ref 80.0–100.0)
MPV: 10.4 fL (ref 7.5–12.5)
Platelets: 290 10*3/uL (ref 140–400)
RBC: 4.87 10*6/uL (ref 4.20–5.80)
RDW: 12.6 % (ref 11.0–15.0)
WBC: 6.6 10*3/uL (ref 3.8–10.8)

## 2020-11-13 LAB — PSA: PSA: 3.4 ng/mL (ref <=4.0)

## 2020-11-13 LAB — HEMOGLOBIN A1C
Hgb A1c MFr Bld: 5.3 % of total Hgb (ref <5.7)
Mean Plasma Glucose: 105 mg/dL
eAG (mmol/L): 5.8 mmol/L

## 2020-11-13 LAB — COMPREHENSIVE METABOLIC PANEL
AG Ratio: 2 (calc) (ref 1.0–2.5)
ALT: 21 U/L (ref 9–46)
AST: 22 U/L (ref 10–35)
Albumin: 4.7 g/dL (ref 3.6–5.1)
Alkaline phosphatase (APISO): 67 U/L (ref 35–144)
BUN: 9 mg/dL (ref 7–25)
CO2: 23 mmol/L (ref 20–32)
Calcium: 9.5 mg/dL (ref 8.6–10.3)
Chloride: 104 mmol/L (ref 98–110)
Creat: 0.96 mg/dL (ref 0.70–1.25)
Globulin: 2.3 g/dL (calc) (ref 1.9–3.7)
Glucose, Bld: 88 mg/dL (ref 65–99)
Potassium: 4.5 mmol/L (ref 3.5–5.3)
Sodium: 136 mmol/L (ref 135–146)
Total Bilirubin: 0.4 mg/dL (ref 0.2–1.2)
Total Protein: 7 g/dL (ref 6.1–8.1)

## 2020-11-13 LAB — URIC ACID: Uric Acid, Serum: 5.5 mg/dL (ref 4.0–8.0)

## 2020-11-13 NOTE — Progress Notes (Signed)
Please inform patient of the following:  His cholesterol levels are borderline but everything else is NORMAL. Do not need to start any medications but he should continue to work on diet and exercise and we can recheck in a year.  Algis Greenhouse. Jerline Pain, MD 11/13/2020 9:31 AM

## 2020-12-09 ENCOUNTER — Encounter: Payer: Medicare Other | Admitting: Internal Medicine

## 2021-02-19 IMAGING — DX DG FOOT COMPLETE 3+V*L*
3 series · 3 of 3 positions shown · non-contrast
Comparison: None.

CLINICAL DATA: Left foot pain, second digit swelling for 6 months

EXAM:
LEFT FOOT - COMPLETE 3+ VIEW

[foot ap]
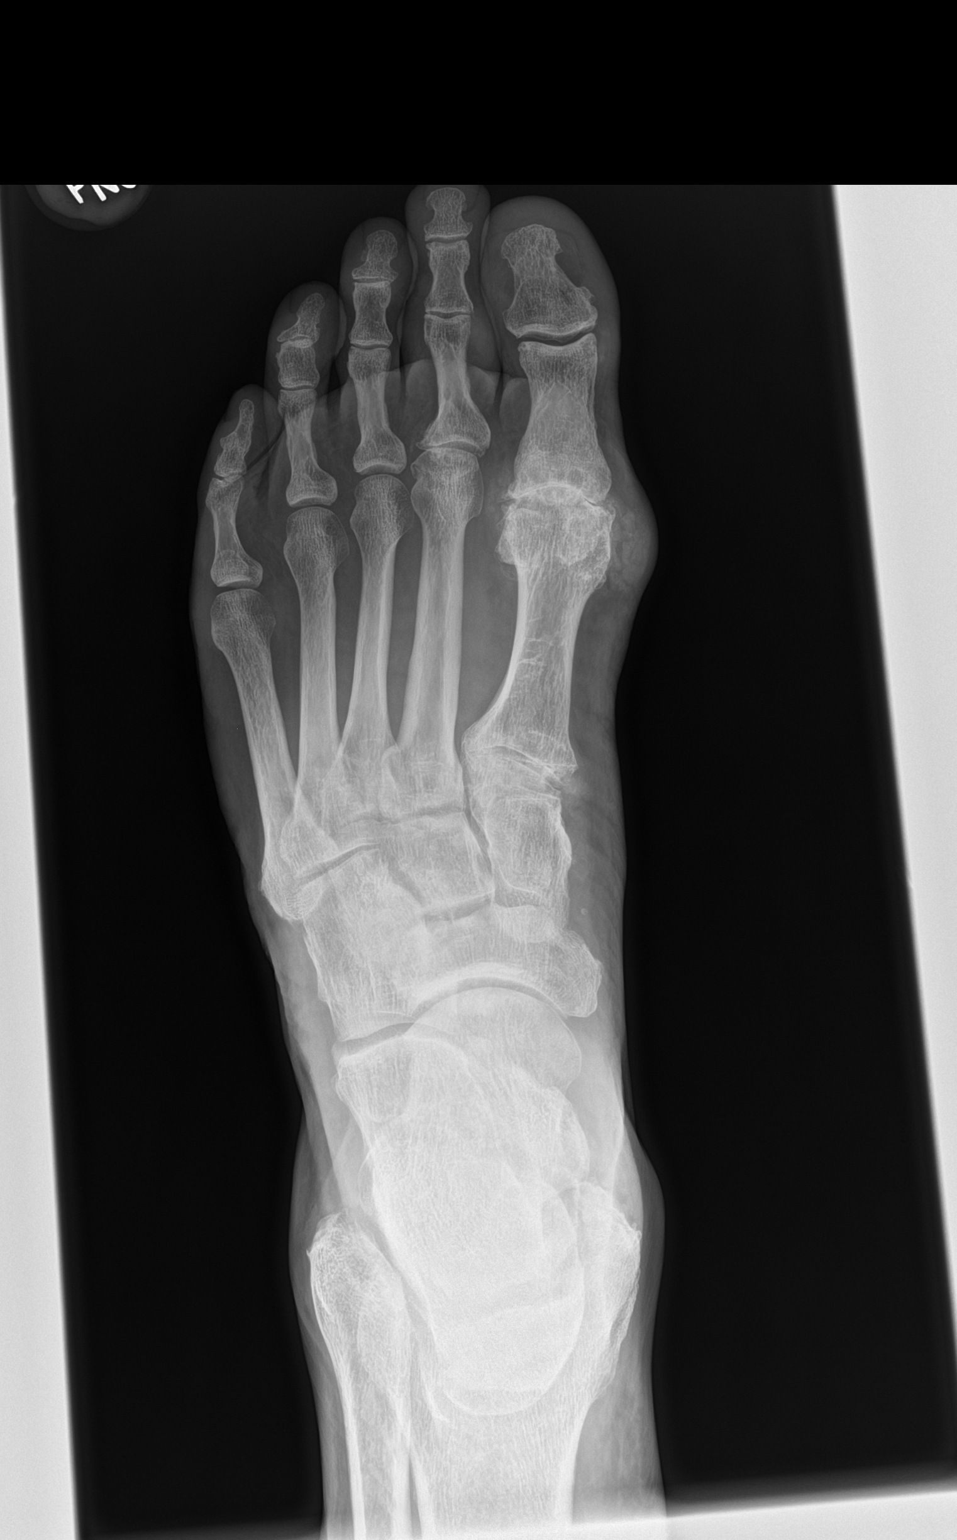

[foot obl]
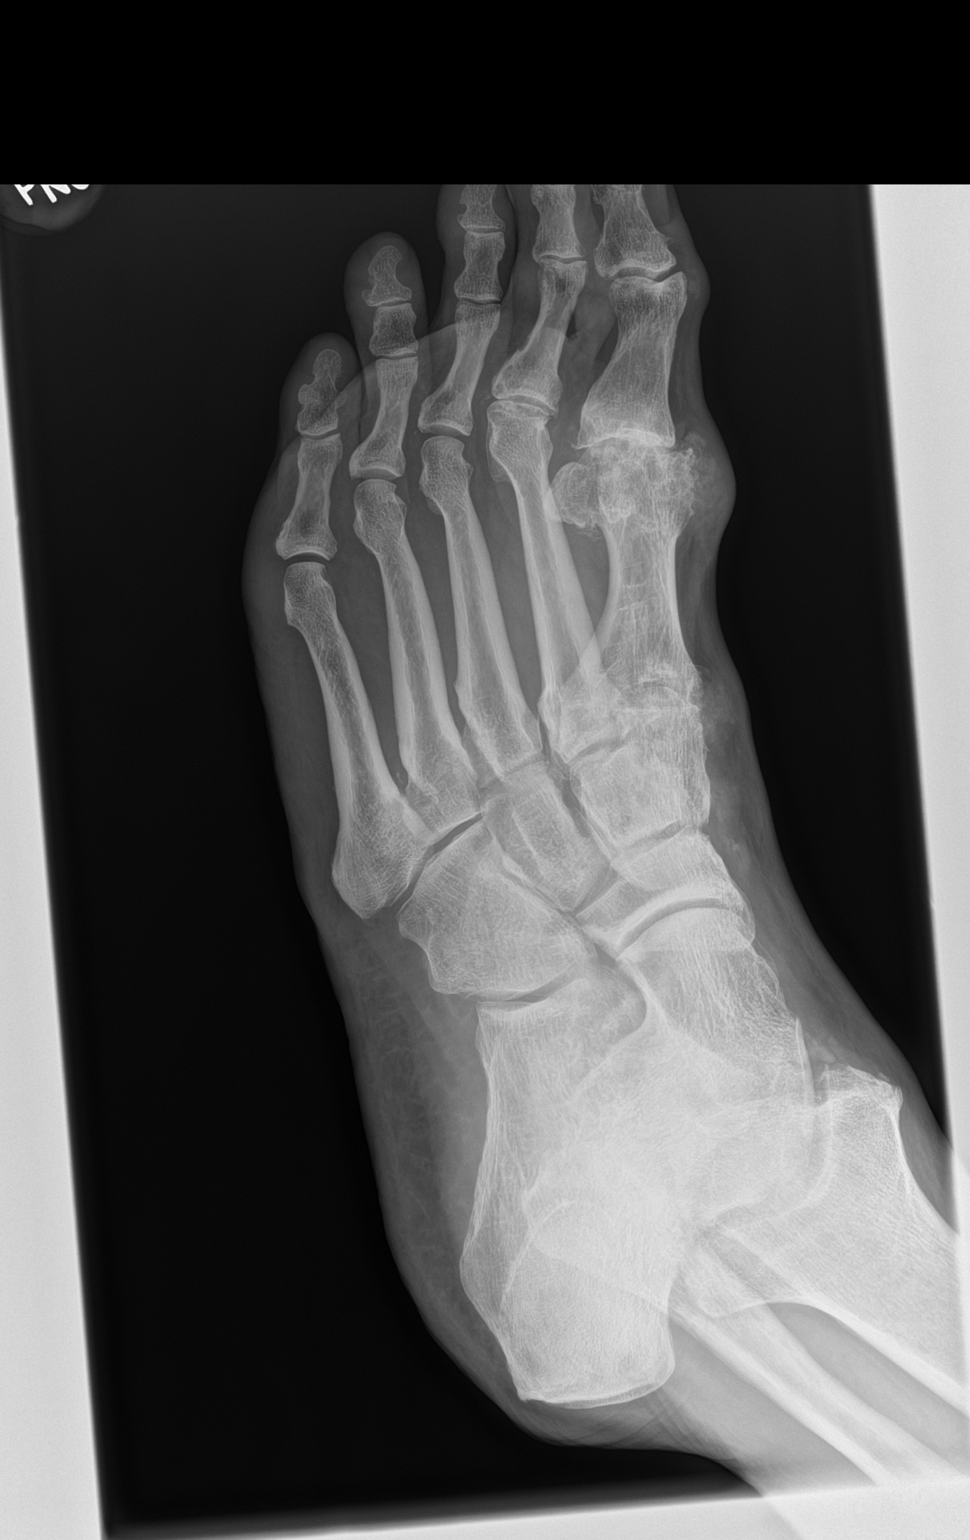

[foot lat]
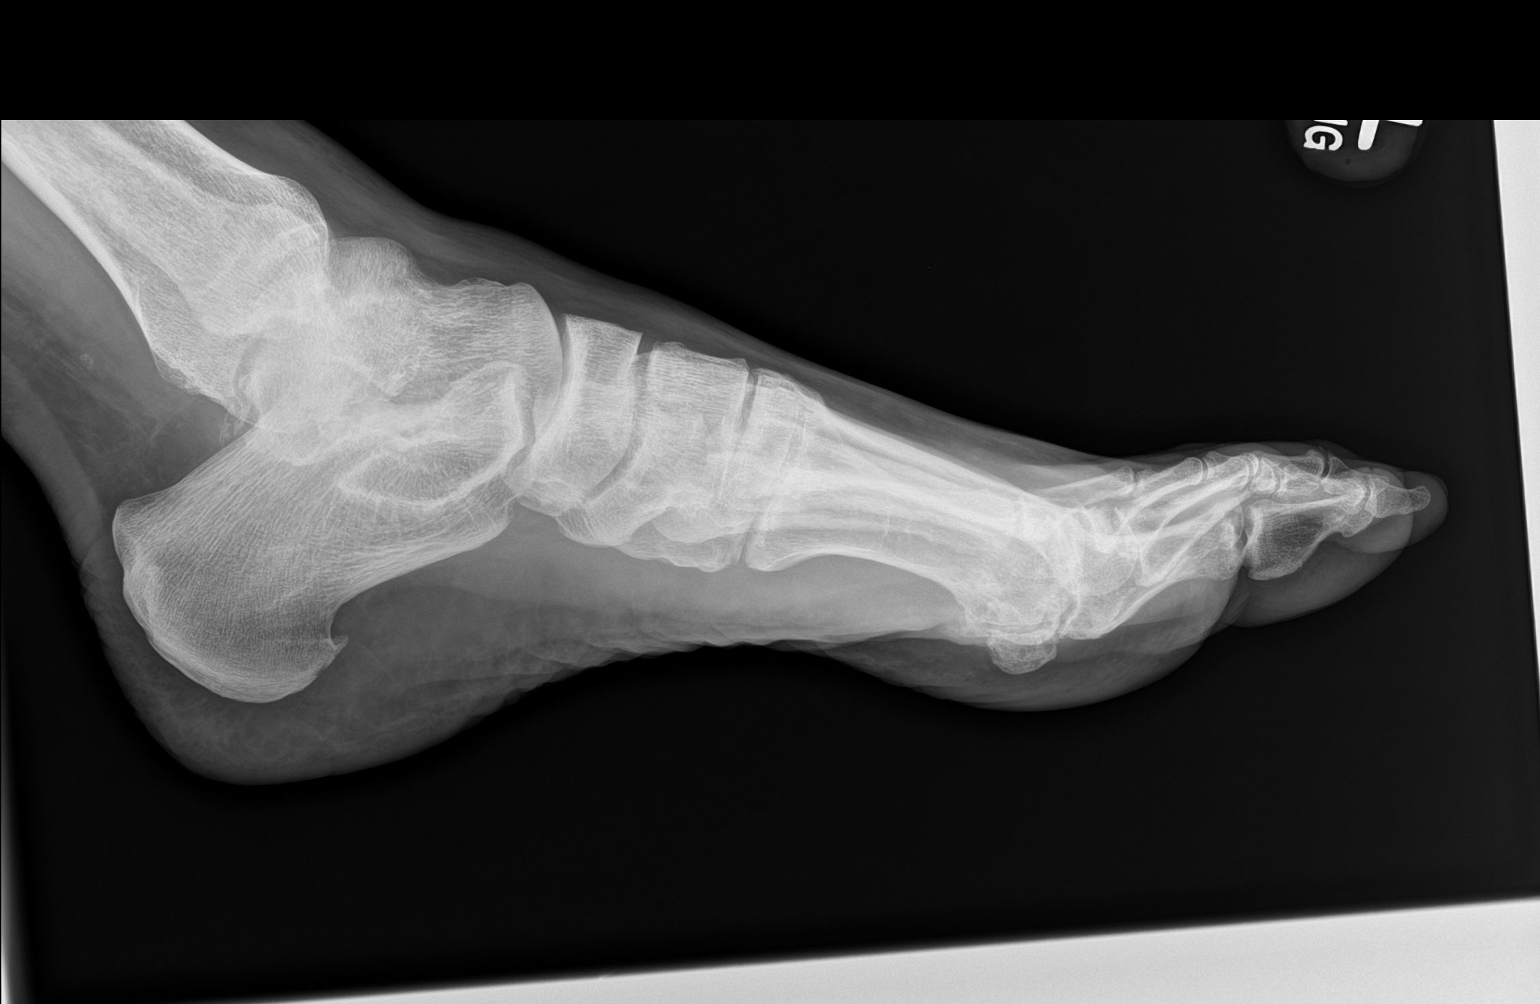

[3 of 3 positions shown; findings below may reference images not displayed]

FINDINGS: Frontal, oblique, lateral views of the left foot are obtained. There
is severe joint space narrowing of the first metatarsophalangeal
joint, with subchondral cyst formation and marked marginal
osteophytes. I do not see any definite erosive changes. There is
also joint space narrowing and osteophyte formation at the first
tarsometatarsal joint. Areas of soft tissue calcification are seen
adjacent to the first tarsometatarsal and metatarsophalangeal
joints.

Mild osteoarthritis is seen at the second metatarsophalangeal joint
with joint space narrowing and osteophyte formation.

No fracture, subluxation, or dislocation.
IMPRESSION: 1. Marked joint space narrowing and osteophyte formation at the
first tarsometatarsal joint and metatarsophalangeal joints, with
areas of overlying soft tissue calcification. Given the lack of
erosive change, I would favor osteoarthritis over other arthropathy
such as gout.
2. Mild osteoarthritis of the second metatarsal phalangeal joint.
3. No acute fracture.

## 2021-03-16 ENCOUNTER — Telehealth: Payer: Self-pay

## 2021-03-16 NOTE — Telephone Encounter (Signed)
Please advise 

## 2021-03-16 NOTE — Telephone Encounter (Signed)
Patient would like to know if recommend a booster his last was 08/28/20

## 2021-03-16 NOTE — Telephone Encounter (Signed)
It is safe for him to get his booster now if he wishes, however given the relatively low community transmission rates we are seeing right now it would make sense for him to wait until late summer or early fall before getting his next booster.  Troy [name]. Jerline Pain, MD 03/16/2021 2:40 PM

## 2021-03-16 NOTE — Telephone Encounter (Signed)
Left message to return call to our office at their convenience.  

## 2021-03-19 ENCOUNTER — Encounter: Payer: Self-pay | Admitting: *Deleted

## 2021-03-19 NOTE — Telephone Encounter (Signed)
Send information via mychart  

## 2021-05-13 ENCOUNTER — Ambulatory Visit: Payer: Medicare PPO | Admitting: Family Medicine

## 2021-06-04 ENCOUNTER — Encounter: Payer: Self-pay | Admitting: Gastroenterology

## 2021-06-15 ENCOUNTER — Encounter (HOSPITAL_BASED_OUTPATIENT_CLINIC_OR_DEPARTMENT_OTHER): Payer: Self-pay | Admitting: *Deleted

## 2021-06-15 ENCOUNTER — Emergency Department (HOSPITAL_BASED_OUTPATIENT_CLINIC_OR_DEPARTMENT_OTHER)
Admission: EM | Admit: 2021-06-15 | Discharge: 2021-06-15 | Disposition: A | Discharge status: Home / Self Care | Payer: Medicare PPO | Attending: Emergency Medicine | Admitting: Emergency Medicine

## 2021-06-15 ENCOUNTER — Other Ambulatory Visit: Payer: Self-pay

## 2021-06-15 ENCOUNTER — Telehealth: Payer: Self-pay

## 2021-06-15 DIAGNOSIS — K1370 Unspecified lesions of oral mucosa: Secondary | ICD-10-CM | POA: Diagnosis present

## 2021-06-15 DIAGNOSIS — K130 Diseases of lips: Secondary | ICD-10-CM

## 2021-06-15 DIAGNOSIS — L988 Other specified disorders of the skin and subcutaneous tissue: Secondary | ICD-10-CM | POA: Diagnosis not present

## 2021-06-15 DIAGNOSIS — Z96642 Presence of left artificial hip joint: Secondary | ICD-10-CM | POA: Diagnosis not present

## 2021-06-15 LAB — COMPREHENSIVE METABOLIC PANEL
ALT: 21 U/L (ref 0–44)
AST: 19 U/L (ref 15–41)
Albumin: 4.4 g/dL (ref 3.5–5.0)
Alkaline Phosphatase: 51 U/L (ref 38–126)
Anion gap: 8 (ref 5–15)
BUN: 10 mg/dL (ref 8–23)
CO2: 24 mmol/L (ref 22–32)
Calcium: 8.9 mg/dL (ref 8.9–10.3)
Chloride: 104 mmol/L (ref 98–111)
Creatinine, Ser: 0.83 mg/dL (ref 0.61–1.24)
GFR, Estimated: >60 mL/min (ref >60)
Glucose, Bld: 94 mg/dL (ref 70–99)
Potassium: 4.2 mmol/L (ref 3.5–5.1)
Sodium: 136 mmol/L (ref 135–145)
Total Bilirubin: 0.5 mg/dL (ref 0.3–1.2)
Total Protein: 6.9 g/dL (ref 6.5–8.1)

## 2021-06-15 LAB — CBC WITH DIFFERENTIAL/PLATELET
Abs Immature Granulocytes: 0.02 10*3/uL (ref 0.00–0.07)
Basophils Absolute: 0 10*3/uL (ref 0.0–0.1)
Basophils Relative: 1 %
Eosinophils Absolute: 0.1 10*3/uL (ref 0.0–0.5)
Eosinophils Relative: 1 %
HCT: 42.5 % (ref 39.0–52.0)
Hemoglobin: 14.8 g/dL (ref 13.0–17.0)
Immature Granulocytes: 0 %
Lymphocytes Relative: 44 %
Lymphs Abs: 3.1 10*3/uL (ref 0.7–4.0)
MCH: 31.7 pg (ref 26.0–34.0)
MCHC: 34.8 g/dL (ref 30.0–36.0)
MCV: 91 fL (ref 80.0–100.0)
Monocytes Absolute: 0.7 10*3/uL (ref 0.1–1.0)
Monocytes Relative: 10 %
Neutro Abs: 3 10*3/uL (ref 1.7–7.7)
Neutrophils Relative %: 44 %
Platelets: 231 10*3/uL (ref 150–400)
RBC: 4.67 MIL/uL (ref 4.22–5.81)
RDW: 12.2 % (ref 11.5–15.5)
WBC: 6.9 10*3/uL (ref 4.0–10.5)
nRBC: 0 % (ref 0.0–0.2)

## 2021-06-15 LAB — URINALYSIS, ROUTINE W REFLEX MICROSCOPIC
Bilirubin Urine: NEGATIVE
Glucose, UA: NEGATIVE mg/dL
Hgb urine dipstick: NEGATIVE
Ketones, ur: NEGATIVE mg/dL
Leukocytes,Ua: NEGATIVE
Nitrite: NEGATIVE
Protein, ur: NEGATIVE mg/dL
Specific Gravity, Urine: 1.01 (ref 1.005–1.030)
pH: 6.5 (ref 5.0–8.0)

## 2021-06-15 MED ORDER — SODIUM CHLORIDE 0.9 % IV BOLUS
1000.0000 mL | Freq: Once | INTRAVENOUS | Status: AC
  Administered 2021-06-15: 1000 mL via INTRAVENOUS

## 2021-06-15 NOTE — ED Provider Notes (Signed)
[facility] Provider Note  CSN: [id number] Arrival date & time: 06/15/21 1636    History Chief Complaint  Patient presents with   Mouth Lesions    Troy [name] is a 70 y.o. male with no significant PMH was advised by his PCP to come to the ED after his wife called their office today asking for him to be seen. She reports he has been feeling tired and out of it the last several weeks. She states he goes out to play golf or workout in the mornings but then takes a nap and lies around all afternoon. She feels like he may have been getting dehydrated. They have been at the beach for a few days, he was drinking more water and she reports he has been better the last 2 days but she was still concerned. The patient only is concerned about a red, irritated area on his lower lip that has been present for 2 months and not responding to lip balms.    Past Medical History:  Diagnosis Date   BPH (benign prostatic hyperplasia)     Past Surgical History:  Procedure Laterality Date   COLONOSCOPY  11-24-2005   STARK-TICS ONLY   EYE SURGERY Bilateral 2005   lasik   JOINT REPLACEMENT Left 2007   Lt THR - Dr Ronnie Derby    Family History  Problem Relation Age of Onset   Heart disease Father    Cancer Brother        Lung Cancer   Colon cancer Neg Hx    Colon polyps Neg Hx    Esophageal cancer Neg Hx    Rectal cancer Neg Hx    Stomach cancer Neg Hx    Prostate cancer Neg Hx     Social History   Tobacco Use   Smoking status: Never   Smokeless tobacco: Never  Vaping Use   Vaping Use: Never used  Substance Use Topics   Alcohol use: Yes    Comment: rarely   Drug use: Never     Home Medications Prior to Admission medications   Medication Sig Start Date End Date Taking? Authorizing Provider  Multiple Vitamin (MULTIVITAMIN) capsule Take by mouth.   Yes [provider]  sildenafil (REVATIO) 20 MG tablet Take 2 to 5 tablets Daily as needed for XXXX  10/26/19   Unk Pinto, MD     Allergies    Patient has no known allergies.   Review of Systems   Review of Systems A comprehensive review of systems was completed and negative except as noted in HPI.    Physical Exam BP (!) 193/92 (BP Location: Right Arm)   Pulse 60   Temp 97.6 F (36.4 C) (Oral)   Resp 16   Ht '5\' 10"'$  (1.778 m)   Wt 90.7 kg   SpO2 100%   BMI 28.70 kg/m   Physical Exam Vitals and nursing note reviewed.  Constitutional:      Appearance: Normal appearance.  HENT:     Head: Normocephalic and atraumatic.     Nose: Nose normal.     Mouth/Throat:     Mouth: Mucous membranes are dry.     Comments: Dry, chapped lower lip, no discrete lesion Eyes:     Extraocular Movements: Extraocular movements intact.     Conjunctiva/sclera: Conjunctivae normal.  Cardiovascular:     Rate and Rhythm: Normal rate.  Pulmonary:     Effort: Pulmonary effort is normal.     Breath sounds:  Normal breath sounds.  Abdominal:     General: Abdomen is flat.     Palpations: Abdomen is soft.     Tenderness: There is no abdominal tenderness.  Musculoskeletal:        General: No swelling. Normal range of motion.     Cervical back: Neck supple.  Skin:    General: Skin is warm and dry.  Neurological:     General: No focal deficit present.     Mental Status: He is alert.  Psychiatric:        Mood and Affect: Mood normal.     ED Results / Procedures / Treatments   Labs (all labs ordered are listed, but only abnormal results are displayed) Labs Reviewed  COMPREHENSIVE METABOLIC PANEL  CBC WITH DIFFERENTIAL/PLATELET  URINALYSIS, ROUTINE W REFLEX MICROSCOPIC    EKG None   Radiology No results found.  Procedures Procedures  Medications Ordered in the ED Medications  sodium chloride 0.9 % bolus 1,000 mL (1,000 mLs Intravenous New Bag/Given 06/15/21 2217)     MDM Rules/Calculators/A&P MDM Patient is minimizing his symptoms and only is concerned about his lip  lesion which appears to be chapped to me. I do not appreciate a discrete lesion that would be a concern for malignancy. Wife on the phone is more concerned about his lack of energy in recent weeks and would like for him to have some lab tests done while he is here. Will give a liter of saline in the meantime. Patient's urine at bedside is yellow and clear.   ED Course  I have reviewed the triage vital signs and the nursing notes.  Pertinent labs & imaging results that were available during my care of the patient were reviewed by me and considered in my medical decision making (see chart for details).  Clinical Course as of 06/15/21 2301  Mon Jun 15, 2021  2229 CBC is normal, UA is clear.  [CS]  2259 BMP is normal, patient has been hypertensive here but asymptomatic and no signs of end organ damage. Advised to stay out of the heat and follow up with PCP in a few days if not improving.  [CS]    Clinical Course User Index [CS] Truddie Hidden, MD    Final Clinical Impression(s) / ED Diagnoses Final diagnoses:  Chapped lips    Rx / DC Orders ED Discharge Orders     None        Truddie Hidden, MD 06/15/21 [phone]

## 2021-06-15 NOTE — Telephone Encounter (Signed)
Spoke with Hasna who agreed that patient needed to be seen at [facility], as patient is currently out of town and refusing to go to ED.  Nurse Assessment Nurse: Hardin Negus, RN, Mardene Celeste Date/Time Eilene Ghazi Time): 06/15/2021 10:45:19 AM Confirm and document reason for call. If symptomatic, describe symptoms. ---For weeks her husband has been very tired in the afternoons, seems confused at times, chills and is very weak. He goes out to play golf some days. This morning he was out working out in the heat. He is fine right now. last time was about 2 days Does the patient have any new or worsening symptoms? ---Yes Will a triage be completed? ---Yes Related visit to physician within the last 2 weeks? ---No Does the PT have any chronic conditions? (i.e. diabetes, asthma, this includes High risk factors for pregnancy, etc.) ---Yes List chronic conditions. ---arthritis Is this a behavioral health or substance abuse call? ---No Guidelines Guideline Title Affirmed Question Affirmed Notes Nurse Date/Time Eilene Ghazi Time) Confusion - Delirium [1] Acting confused (e.g., disoriented, Oren Bracket 06/15/2021 10:52:29 AM PLEASE NOTE: All timestamps contained within this report are represented as [place] Standard Time. CONFIDENTIALTY NOTICE: This fax transmission is intended only for the addressee. It contains information that is legally privileged, confidential or otherwise protected from use or disclosure. If you are not the intended recipient, you are strictly prohibited from reviewing, disclosing, copying using or disseminating any of this information or taking any action in reliance on or regarding this information. If you have received this fax in error, please notify us immediately by telephone so that we can arrange for its return to [place]. Phone: [phone], Toll-Free: [phone], Fax: [phone] Page: 2 of 2 Call Id: [id number] Guidelines Guideline Title Affirmed Question Affirmed Notes Nurse  Date/Time Eilene Ghazi Time) slurred speech) AND [2] brief (now gone) Disp. Time Eilene Ghazi Time) Disposition Final User 06/15/2021 11:01:19 AM See HCP within 4 Hours (or PCP triage) Yes Hardin Negus, RN, [place] Disagree/Comply Disagree Caller Understands Yes PreDisposition Call Doctor Care Advice Given Per Guideline SEE HCP (OR PCP TRIAGE) WITHIN 4 HOURS: CALL BACK IF: * You become worse CARE ADVICE given per ConfusionDelirium (Adult) guideline. Comments User: Ledora Bottcher, RN Date/Time Eilene Ghazi Time): 06/15/2021 10:48:06 AM She thinks he maybe dyhrated. User: Ledora Bottcher, RN Date/Time Eilene Ghazi Time): 06/15/2021 11:01:09 AM Spoke with the office , The Dr's nurse states " thhe pt should go to the ER:" Spoke with the wife, who refused. She wants him to be seen in the office. Referrals GO TO FACILITY REFUSED

## 2021-06-15 NOTE — Telephone Encounter (Signed)
FYI  Patient was advised to go to [facility] since is out of area

## 2021-06-15 NOTE — ED Notes (Signed)
This RN presented the AVS utilizing Teachback Method. Patient verbalizes understanding of Discharge Instructions. Opportunity for Questioning and Answers were provided. Patient Discharged from ED ambulatory to Home via Self.

## 2021-06-15 NOTE — ED Triage Notes (Signed)
Pt is feeling dehydrated for the last 2 weeks but can not see his PCP.  Lip lesion has been for a month.

## 2021-06-18 ENCOUNTER — Encounter: Payer: Self-pay | Admitting: Gastroenterology

## 2021-07-09 DIAGNOSIS — L578 Other skin changes due to chronic exposure to nonionizing radiation: Secondary | ICD-10-CM | POA: Diagnosis not present

## 2021-07-09 DIAGNOSIS — L309 Dermatitis, unspecified: Secondary | ICD-10-CM | POA: Diagnosis not present

## 2021-07-29 ENCOUNTER — Other Ambulatory Visit: Payer: Self-pay

## 2021-07-29 ENCOUNTER — Ambulatory Visit (INDEPENDENT_AMBULATORY_CARE_PROVIDER_SITE_OTHER): Payer: Medicare PPO | Admitting: Otolaryngology

## 2021-07-29 DIAGNOSIS — H6123 Impacted cerumen, bilateral: Secondary | ICD-10-CM

## 2021-07-29 NOTE — Progress Notes (Signed)
HPI: Troy [name] is a 70 y.o. male who presents for evaluation of wax buildup in his ears..  He has only minimal hearing problems.  Past Medical History:  Diagnosis Date   BPH (benign prostatic hyperplasia)    Past Surgical History:  Procedure Laterality Date   COLONOSCOPY  11-24-2005   [facility]-TICS ONLY   EYE SURGERY Bilateral 2005   lasik   JOINT REPLACEMENT Left 2007   Lt THR - Dr Ronnie Derby   Social History   Socioeconomic History   Marital status: Married    Spouse name: Not on file   Number of children: Not on file   Years of education: Not on file   Highest education level: Not on file  Occupational History   Not on file  Tobacco Use   Smoking status: Never   Smokeless tobacco: Never  Vaping Use   Vaping Use: Never used  Substance and Sexual Activity   Alcohol use: Yes    Comment: rarely   Drug use: Never   Sexual activity: Yes  Other Topics Concern   Not on file  Social History Narrative   Not on file   Social Determinants of Health   Financial Resource Strain: Not on file  Food Insecurity: Not on file  Transportation Needs: Not on file  Physical Activity: Not on file  Stress: Not on file  Social Connections: Not on file   Family History  Problem Relation Age of Onset   Heart disease Father    Cancer Brother        Lung Cancer   Colon cancer Neg Hx    Colon polyps Neg Hx    Esophageal cancer Neg Hx    Rectal cancer Neg Hx    Stomach cancer Neg Hx    Prostate cancer Neg Hx    No Known Allergies Prior to Admission medications   Medication Sig Start Date End Date Taking? Authorizing Provider  Multiple Vitamin (MULTIVITAMIN) capsule Take by mouth.    [provider]  sildenafil (REVATIO) 20 MG tablet Take 2 to 5 tablets Daily as needed for XXXX 10/26/19   Unk Pinto, MD     Positive ROS: Otherwise negative  All other systems have been reviewed and were otherwise negative with the exception of those mentioned in the HPI and as  above.  Physical Exam: Constitutional: Alert, well-appearing, no acute distress Ears: External ears without lesions or tenderness. Ear canals are small bilaterally with a moderate amount of wax buildup in both ears as well as multiple hairs that were removed with forceps and curettes.  TMs were clear bilaterally.  Hearing screening with a 512 1024 tuning fork revealed minimal hearing problems. Nasal: External nose without lesions. Clear nasal passages Oral: Oropharynx clear. Neck: No palpable adenopathy or masses Respiratory: Breathing comfortably  Skin: No facial/neck lesions or rash noted.  Cerumen impaction removal  Date/Time: 07/29/2021 12:24 PM Performed by: Rozetta Nunnery, MD Authorized by: Rozetta Nunnery, MD   Consent:    Consent obtained:  Verbal   Consent given by:  Patient   Risks discussed:  Pain and bleeding Procedure details:    Location:  L ear and R ear   Procedure type: curette and forceps   Post-procedure details:    Inspection:  TM intact and canal normal   Hearing quality:  Improved   Procedure completion:  Tolerated well, no immediate complications Comments:     TMs are clear bilaterally.  Assessment: Moderate wax buildup in both  ears.  Plan: Ear canals were cleaned in the office.  TMs are otherwise clear.  I discussed with him concerning hearing screening his hearing seems pretty good however if he is having hearing difficulty would recommend obtaining formal audiologic testing.  Radene Journey, MD

## 2021-09-17 ENCOUNTER — Encounter: Payer: Medicare PPO | Admitting: Gastroenterology

## 2021-09-23 DIAGNOSIS — D2272 Melanocytic nevi of left lower limb, including hip: Secondary | ICD-10-CM | POA: Diagnosis not present

## 2021-09-23 DIAGNOSIS — D1801 Hemangioma of skin and subcutaneous tissue: Secondary | ICD-10-CM | POA: Diagnosis not present

## 2021-09-23 DIAGNOSIS — L821 Other seborrheic keratosis: Secondary | ICD-10-CM | POA: Diagnosis not present

## 2021-09-23 DIAGNOSIS — D2271 Melanocytic nevi of right lower limb, including hip: Secondary | ICD-10-CM | POA: Diagnosis not present

## 2021-09-23 DIAGNOSIS — L309 Dermatitis, unspecified: Secondary | ICD-10-CM | POA: Diagnosis not present

## 2021-09-23 DIAGNOSIS — L570 Actinic keratosis: Secondary | ICD-10-CM | POA: Diagnosis not present

## 2021-09-30 DIAGNOSIS — Z23 Encounter for immunization: Secondary | ICD-10-CM | POA: Diagnosis not present

## 2021-10-07 ENCOUNTER — Other Ambulatory Visit: Payer: Self-pay

## 2021-10-07 ENCOUNTER — Ambulatory Visit (AMBULATORY_SURGERY_CENTER): Payer: Medicare PPO | Admitting: *Deleted

## 2021-10-07 ENCOUNTER — Encounter: Payer: Self-pay | Admitting: Gastroenterology

## 2021-10-07 VITALS — Ht 70.0 in | Wt 200.0 lb

## 2021-10-07 DIAGNOSIS — Z86010 Personal history of colonic polyps: Secondary | ICD-10-CM

## 2021-10-07 MED ORDER — NA SULFATE-K SULFATE-MG SULF 17.5-3.13-1.6 GM/177ML PO SOLN: 1.0000 | Freq: Once | ORAL | 0 refills | Status: AC

## 2021-10-07 NOTE — Progress Notes (Signed)
No egg or soy allergy known to patient  No issues known to pt with past sedation with any surgeries or procedures Patient denies ever being told they had issues or difficulty with intubation  No FH of Malignant Hyperthermia Pt is not on diet pills Pt is not on  home 02  Pt is not on blood thinners  Pt denies issues with constipation  No A fib or A flutter  Pt is fully vaccinated  for Covid    NO PA's for preps discussed with pt In PV today  Discussed with pt there will be an out-of-pocket cost for prep and that varies from $0 to 70 +  dollars - pt verbalized understanding   Due to the COVID-19 pandemic we are asking patients to follow certain guidelines in PV and the [facility]   Pt aware of COVID protocols and [facility] guidelines   PV completed over the phone. Pt verified name, DOB, address and insurance during PV today.  Pt mailed instruction packet with copy of consent form to read and not return, and instructions.   Pt encouraged to call with questions or issues.  If pt has My chart, procedure instructions sent via My Chart

## 2021-10-13 ENCOUNTER — Telehealth: Payer: Self-pay | Admitting: Gastroenterology

## 2021-10-13 NOTE — Telephone Encounter (Signed)
[employer] rep called to advise the auth on file for the colonoscopy procedure was rescheduled for 10/19/21 and the auth date is effective from 10/21/21.

## 2021-10-19 ENCOUNTER — Other Ambulatory Visit: Payer: Self-pay

## 2021-10-19 ENCOUNTER — Ambulatory Visit (AMBULATORY_SURGERY_CENTER): Payer: Medicare PPO | Admitting: Gastroenterology

## 2021-10-19 ENCOUNTER — Other Ambulatory Visit: Payer: Self-pay | Admitting: Gastroenterology

## 2021-10-19 ENCOUNTER — Encounter: Payer: Self-pay | Admitting: Gastroenterology

## 2021-10-19 VITALS — BP 119/68 | HR 53 | Temp 97.3°F | Resp 14 | Ht 70.0 in | Wt 200.0 lb

## 2021-10-19 DIAGNOSIS — Z86010 Personal history of colonic polyps: Secondary | ICD-10-CM

## 2021-10-19 DIAGNOSIS — D122 Benign neoplasm of ascending colon: Secondary | ICD-10-CM | POA: Diagnosis not present

## 2021-10-19 MED ORDER — SODIUM CHLORIDE 0.9 % IV SOLN: 500.0000 mL | Freq: Once | INTRAVENOUS | Status: DC

## 2021-10-19 NOTE — Op Note (Signed)
[facility] Patient Name: Troy Troy [name] Procedure Date: 10/19/2021 8:47 AM MRN: [id number] Endoscopist: Ladene Artist , MD Age: 70 Referring MD:  Date of Birth: Troy [name], 1952 Gender: Male Account #: [id number] Procedure:                Colonoscopy Indications:              Surveillance: Personal history of adenomatous                            polyps on last colonoscopy 5 years ago Medicines:                Monitored Anesthesia Care Procedure:                Pre-Anesthesia Assessment:                           - Prior to the procedure, a History and Physical                            was performed, and patient medications and                            allergies were reviewed. The patient's tolerance of                            previous anesthesia was also reviewed. The risks                            and benefits of the procedure and the sedation                            options and risks were discussed with the patient.                            All questions were answered, and informed consent                            was obtained. Prior Anticoagulants: The patient has                            taken no previous anticoagulant or antiplatelet                            agents. ASA Grade Assessment: II - A patient with                            mild systemic disease. After reviewing the risks                            and benefits, the patient was deemed in                            satisfactory condition to undergo the procedure.  After obtaining informed consent, the colonoscope                            was passed under direct vision. Throughout the                            procedure, the patient's blood pressure, pulse, and                            oxygen saturations were monitored continuously. The                            CF [id number] #[id number] was introduced through the anus                            and advanced to the the  cecum, identified by                            appendiceal orifice and ileocecal valve. The                            ileocecal valve, appendiceal orifice, and rectum                            were photographed. The quality of the bowel                            preparation was excellent. The colonoscopy was                            performed without difficulty. The patient tolerated                            the procedure well. Scope In: 9:00:25 AM Scope Out: 9:16:03 AM Scope Withdrawal Time: 0 hours 11 minutes 42 seconds  Total Procedure Duration: 0 hours 15 minutes 38 seconds  Findings:                 The perianal and digital rectal examinations were                            normal.                           A 7 mm polyp was found in the ascending colon. The                            polyp was sessile. The polyp was removed with a                            cold snare. Resection and retrieval were complete.                           A few small-mouthed diverticula were found in the  right colon. There was no evidence of diverticular                            bleeding.                           Multiple small-mouthed diverticula were found in                            the left colon. There was no evidence of                            diverticular bleeding.                           Internal hemorrhoids were found during                            retroflexion. The hemorrhoids were moderate and                            Grade I (internal hemorrhoids that do not prolapse).                           The exam was otherwise without abnormality on                            direct and retroflexion views. Complications:            No immediate complications. Estimated blood loss:                            None. Estimated Blood Loss:     Estimated blood loss: none. Impression:               - One 7 mm polyp in the ascending colon, removed                             with a cold snare. Resected and retrieved.                           - Mild diverticulosis in the right colon.                           - Moderate diverticulosis in the left colon.                           - Internal hemorrhoids.                           - The examination was otherwise normal on direct                            and retroflexion views. Recommendation:           - Repeat colonoscopy after studies are complete for  surveillance based on pathology results.                           - Patient has a contact number available for                            emergencies. The signs and symptoms of potential                            delayed complications were discussed with the                            patient. Return to normal activities tomorrow.                            Written discharge instructions were provided to the                            patient.                           - High fiber diet.                           - Continue present medications.                           - Await pathology results. Ladene Artist, MD 10/19/2021 9:20:16 AM This report has been signed electronically.

## 2021-10-19 NOTE — Progress Notes (Signed)
CW - VS   Pt's states no medical or surgical changes since previsit or office visit.

## 2021-10-19 NOTE — Progress Notes (Signed)
Called to room to assist during endoscopic procedure.  Patient ID and intended procedure confirmed with present staff. Received instructions for my participation in the procedure from the performing physician.  

## 2021-10-19 NOTE — Progress Notes (Signed)
To PACU, VSS. Report to [email or web address] 

## 2021-10-19 NOTE — Progress Notes (Signed)
History & Physical  Primary Care Physician:  Vivi Barrack, MD Primary Gastroenterologist: Lucio Edward, MD  CHIEF COMPLAINT:  Personal history of colon polyps   HPI: Troy [name] is a 70 y.o. male with a history of adenomatous colon polyps on colonoscopy in 2017 and is here for colonoscopy.    Past Medical History:  Diagnosis Date   BPH (benign prostatic hyperplasia)     Past Surgical History:  Procedure Laterality Date   COLONOSCOPY  11/24/2005   [facility] ONLY   EYE SURGERY Bilateral 11/23/2003   lasik   JOINT REPLACEMENT Left 11/22/2005   Lt THR - Dr Ronnie Derby   POLYPECTOMY  2017   TA x 2    Prior to Admission medications   Medication Sig Start Date End Date Taking? Authorizing Provider  ibuprofen (ADVIL) 100 MG tablet Take by mouth.    [provider]  Multiple Vitamin (MULTIVITAMIN) capsule Take by mouth.    [provider]  sildenafil (REVATIO) 20 MG tablet Take 2 to 5 tablets Daily as needed for XXXX 10/26/19   Unk Pinto, MD    Current Outpatient Medications  Medication Sig Dispense Refill   ibuprofen (ADVIL) 100 MG tablet Take by mouth.     Multiple Vitamin (MULTIVITAMIN) capsule Take by mouth.     sildenafil (REVATIO) 20 MG tablet Take 2 to 5 tablets Daily as needed for XXXX 90 tablet 9   Current Facility-Administered Medications  Medication Dose Route Frequency Provider Last Rate Last Admin   0.9 %  sodium chloride infusion  500 mL Intravenous Once Ladene Artist, MD        Allergies as of 10/19/2021   (No Known Allergies)    Family History  Problem Relation Age of Onset   Heart disease Father    Cancer Brother        Lung Cancer   Colon cancer Neg Hx    Colon polyps Neg Hx    Esophageal cancer Neg Hx    Rectal cancer Neg Hx    Stomach cancer Neg Hx    Prostate cancer Neg Hx     Social History   Socioeconomic History   Marital status: Married    Spouse name: Not on file   Number of children: Not on file    Years of education: Not on file   Highest education level: Not on file  Occupational History   Not on file  Tobacco Use   Smoking status: Never   Smokeless tobacco: Never  Vaping Use   Vaping Use: Never used  Substance and Sexual Activity   Alcohol use: Yes    Comment: rarely   Drug use: Never   Sexual activity: Yes  Other Topics Concern   Not on file  Social History Narrative   Not on file   Social Determinants of Health   Financial Resource Strain: Not on file  Food Insecurity: Not on file  Transportation Needs: Not on file  Physical Activity: Not on file  Stress: Not on file  Social Connections: Not on file  Intimate Partner Violence: Not on file    Review of Systems:  All systems reviewed an negative except where noted in HPI.  Gen: Denies any fever, chills, sweats, anorexia, fatigue, weakness, malaise, weight loss, and sleep disorder CV: Denies chest pain, angina, palpitations, syncope, orthopnea, PND, peripheral edema, and claudication. Resp: Denies dyspnea at rest, dyspnea with exercise, cough, sputum, wheezing, coughing up blood, and pleurisy. GI: Denies vomiting blood,  jaundice, and fecal incontinence.   Denies dysphagia or odynophagia. GU : Denies urinary burning, blood in urine, urinary frequency, urinary hesitancy, nocturnal urination, and urinary incontinence. MS: Denies joint pain, limitation of movement, and swelling, stiffness, low back pain, extremity pain. Denies muscle weakness, cramps, atrophy.  Derm: Denies rash, itching, dry skin, hives, moles, warts, or unhealing ulcers.  Psych: Denies depression, anxiety, memory loss, suicidal ideation, hallucinations, paranoia, and confusion. Heme: Denies bruising, bleeding, and enlarged lymph nodes. Neuro:  Denies any headaches, dizziness, paresthesias. Endo:  Denies any problems with DM, thyroid, adrenal function.   Physical Exam: General:  Alert, well-developed, in NAD Head:  Normocephalic and  atraumatic. Eyes:  Sclera clear, no icterus.   Conjunctiva pink. Ears:  Normal auditory acuity. Mouth:  No deformity or lesions.  Neck:  Supple; no masses . Lungs:  Clear throughout to auscultation.   No wheezes, crackles, or rhonchi. No acute distress. Heart:  Regular rate and rhythm; no murmurs. Abdomen:  Soft, nondistended, nontender. No masses, hepatomegaly. No obvious masses.  Normal bowel .    Rectal:  Deferred   Msk:  Symmetrical without gross deformities.. Pulses:  Normal pulses noted. Extremities:  Without edema. Neurologic:  Alert and  oriented x4;  grossly normal neurologically. Skin:  Intact without significant lesions or rashes. Cervical Nodes:  No significant cervical adenopathy. Psych:  Alert and cooperative. Normal mood and affect.   Impression / Plan:   Personal history of adenomatous colon polyps for colonoscopy.  Last colonoscopy 5 years ago.    Pricilla Riffle. Fuller Plan  10/19/2021, 8:55 AM See Shea Evans, [facility], to contact our on call provider

## 2021-10-19 NOTE — Patient Instructions (Signed)
Please read handouts provided. Continue present medications. Await pathology results. High Fiber Diet.   YOU HAD AN ENDOSCOPIC PROCEDURE TODAY AT [facility]:   Refer to the procedure report that was given to you for any specific questions about what was found during the examination.  If the procedure report does not answer your questions, please call your gastroenterologist to clarify.  If you requested that your care partner not be given the details of your procedure findings, then the procedure report has been included in a sealed envelope for you to review at your convenience later.  YOU SHOULD EXPECT: Some feelings of bloating in the abdomen. Passage of more gas than usual.  Walking can help get rid of the air that was put into your GI tract during the procedure and reduce the bloating. If you had a lower endoscopy (such as a colonoscopy or flexible sigmoidoscopy) you may notice spotting of blood in your stool or on the toilet paper. If you underwent a bowel prep for your procedure, you may not have a normal bowel movement for a few days.  Please Note:  You might notice some irritation and congestion in your nose or some drainage.  This is from the oxygen used during your procedure.  There is no need for concern and it should clear up in a day or so.  SYMPTOMS TO REPORT IMMEDIATELY:  Following lower endoscopy (colonoscopy or flexible sigmoidoscopy):  Excessive amounts of blood in the stool  Significant tenderness or worsening of abdominal pains  Swelling of the abdomen that is new, acute  Fever of 100F or higher   For urgent or emergent issues, a gastroenterologist can be reached at any hour by calling [phone]. Do not use MyChart messaging for urgent concerns.    DIET:  We do recommend a small meal at first, but then you may proceed to your regular diet.  Drink plenty of fluids but you should avoid alcoholic beverages for 24 hours.  ACTIVITY:  You should plan  to take it easy for the rest of today and you should NOT DRIVE or use heavy machinery until tomorrow (because of the sedation medicines used during the test).    FOLLOW UP: Our staff will call the number listed on your records 48-72 hours following your procedure to check on you and address any questions or concerns that you may have regarding the information given to you following your procedure. If we do not reach you, we will leave a message.  We will attempt to reach you two times.  During this call, we will ask if you have developed any symptoms of COVID 19. If you develop any symptoms (ie: fever, flu-like symptoms, shortness of breath, cough etc.) before then, please call [phone].  If you test positive for Covid 19 in the 2 weeks post procedure, please call and report this information to [place].    If any biopsies were taken you will be contacted by phone or by letter within the next 1-3 weeks.  Please call us at [phone] if you have not heard about the biopsies in 3 weeks.    SIGNATURES/CONFIDENTIALITY: You and/or your care partner have signed paperwork which will be entered into your electronic medical record.  These signatures attest to the fact that that the information above on your After Visit Summary has been reviewed and is understood.  Full responsibility of the confidentiality of this discharge information lies with you and/or your care-partner.

## 2021-10-21 ENCOUNTER — Telehealth: Payer: Self-pay

## 2021-10-21 ENCOUNTER — Encounter: Payer: Medicare PPO | Admitting: Gastroenterology

## 2021-10-21 NOTE — Telephone Encounter (Signed)
  Follow up Call-  Call back number 10/19/2021  Post procedure Call Back phone  # [phone] cell  Permission to leave phone message Yes  Some recent data might be hidden     Patient questions:  Do you have a fever, pain , or abdominal swelling? No. Pain Score  0 *  Have you tolerated food without any problems? Yes.    Have you been able to return to your normal activities? Yes.    Do you have any questions about your discharge instructions: Diet   No. Medications  No. Follow up visit  No.  Do you have questions or concerns about your Care? No.  Actions: * If pain score is 4 or above: No action needed, pain <4.  Have you developed a fever since your procedure? no  2.   Have you had an respiratory symptoms (SOB or cough) since your procedure? no  3.   Have you tested positive for COVID 19 since your procedure no  4.   Have you had any family members/close contacts diagnosed with the COVID 19 since your procedure?  no   If yes to any of these questions please route to Joylene John, RN and Joella Prince, RN

## 2021-10-26 ENCOUNTER — Encounter: Payer: Self-pay | Admitting: Gastroenterology

## 2021-11-11 DIAGNOSIS — C4402 Squamous cell carcinoma of skin of lip: Secondary | ICD-10-CM | POA: Diagnosis not present

## 2021-11-18 ENCOUNTER — Other Ambulatory Visit: Payer: Self-pay

## 2021-11-18 ENCOUNTER — Encounter: Payer: Self-pay | Admitting: Family Medicine

## 2021-11-18 ENCOUNTER — Ambulatory Visit (INDEPENDENT_AMBULATORY_CARE_PROVIDER_SITE_OTHER): Payer: Medicare PPO | Admitting: Family Medicine

## 2021-11-18 VITALS — BP 126/74 | HR 62 | Temp 98.3°F | Ht 70.0 in | Wt 195.0 lb

## 2021-11-18 DIAGNOSIS — Z23 Encounter for immunization: Secondary | ICD-10-CM

## 2021-11-18 DIAGNOSIS — E785 Hyperlipidemia, unspecified: Secondary | ICD-10-CM | POA: Diagnosis not present

## 2021-11-18 DIAGNOSIS — M109 Gout, unspecified: Secondary | ICD-10-CM

## 2021-11-18 DIAGNOSIS — Z6827 Body mass index (BMI) 27.0-27.9, adult: Secondary | ICD-10-CM

## 2021-11-18 DIAGNOSIS — R739 Hyperglycemia, unspecified: Secondary | ICD-10-CM

## 2021-11-18 DIAGNOSIS — N400 Enlarged prostate without lower urinary tract symptoms: Secondary | ICD-10-CM | POA: Diagnosis not present

## 2021-11-18 DIAGNOSIS — Z0001 Encounter for general adult medical examination with abnormal findings: Secondary | ICD-10-CM

## 2021-11-18 LAB — HEMOGLOBIN A1C: Hgb A1c MFr Bld: 5.4 % (ref 4.6–6.5)

## 2021-11-18 LAB — LIPID PANEL
Cholesterol: 195 mg/dL (ref 0–200)
HDL: 39.8 mg/dL (ref >39.00)
LDL Cholesterol: 128 mg/dL — ABNORMAL HIGH (ref 0–99)
NonHDL: 155.47
Total CHOL/HDL Ratio: 5
Triglycerides: 139 mg/dL (ref 0.0–149.0)
VLDL: 27.8 mg/dL (ref 0.0–40.0)

## 2021-11-18 LAB — COMPREHENSIVE METABOLIC PANEL
ALT: 22 U/L (ref 0–53)
AST: 18 U/L (ref 0–37)
Albumin: 4.5 g/dL (ref 3.5–5.2)
Alkaline Phosphatase: 61 U/L (ref 39–117)
BUN: 12 mg/dL (ref 6–23)
CO2: 24 mEq/L (ref 19–32)
Calcium: 9.8 mg/dL (ref 8.4–10.5)
Chloride: 103 mEq/L (ref 96–112)
Creatinine, Ser: 0.95 mg/dL (ref 0.40–1.50)
GFR: 80.87 mL/min (ref >60.00)
Glucose, Bld: 80 mg/dL (ref 70–99)
Potassium: 4.7 mEq/L (ref 3.5–5.1)
Sodium: 136 mEq/L (ref 135–145)
Total Bilirubin: 0.7 mg/dL (ref 0.2–1.2)
Total Protein: 7 g/dL (ref 6.0–8.3)

## 2021-11-18 LAB — CBC
HCT: 44.6 % (ref 39.0–52.0)
Hemoglobin: 15.3 g/dL (ref 13.0–17.0)
MCHC: 34.2 g/dL (ref 30.0–36.0)
MCV: 92.4 fl (ref 78.0–100.0)
Platelets: 241 10*3/uL (ref 150.0–400.0)
RBC: 4.83 Mil/uL (ref 4.22–5.81)
RDW: 13.2 % (ref 11.5–15.5)
WBC: 5.7 10*3/uL (ref 4.0–10.5)

## 2021-11-18 LAB — URIC ACID: Uric Acid, Serum: 8.6 mg/dL — ABNORMAL HIGH (ref 4.0–7.8)

## 2021-11-18 LAB — TSH: TSH: 1.98 u[IU]/mL (ref 0.35–5.50)

## 2021-11-18 LAB — PSA: PSA: 4.07 ng/mL — ABNORMAL HIGH (ref 0.10–4.00)

## 2021-11-18 MED ORDER — SILDENAFIL CITRATE 20 MG PO TABS: ORAL_TABLET | ORAL | 9 refills | Status: DC

## 2021-11-18 NOTE — Assessment & Plan Note (Signed)
Check lipids.  Continue lifestyle modifications. 

## 2021-11-18 NOTE — Assessment & Plan Note (Signed)
Stable.  Check PSA. 

## 2021-11-18 NOTE — Assessment & Plan Note (Signed)
Check uric acid level.  No recent flares. 

## 2021-11-18 NOTE — Addendum Note (Signed)
Addended by: Cranston Neighbor on: 11/18/2021 02:49 PM   Modules accepted: Orders

## 2021-11-18 NOTE — Patient Instructions (Signed)
It was very nice to see you today!  We will check blood work today.  Please continue the good work with your diet and exercise.  We will give you your shingles vaccine today.  Please come back in 1 year for your next physical.  Come back sooner if needed.  Take care, Dr Jerline Pain  PLEASE NOTE:  If you had any lab tests please let us know if you have not heard back within a few days. You may see your results on mychart before we have a chance to review them but we will give you a call once they are reviewed by [place]. If we ordered any referrals today, please let us know if you have not heard from their office within the next week.   Please try these tips to maintain a healthy lifestyle:  Eat at least 3 REAL meals and 1-2 snacks per day.  Aim for no more than 5 hours between eating.  If you eat breakfast, please do so within one hour of getting up.   Each meal should contain half fruits/vegetables, one quarter protein, and one quarter carbs (no bigger than a computer mouse)  Cut down on sweet beverages. This includes juice, soda, and sweet tea.   Drink at least 1 glass of water with each meal and aim for at least 8 glasses per day  Exercise at least 150 minutes every week.    Preventive Care 95 Years and Older, Male Preventive care refers to lifestyle choices and visits with your health care provider that can promote health and wellness. Preventive care visits are also called wellness exams. What can I expect for my preventive care visit? Counseling During your preventive care visit, your health care provider may ask about your: Medical history, including: Past medical problems. Family medical history. History of falls. Current health, including: Emotional well-being. Home life and relationship well-being. Sexual activity. Memory and ability to understand (cognition). Lifestyle, including: Alcohol, nicotine or tobacco, and drug use. Access to firearms. Diet, exercise, and sleep  habits. Work and work Statistician. Sunscreen use. Safety issues such as seatbelt and bike helmet use. Physical exam Your health care provider will check your: Height and weight. These may be used to calculate your BMI (body mass index). BMI is a measurement that tells if you are at a healthy weight. Waist circumference. This measures the distance around your waistline. This measurement also tells if you are at a healthy weight and may help predict your risk of certain diseases, such as type 2 diabetes and high blood pressure. Heart rate and blood pressure. Body temperature. Skin for abnormal spots. What immunizations do I need? Vaccines are usually given at various ages, according to a schedule. Your health care provider will recommend vaccines for you based on your age, medical history, and lifestyle or other factors, such as travel or where you work. What tests do I need? Screening Your health care provider may recommend screening tests for certain conditions. This may include: Lipid and cholesterol levels. Diabetes screening. This is done by checking your blood sugar (glucose) after you have not eaten for a while (fasting). Hepatitis C test. Hepatitis B test. HIV (human immunodeficiency virus) test. STI (sexually transmitted infection) testing, if you are at risk. Lung cancer screening. Colorectal cancer screening. Prostate cancer screening. Abdominal aortic aneurysm (AAA) screening. You may need this if you are a current or former smoker. Talk with your health care provider about your test results, treatment options, and if necessary, the need  for more tests. Follow these instructions at home: Eating and drinking  Eat a diet that includes fresh fruits and vegetables, whole grains, lean protein, and low-fat dairy products. Limit your intake of foods with high amounts of sugar, saturated fats, and salt. Take vitamin and mineral supplements as recommended by your health care  provider. Do not drink alcohol if your health care provider tells you not to drink. If you drink alcohol: Limit how much you have to 0-2 drinks a day. Know how much alcohol is in your drink. In the [place] one drink equals one 12 oz bottle of beer (355 mL), one 5 oz glass of wine (148 mL), or one 1 oz glass of hard liquor (44 mL). Lifestyle Brush your teeth every morning and night with fluoride toothpaste. Floss one time each day. Exercise for at least 30 minutes 5 or more days each week. Do not use any products that contain nicotine or tobacco. These products include cigarettes, chewing tobacco, and vaping devices, such as e-cigarettes. If you need help quitting, ask your health care provider. Do not use drugs. If you are sexually active, practice safe sex. Use a condom or other form of protection to prevent STIs. Take aspirin only as told by your health care provider. Make sure that you understand how much to take and what form to take. Work with your health care provider to find out whether it is safe and beneficial for you to take aspirin daily. Ask your health care provider if you need to take a cholesterol-lowering medicine (statin). Find healthy ways to manage stress, such as: Meditation, yoga, or listening to music. Journaling. Talking to a trusted person. Spending time with friends and family. Safety Always wear your seat belt while driving or riding in a vehicle. Do not drive: If you have been drinking alcohol. Do not ride with someone who has been drinking. When you are tired or distracted. While texting. If you have been using any mind-altering substances or drugs. Wear a helmet and other protective equipment during sports activities. If you have firearms in your house, make sure you follow all gun safety procedures. Minimize exposure to UV radiation to reduce your risk of skin cancer. What's next? Visit your health care provider once a year for an annual wellness visit. Ask  your health care provider how often you should have your eyes and teeth checked. Stay up to date on all vaccines. This information is not intended to replace advice given to you by your health care provider. Make sure you discuss any questions you have with your health care provider. Document Revised: 05/06/2021 Document Reviewed: 05/06/2021 Elsevier Patient Education  [place].

## 2021-11-18 NOTE — Progress Notes (Signed)
Chief Complaint:  Troy [name] is a 70 y.o. male who presents today for his annual comprehensive physical exam.    Assessment/Plan:  Chronic Problems Addressed Today: Dyslipidemia Check lipids.  Continue lifestyle modifications.  Benign prostatic hyperplasia Stable. Check PSA.   Gout Check uric acid level. No recent flares.    Body mass index is 27.98 kg/m. / Overweight  BMI Metric Follow Up - 11/18/21 1021       BMI Metric Follow Up-Please document annually   BMI Metric Follow Up Education provided             Preventative Healthcare: Check labs.  Shingrix given.  Up-to-date on colon cancer screening.  Patient Counseling(The following topics were reviewed and/or handout was given):  -Nutrition: Stressed importance of moderation in sodium/caffeine intake, saturated fat and cholesterol, caloric balance, sufficient intake of fresh fruits, vegetables, and fiber.  -Stressed the importance of regular exercise.   -Substance Abuse: Discussed cessation/primary prevention of tobacco, alcohol, or other drug use; driving or other dangerous activities under the influence; availability of treatment for abuse.   -Injury prevention: Discussed safety belts, safety helmets, smoke detector, smoking near bedding or upholstery.   -Sexuality: Discussed sexually transmitted diseases, partner selection, use of condoms, avoidance of unintended pregnancy and contraceptive alternatives.   -Dental health: Discussed importance of regular tooth brushing, flossing, and dental visits.  -Health maintenance and immunizations reviewed. Please refer to Health maintenance section.  Return to care in 1 year for next preventative visit.     Subjective:  HPI:  He has no acute complaints today.   Lifestyle Diet: Balanced. Plenty of fruits and vegetables.  Exercise: Cardio every other day.   Depression screen PHQ 2/9 11/18/2021  Decreased Interest 0  Down, Depressed, Hopeless 0  PHQ - 2 Score 0    Health Maintenance Due  Topic Date Due   Zoster Vaccines- Shingrix (1 of 2) Never done   COVID-19 Vaccine (5 - Booster for Pfizer series) 05/11/2021    ROS: Per HPI, otherwise a complete review of systems was negative.   PMH:  The following were reviewed and entered/updated in epic: Past Medical History:  Diagnosis Date   BPH (benign prostatic hyperplasia)    Patient Active Problem List   Diagnosis Date Noted   Degenerative disc disease, cervical 05/15/2020   Benign prostatic hyperplasia 05/22/2019   Erectile dysfunction 05/22/2019   Gout 01/08/2019   Degenerative disc disease, lumbar 12/11/2018   Nonallopathic lesion of sacral region 12/11/2018   Nonallopathic lesion of lumbar region 12/11/2018   Nonallopathic lesion of thoracic region 12/11/2018   Dyslipidemia 08/20/2014   Osteoarthritis 07/15/2009   Past Surgical History:  Procedure Laterality Date   COLONOSCOPY  11/24/2005   STARK-TICS ONLY   EYE SURGERY Bilateral 11/23/2003   lasik   JOINT REPLACEMENT Left 11/22/2005   Lt THR - Dr Ronnie Derby   POLYPECTOMY  2017   TA x 2    Family History  Problem Relation Age of Onset   Heart disease Father    Cancer Brother        Lung Cancer   Colon cancer Neg Hx    Colon polyps Neg Hx    Esophageal cancer Neg Hx    Rectal cancer Neg Hx    Stomach cancer Neg Hx    Prostate cancer Neg Hx     Medications- reviewed and updated Current Outpatient Medications  Medication Sig Dispense Refill   ibuprofen (ADVIL) 100 MG tablet Take by mouth.  Multiple Vitamin (MULTIVITAMIN) capsule Take by mouth.     sildenafil (REVATIO) 20 MG tablet Take 2 to 5 tablets Daily as needed 90 tablet 9   No current facility-administered medications for this visit.    Allergies-reviewed and updated No Known Allergies  Social History   Socioeconomic History   Marital status: Married    Spouse name: Not on file   Number of children: Not on file   Years of education: Not on file    Highest education level: Not on file  Occupational History   Not on file  Tobacco Use   Smoking status: Never   Smokeless tobacco: Never  Vaping Use   Vaping Use: Never used  Substance and Sexual Activity   Alcohol use: Yes    Comment: rarely   Drug use: Never   Sexual activity: Yes  Other Topics Concern   Not on file  Social History Narrative   Not on file   Social Determinants of Health   Financial Resource Strain: Not on file  Food Insecurity: Not on file  Transportation Needs: Not on file  Physical Activity: Not on file  Stress: Not on file  Social Connections: Not on file        Objective:  Physical Exam: BP 126/74 (BP Location: Right Arm)    Pulse 62    Temp 98.3 F (36.8 C) (Temporal)    Ht 5\' 10"  (1.778 m)    Wt 195 lb (88.5 kg)    SpO2 95%    BMI 27.98 kg/m   Body mass index is 27.98 kg/m. Wt Readings from Last 3 Encounters:  11/18/21 195 lb (88.5 kg)  10/19/21 200 lb (90.7 kg)  10/07/21 200 lb (90.7 kg)   Gen: NAD, resting comfortably HEENT: TMs normal bilaterally. OP clear. No thyromegaly noted.  CV: RRR with no murmurs appreciated Pulm: NWOB, CTAB with no crackles, wheezes, or rhonchi GI: Normal bowel sounds present. Soft, Nontender, Nondistended. MSK: no edema, cyanosis, or clubbing noted Skin: warm, dry Neuro: CN2-12 grossly intact. Strength 5/5 in upper and lower extremities. Reflexes symmetric and intact bilaterally.  Psych: Normal affect and thought content     Clearance Chenault M. Jerline Pain, MD 11/18/2021 10:23 AM

## 2021-11-19 NOTE — Progress Notes (Signed)
Please inform patient of the following:  His PSA is in the borderline range and seems to be trending upward.  Recommend referral to urology.  His uric acid level is elevated but if he has not had any recent flares do not need to make any medication changes at this point.   His "bad" cholesterol is a little elevated.  Recommend starting cholesterol medication to lower numbers and reduce risk of heart attack and stroke. Please send in atorvastatin 40mg  daily if he is willing to start. regardless he should continue working on diet and exercise and we can recheck in year.  Everything else is NORMAL.  Troy [name]. Jerline Pain, MD 11/19/2021 1:45 PM

## 2021-11-23 ENCOUNTER — Encounter: Payer: Self-pay | Admitting: Family Medicine

## 2021-11-25 ENCOUNTER — Encounter: Payer: Self-pay | Admitting: Family Medicine

## 2021-11-25 ENCOUNTER — Encounter: Payer: Self-pay | Admitting: Gastroenterology

## 2021-11-26 NOTE — Telephone Encounter (Signed)
Please schedule patient with Dr Parker  °

## 2021-11-26 NOTE — Telephone Encounter (Signed)
Patient is scheduled for a virtual on 11/27/21.

## 2021-11-26 NOTE — Telephone Encounter (Signed)
See note

## 2021-11-27 ENCOUNTER — Encounter: Payer: Self-pay | Admitting: Family Medicine

## 2021-11-27 ENCOUNTER — Telehealth: Payer: Medicare PPO | Admitting: Family Medicine

## 2021-11-27 ENCOUNTER — Other Ambulatory Visit: Payer: Self-pay

## 2021-11-27 DIAGNOSIS — E785 Hyperlipidemia, unspecified: Secondary | ICD-10-CM

## 2021-11-27 DIAGNOSIS — N400 Enlarged prostate without lower urinary tract symptoms: Secondary | ICD-10-CM | POA: Diagnosis not present

## 2021-11-27 DIAGNOSIS — M109 Gout, unspecified: Secondary | ICD-10-CM

## 2021-11-27 MED ORDER — FINASTERIDE 5 MG PO TABS: 5.0000 mg | ORAL_TABLET | Freq: Every day | ORAL | 3 refills | Status: DC

## 2021-11-27 MED ORDER — ATORVASTATIN CALCIUM 10 MG PO TABS: 10.0000 mg | ORAL_TABLET | Freq: Every day | ORAL | 3 refills | Status: DC

## 2021-11-27 NOTE — Assessment & Plan Note (Signed)
Recent PSA elevated.  We discussed referral to urology however they would like to defer for now.  We will restart finasteride 5 mg daily.  He has done well with this in the past.  Follow-up to recheck PSA in 3 to 6 months.

## 2021-11-27 NOTE — Assessment & Plan Note (Signed)
Discussed recent lipid panel.  10-year ASCVD 19.2%.  We discussed statin therapy.  He has some issues with high intensity Crestor in the past.  We will start Lipitor 10 mg daily.  He will let me know if he has any bothersome side effects and we can reduce frequency of dose as needed.  He will follow-up in 6 months to recheck lipid panel.

## 2021-11-27 NOTE — Assessment & Plan Note (Signed)
No recent gout flares.  His uric acid level was elevated however we will defer starting allopurinol again due to recent flares.

## 2021-11-27 NOTE — Progress Notes (Signed)
° °  Troy [name] is a 71 y.o. male who presents today for a virtual office visit.  Assessment/Plan:  Chronic Problems Addressed Today: Dyslipidemia Discussed recent lipid panel.  10-year ASCVD 19.2%.  We discussed statin therapy.  He has some issues with high intensity Crestor in the past.  We will start Lipitor 10 mg daily.  He will let me know if he has any bothersome side effects and we can reduce frequency of dose as needed.  He will follow-up in 6 months to recheck lipid panel.  Benign prostatic hyperplasia Recent PSA elevated.  We discussed referral to urology however they would like to defer for now.  We will restart finasteride 5 mg daily.  He has done well with this in the past.  Follow-up to recheck PSA in 3 to 6 months.  Gout No recent gout flares.  His uric acid level was elevated however we will defer starting allopurinol again due to recent flares.     Subjective:  HPI:  See A/P for status of chronic conditions.  Patient with his wife.  They would like to discuss recent labs.  Labs recently notable for elevated LDL, uric acid, PSA.  Has been on Crestor in the past.  Had to stop due to brain fog and other side effects.  Is also had issues with elevated PSA in the past.  Has previously seen urology with negative biopsy.  He was started on finasteride which he has done well with in the past but has not been on for several years.        Objective/Observations  Physical Exam: Gen: NAD, resting comfortably Pulm: Normal work of breathing Neuro: Grossly normal, moves all extremities Psych: Normal affect and thought content  Virtual Visit via Video   I connected with Troy [name] on 11/27/21 at  3:40 PM EST by a video enabled telemedicine application and verified that I am speaking with the correct person using two identifiers. The limitations of evaluation and management by telemedicine and the availability of in person appointments were discussed. The patient expressed  understanding and agreed to proceed.   Patient location: Home Provider location: [place] participating in the virtual visit: Myself, Patient and his wife     Troy [name]. Troy Pain, MD 11/27/2021 4:23 PM

## 2021-12-08 ENCOUNTER — Other Ambulatory Visit: Payer: Self-pay

## 2021-12-08 ENCOUNTER — Ambulatory Visit (INDEPENDENT_AMBULATORY_CARE_PROVIDER_SITE_OTHER): Payer: Medicare PPO

## 2021-12-08 DIAGNOSIS — Z0000 Encounter for general adult medical examination without abnormal findings: Secondary | ICD-10-CM

## 2021-12-08 NOTE — Patient Instructions (Signed)
[name] , Thank you for taking time to come for your Medicare Wellness Visit. I appreciate your ongoing commitment to your health goals. Please review the following plan we discussed and let me know if I can assist you in the future.   Screening recommendations/referrals: Colonoscopy: Done 10/19/21 repeat every  year  Recommended yearly ophthalmology/optometry visit for glaucoma screening and checkup Recommended yearly dental visit for hygiene and checkup  Vaccinations: Influenza vaccine: Done 10/26/21 repeat every year  Pneumococcal vaccine: Up to date Tdap vaccine: Done 11/05/19 repeat every 10 years  Shingles vaccine: 1st dose 11/18/21   Covid-19: Completed 2/6, 3/3, 08/22/20 & 03/16/21  Advanced directives: Please bring a copy of your health care power of attorney and living will to the office at your convenience.  Conditions/risks identified: Continue to exercise and eat smart   Next appointment: Follow up in one year for your annual wellness visit.   Preventive Care 71 Years and Older, Male Preventive care refers to lifestyle choices and visits with your health care provider that can promote health and wellness. What does preventive care include? A yearly physical exam. This is also called an annual well check. Dental exams once or twice a year. Routine eye exams. Ask your health care provider how often you should have your eyes checked. Personal lifestyle choices, including: Daily care of your teeth and gums. Regular physical activity. Eating a healthy diet. Avoiding tobacco and drug use. Limiting alcohol use. Practicing safe sex. Taking low doses of aspirin every day. Taking vitamin and mineral supplements as recommended by your health care provider. What happens during an annual well check? The services and screenings done by your health care provider during your annual well check will depend on your age, overall health, lifestyle risk factors, and family history of  disease. Counseling  Your health care provider may ask you questions about your: Alcohol use. Tobacco use. Drug use. Emotional well-being. Home and relationship well-being. Sexual activity. Eating habits. History of falls. Memory and ability to understand (cognition). Work and work Statistician. Screening  You may have the following tests or measurements: Height, weight, and BMI. Blood pressure. Lipid and cholesterol levels. These may be checked every 5 years, or more frequently if you are over 29 years old. Skin check. Lung cancer screening. You may have this screening every year starting at age 56 if you have a 30-pack-year history of smoking and currently smoke or have quit within the past 15 years. Fecal occult blood test (FOBT) of the stool. You may have this test every year starting at age 67. Flexible sigmoidoscopy or colonoscopy. You may have a sigmoidoscopy every 5 years or a colonoscopy every 10 years starting at age 41. Prostate cancer screening. Recommendations will vary depending on your family history and other risks. Hepatitis C blood test. Hepatitis B blood test. Sexually transmitted disease (STD) testing. Diabetes screening. This is done by checking your blood sugar (glucose) after you have not eaten for a while (fasting). You may have this done every 1-3 years. Abdominal aortic aneurysm (AAA) screening. You may need this if you are a current or former smoker. Osteoporosis. You may be screened starting at age 58 if you are at high risk. Talk with your health care provider about your test results, treatment options, and if necessary, the need for more tests. Vaccines  Your health care provider may recommend certain vaccines, such as: Influenza vaccine. This is recommended every year. Tetanus, diphtheria, and acellular pertussis (Tdap, Td) vaccine. You may  need a Td booster every 10 years. Zoster vaccine. You may need this after age 80. Pneumococcal 13-valent  conjugate (PCV13) vaccine. One dose is recommended after age 59. Pneumococcal polysaccharide (PPSV23) vaccine. One dose is recommended after age 58. Talk to your health care provider about which screenings and vaccines you need and how often you need them. This information is not intended to replace advice given to you by your health care provider. Make sure you discuss any questions you have with your health care provider. Document Released: 12/05/2015 Document Revised: 07/28/2016 Document Reviewed: 09/09/2015 Elsevier Interactive Patient Education  2017 [employer] Prevention in the Home Falls can cause injuries. They can happen to people of all ages. There are many things you can do to make your home safe and to help prevent falls. What can I do on the outside of my home? Regularly fix the edges of walkways and driveways and fix any cracks. Remove anything that might make you trip as you walk through a door, such as a raised step or threshold. Trim any bushes or trees on the path to your home. Use bright outdoor lighting. Clear any walking paths of anything that might make someone trip, such as rocks or tools. Regularly check to see if handrails are loose or broken. Make sure that both sides of any steps have handrails. Any raised decks and porches should have guardrails on the edges. Have any leaves, snow, or ice cleared regularly. Use sand or salt on walking paths during winter. Clean up any spills in your garage right away. This includes oil or grease spills. What can I do in the bathroom? Use night lights. Install grab bars by the toilet and in the tub and shower. Do not use towel bars as grab bars. Use non-skid mats or decals in the tub or shower. If you need to sit down in the shower, use a plastic, non-slip stool. Keep the floor dry. Clean up any water that spills on the floor as soon as it happens. Remove soap buildup in the tub or shower regularly. Attach bath mats  securely with double-sided non-slip rug tape. Do not have throw rugs and other things on the floor that can make you trip. What can I do in the bedroom? Use night lights. Make sure that you have a light by your bed that is easy to reach. Do not use any sheets or blankets that are too big for your bed. They should not hang down onto the floor. Have a firm chair that has side arms. You can use this for support while you get dressed. Do not have throw rugs and other things on the floor that can make you trip. What can I do in the kitchen? Clean up any spills right away. Avoid walking on wet floors. Keep items that you use a lot in easy-to-reach places. If you need to reach something above you, use a strong step stool that has a grab bar. Keep electrical cords out of the way. Do not use floor polish or wax that makes floors slippery. If you must use wax, use non-skid floor wax. Do not have throw rugs and other things on the floor that can make you trip. What can I do with my stairs? Do not leave any items on the stairs. Make sure that there are handrails on both sides of the stairs and use them. Fix handrails that are broken or loose. Make sure that handrails are as long as the stairways.  Check any carpeting to make sure that it is firmly attached to the stairs. Fix any carpet that is loose or worn. Avoid having throw rugs at the top or bottom of the stairs. If you do have throw rugs, attach them to the floor with carpet tape. Make sure that you have a light switch at the top of the stairs and the bottom of the stairs. If you do not have them, ask someone to add them for you. What else can I do to help prevent falls? Wear shoes that: Do not have high heels. Have rubber bottoms. Are comfortable and fit you well. Are closed at the toe. Do not wear sandals. If you use a stepladder: Make sure that it is fully opened. Do not climb a closed stepladder. Make sure that both sides of the stepladder  are locked into place. Ask someone to hold it for you, if possible. Clearly mark and make sure that you can see: Any grab bars or handrails. First and last steps. Where the edge of each step is. Use tools that help you move around (mobility aids) if they are needed. These include: Canes. Walkers. Scooters. Crutches. Turn on the lights when you go into a dark area. Replace any light bulbs as soon as they burn out. Set up your furniture so you have a clear path. Avoid moving your furniture around. If any of your floors are uneven, fix them. If there are any pets around you, be aware of where they are. Review your medicines with your doctor. Some medicines can make you feel dizzy. This can increase your chance of falling. Ask your doctor what other things that you can do to help prevent falls. This information is not intended to replace advice given to you by your health care provider. Make sure you discuss any questions you have with your health care provider. Document Released: 09/04/2009 Document Revised: 04/15/2016 Document Reviewed: 12/13/2014 Elsevier Interactive Patient Education  2017 [employer].

## 2021-12-08 NOTE — Progress Notes (Addendum)
Virtual Visit via Telephone Note  I connected with  Troy [name] on 12/08/21 at  8:00 AM EST by telephone and verified that I am speaking with the correct person using two identifiers.  Medicare Annual Wellness visit completed telephonically due to Covid-19 pandemic.   Persons participating in this call: This Health Coach and this patient.   Location: Patient: Home Provider: Office   I discussed the limitations, risks, security and privacy concerns of performing an evaluation and management service by telephone and the availability of in person appointments. The patient expressed understanding and agreed to proceed.  Unable to perform video visit due to video visit attempted and failed and/or patient does not have video capability.   Some vital signs may be absent or patient reported.   Willette Brace, LPN   Subjective:   Troy [name] is a 71 y.o. male who presents for Medicare Annual/Subsequent preventive examination.  Review of Systems     Cardiac Risk Factors include: advanced age (>58men, >11 women);dyslipidemia;male gender     Objective:    There were no vitals filed for this visit. There is no height or weight on file to calculate BMI.  Advanced Directives 12/08/2021 06/15/2021 12/24/2019 10/04/2016 12/29/2015 12/22/2015  Does Patient Have a Medical Advance Directive? Yes No No Yes Yes Yes  Type of Advance Directive [place];Living will [place];Living will [place];Living will  Does patient want to make changes to medical advance directive? - - - Yes - information given - -  Copy of [place] in Chart? No - copy requested - - No - copy requested - -  Would patient like information on creating a medical advance directive? - - No - Patient declined - - -    Current Medications (verified) Outpatient Encounter Medications as of 12/08/2021  Medication Sig    atorvastatin (LIPITOR) 10 MG tablet Take 1 tablet (10 mg total) by mouth daily.   finasteride (PROSCAR) 5 MG tablet Take 1 tablet (5 mg total) by mouth daily.   ibuprofen (ADVIL) 100 MG tablet Take by mouth.   Multiple Vitamin (MULTIVITAMIN) capsule Take by mouth.   sildenafil (REVATIO) 20 MG tablet Take 2 to 5 tablets Daily as needed   No facility-administered encounter medications on file as of 12/08/2021.    Allergies (verified) Patient has no known allergies.   History: Past Medical History:  Diagnosis Date   BPH (benign prostatic hyperplasia)    Past Surgical History:  Procedure Laterality Date   COLONOSCOPY  11/24/2005   STARK-TICS ONLY   EYE SURGERY Bilateral 11/23/2003   lasik   JOINT REPLACEMENT Left 11/22/2005   Lt THR - Dr Ronnie Derby   POLYPECTOMY  2017   TA x 2   Family History  Problem Relation Age of Onset   Heart disease Father    Cancer Brother        Lung Cancer   Colon cancer Neg Hx    Colon polyps Neg Hx    Esophageal cancer Neg Hx    Rectal cancer Neg Hx    Stomach cancer Neg Hx    Prostate cancer Neg Hx    Social History   Socioeconomic History   Marital status: Married    Spouse name: Not on file   Number of children: Not on file   Years of education: Not on file   Highest education level: Not on file  Occupational  History   Not on file  Tobacco Use   Smoking status: Never   Smokeless tobacco: Never  Vaping Use   Vaping Use: Never used  Substance and Sexual Activity   Alcohol use: Yes    Comment: rarely   Drug use: Never   Sexual activity: Yes  Other Topics Concern   Not on file  Social History Narrative   Not on file   Social Determinants of Health   Financial Resource Strain: Low Risk    Difficulty of Paying Living Expenses: Not hard at all  Food Insecurity: No Food Insecurity   Worried About Charity fundraiser in the Last Year: Never true   [place] in the Last Year: Never true  Transportation Needs: No Transportation  Needs   Lack of Transportation (Medical): No   Lack of Transportation (Non-Medical): No  Physical Activity: Sufficiently Active   Days of Exercise per Week: 7 days   Minutes of Exercise per Session: 60 min  Stress: No Stress Concern Present   Feeling of Stress : Not at all  Social Connections: Moderately Integrated   Frequency of Communication with Friends and Family: More than three times a week   Frequency of Social Gatherings with Friends and Family: More than three times a week   Attends Religious Services: Never   Marine scientist or Organizations: Yes   Attends Music therapist: 1 to 4 times per year   Marital Status: Married    Tobacco Counseling Counseling given: Not Answered   Clinical Intake:  Pre-visit preparation completed: Yes  Pain : No/denies pain     BMI - recorded: 27.98 Nutritional Status: BMI 25 -29 Overweight Nutritional Risks: None Diabetes: No  How often do you need to have someone help you when you read instructions, pamphlets, or other written materials from your doctor or pharmacy?: 1 - Never  Diabetic?No  Interpreter Needed?: No  Information entered by :: Tinas Alvin Diffee, LPN   Activities of Daily Living In your present state of health, do you have any difficulty performing the following activities: 12/08/2021  Hearing? N  Vision? N  Difficulty concentrating or making decisions? N  Walking or climbing stairs? N  Dressing or bathing? N  Doing errands, shopping? N  Preparing Food and eating ? N  Using the Toilet? N  In the past six months, have you accidently leaked urine? N  Do you have problems with loss of bowel control? N  Managing your Medications? N  Managing your Finances? N  Housekeeping or managing your Housekeeping? N  Some recent data might be hidden    Patient Care Team: Vivi Barrack, MD as PCP - General (Family Medicine)  Indicate any recent Medical Services you may have received from other than  [facility] providers in the past year (date may be approximate).     Assessment:   This is a routine wellness examination for [name].  Hearing/Vision screen Hearing Screening - Comments:: Pt denies any hearing  Vision Screening - Comments:: Pt follows up with Dr Darrel Reach for annual eye exams   Dietary issues and exercise activities discussed: Current Exercise Habits: Home exercise routine, Type of exercise: treadmill;walking;Other - see comments, Time (Minutes): 60, Frequency (Times/Week): 7, Weekly Exercise (Minutes/Week): 420   Goals Addressed             This Visit's Progress    Patient Stated       Keep exercising and eating smart  Depression Screen PHQ 2/9 Scores 12/08/2021 11/18/2021 11/12/2020 11/05/2019 05/22/2019 11/05/2018 10/20/2017  PHQ - 2 Score 0 0 0 0 0 0 0    Fall Risk Fall Risk  12/08/2021 11/18/2021 11/12/2020 11/05/2019 11/05/2018  Falls in the past year? 0 0 0 0 0  Number falls in past yr: 0 0 - - -  Injury with Fall? 0 0 - - -  Follow up Falls prevention discussed - - - -    FALL RISK PREVENTION PERTAINING TO THE HOME:  Any stairs in or around the home? Yes  If so, are there any without handrails? No  Home free of loose throw rugs in walkways, pet beds, electrical cords, etc? Yes  Adequate lighting in your home to reduce risk of falls? Yes   ASSISTIVE DEVICES UTILIZED TO PREVENT FALLS:  Life alert? No  Use of a cane, walker or w/c? No  Grab bars in the bathroom? No  Shower chair or bench in shower? Yes  Elevated toilet seat or a handicapped toilet? No   TIMED UP AND GO:  Was the test performed? No .  Cognitive Function:     6CIT Screen 12/08/2021  What Year? 0 points  What month? 0 points  What time? 0 points  Count back from 20 0 points  Months in reverse 0 points  Repeat phrase 0 points  Total Score 0    Immunizations Immunization History  Administered Date(s) Administered   Fluad Quad(high Dose 65+) 08/16/2020   Influenza  Split 10/30/2012, 08/20/2014, 08/25/2015   Influenza Whole 10/22/2005, 07/19/2010, 08/04/2011   Influenza, High Dose Seasonal PF 09/20/2016, 10/20/2017, 08/30/2018, 08/06/2019   Influenza-Unspecified 09/06/2018, 10/26/2021   PFIZER(Purple Top)SARS-COV-2 Vaccination 12/29/2019, 01/23/2020, 08/22/2020, 03/16/2021   PPD Test 08/20/2014, 08/25/2015   Pneumococcal Conjugate-13 10/04/2016   Pneumococcal Polysaccharide-23 10/31/2018   Td 11/22/1998, 07/15/2009   Tdap 11/05/2019   Zoster Recombinat (Shingrix) 11/18/2021   Zoster, Live 08/14/2013    TDAP status: Up to date  Flu Vaccine status: Up to date  Pneumococcal vaccine status: Up to date  Covid-19 vaccine status: Completed vaccines  Qualifies for Shingles Vaccine? Yes   Zostavax completed Yes   Shingrix Completed?: Yes  Screening Tests Health Maintenance  Topic Date Due   COVID-19 Vaccine (5 - Booster for Pfizer series) 05/11/2021   Zoster Vaccines- Shingrix (2 of 2) 01/13/2022   COLONOSCOPY (Pts 45-53yrs Insurance coverage will need to be confirmed)  10/19/2028   TETANUS/TDAP  11/04/2029   Pneumonia Vaccine 76+ Years old  Completed   INFLUENZA VACCINE  Completed   Hepatitis C Screening  Completed   HPV VACCINES  Aged Out    Health Maintenance  Health Maintenance Due  Topic Date Due   COVID-19 Vaccine (5 - Booster for [place] series) 05/11/2021    Colorectal cancer screening: Type of screening: Colonoscopy. Completed 10/19/21. Repeat every 7 years  Additional Screening:  Hepatitis C Screening: Completed 08/20/14  Vision Screening: Recommended annual ophthalmology exams for early detection of glaucoma and other disorders of the eye. Is the patient up to date with their annual eye exam?  Yes  Who is the provider or what is the name of the office in which the patient attends annual eye exams? Dr Darrel Reach  If pt is not established with a provider, would they like to be referred to a provider to establish care? No .    Dental Screening: Recommended annual dental exams for proper oral hygiene  Community Resource Referral / Chronic Care Management: CRR  required this visit?  No   CCM required this visit?  No      Plan:     I have personally reviewed and noted the following in the patients chart:   Medical and social history Use of alcohol, tobacco or illicit drugs  Current medications and supplements including opioid prescriptions. Patient is not currently taking opioid prescriptions. Functional ability and status Nutritional status Physical activity Advanced directives List of other physicians Hospitalizations, surgeries, and ER visits in previous 12 months Vitals Screenings to include cognitive, depression, and falls Referrals and appointments  In addition, I have reviewed and discussed with patient certain preventive protocols, quality metrics, and best practice recommendations. A written personalized care plan for preventive services as well as general preventive health recommendations were provided to patient.     Willette Brace, LPN   [id number]/[id number]   Nurse Notes: None

## 2021-12-22 DIAGNOSIS — C4402 Squamous cell carcinoma of skin of lip: Secondary | ICD-10-CM | POA: Diagnosis not present

## 2021-12-22 DIAGNOSIS — Z85828 Personal history of other malignant neoplasm of skin: Secondary | ICD-10-CM | POA: Diagnosis not present

## 2022-01-20 ENCOUNTER — Ambulatory Visit (INDEPENDENT_AMBULATORY_CARE_PROVIDER_SITE_OTHER): Payer: Medicare PPO

## 2022-01-20 ENCOUNTER — Ambulatory Visit: Payer: Medicare PPO

## 2022-01-20 ENCOUNTER — Other Ambulatory Visit: Payer: Self-pay

## 2022-01-20 DIAGNOSIS — Z23 Encounter for immunization: Secondary | ICD-10-CM

## 2022-01-20 NOTE — Progress Notes (Signed)
Pt tolerated Shingles vaccine ?

## 2022-04-15 ENCOUNTER — Encounter: Payer: Self-pay | Admitting: Family Medicine

## 2022-04-27 DIAGNOSIS — H00012 Hordeolum externum right lower eyelid: Secondary | ICD-10-CM | POA: Diagnosis not present

## 2022-05-03 DIAGNOSIS — M9903 Segmental and somatic dysfunction of lumbar region: Secondary | ICD-10-CM | POA: Diagnosis not present

## 2022-05-03 DIAGNOSIS — M9901 Segmental and somatic dysfunction of cervical region: Secondary | ICD-10-CM | POA: Diagnosis not present

## 2022-05-03 DIAGNOSIS — M9902 Segmental and somatic dysfunction of thoracic region: Secondary | ICD-10-CM | POA: Diagnosis not present

## 2022-05-03 DIAGNOSIS — M9905 Segmental and somatic dysfunction of pelvic region: Secondary | ICD-10-CM | POA: Diagnosis not present

## 2022-05-04 ENCOUNTER — Telehealth: Payer: Self-pay | Admitting: Family Medicine

## 2022-05-04 NOTE — Telephone Encounter (Signed)
LVM for pt to schedule an appt for ear cleaning or for ENT referral within [facility]

## 2022-05-04 NOTE — Telephone Encounter (Signed)
Patient was going to Dr. Lucia Gaskins, Otolaryngology, for regular ear cleanings. At this time he is wondering if you have anymore recommendations of someone who could do this. He prefers someone in the [facility] system but this is not a must. Please Advise.

## 2022-05-04 NOTE — Telephone Encounter (Signed)
Patient need OV  If needing ear lavage we can do it in clinic or we can placed a referral to ENT  Last OV 11/2021

## 2022-05-05 NOTE — Telephone Encounter (Signed)
Pt scheduled for 06/19 with AA at [facility]

## 2022-05-06 DIAGNOSIS — M9903 Segmental and somatic dysfunction of lumbar region: Secondary | ICD-10-CM | POA: Diagnosis not present

## 2022-05-06 DIAGNOSIS — M9905 Segmental and somatic dysfunction of pelvic region: Secondary | ICD-10-CM | POA: Diagnosis not present

## 2022-05-06 DIAGNOSIS — M9901 Segmental and somatic dysfunction of cervical region: Secondary | ICD-10-CM | POA: Diagnosis not present

## 2022-05-06 DIAGNOSIS — M9902 Segmental and somatic dysfunction of thoracic region: Secondary | ICD-10-CM | POA: Diagnosis not present

## 2022-05-10 ENCOUNTER — Encounter: Payer: Self-pay | Admitting: Physician Assistant

## 2022-05-10 ENCOUNTER — Ambulatory Visit: Payer: Medicare PPO | Admitting: Physician Assistant

## 2022-05-10 VITALS — BP 158/86 | HR 60 | Temp 98.1°F | Ht 70.0 in | Wt 199.4 lb

## 2022-05-10 DIAGNOSIS — M9903 Segmental and somatic dysfunction of lumbar region: Secondary | ICD-10-CM | POA: Diagnosis not present

## 2022-05-10 DIAGNOSIS — M9905 Segmental and somatic dysfunction of pelvic region: Secondary | ICD-10-CM | POA: Diagnosis not present

## 2022-05-10 DIAGNOSIS — H6121 Impacted cerumen, right ear: Secondary | ICD-10-CM

## 2022-05-10 DIAGNOSIS — M9902 Segmental and somatic dysfunction of thoracic region: Secondary | ICD-10-CM | POA: Diagnosis not present

## 2022-05-10 DIAGNOSIS — M9901 Segmental and somatic dysfunction of cervical region: Secondary | ICD-10-CM | POA: Diagnosis not present

## 2022-05-10 NOTE — Progress Notes (Signed)
   Subjective:    Patient ID: Troy [name], male    DOB: [birth year], 71 y.o.   MRN: [id number]  Chief Complaint  Patient presents with   Ear Fullness    Pt c/o ear fullness and needing ear lavage; was seeing Dr Lucia Gaskins once per year and wanted to have ears examined today to see if they needs to be lavaged. Pt states he has white coat syndrome    Ear Fullness    Patient is in today for complaints of ear fullness - possibly needs cleaning. No drainage, ringing in ears, headaches, or other concerns.   Past Medical History:  Diagnosis Date   BPH (benign prostatic hyperplasia)     Past Surgical History:  Procedure Laterality Date   COLONOSCOPY  11/24/2005   STARK-TICS ONLY   EYE SURGERY Bilateral 11/23/2003   lasik   JOINT REPLACEMENT Left 11/22/2005   Lt THR - Dr Ronnie Derby   POLYPECTOMY  2017   TA x 2    Family History  Problem Relation Age of Onset   Heart disease Father    Cancer Brother        Lung Cancer   Colon cancer Neg Hx    Colon polyps Neg Hx    Esophageal cancer Neg Hx    Rectal cancer Neg Hx    Stomach cancer Neg Hx    Prostate cancer Neg Hx     Social History   Tobacco Use   Smoking status: Never   Smokeless tobacco: Never  Vaping Use   Vaping Use: Never used  Substance Use Topics   Alcohol use: Yes    Comment: rarely   Drug use: Never     No Known Allergies  Review of Systems NEGATIVE UNLESS OTHERWISE INDICATED IN HPI      Objective:     BP (!) 158/86 (BP Location: Left Arm)   Pulse 60   Temp 98.1 F (36.7 C) (Temporal)   Ht '5\' 10"'$  (1.778 m)   Wt 199 lb 6.4 oz (90.4 kg)   SpO2 98%   BMI 28.61 kg/m   Wt Readings from Last 3 Encounters:  05/10/22 199 lb 6.4 oz (90.4 kg)  11/18/21 195 lb (88.5 kg)  10/19/21 200 lb (90.7 kg)    BP Readings from Last 3 Encounters:  05/10/22 (!) 158/86  11/18/21 126/74  10/19/21 119/68     Physical Exam Constitutional:      Appearance: Normal appearance.  HENT:     Right Ear: External ear  normal. There is no impacted cerumen.     Left Ear: Tympanic membrane, ear canal and external ear normal.  Neurological:     Mental Status: He is alert.  Psychiatric:        Mood and Affect: Mood normal.        Assessment & Plan:   Problem List Items Addressed This Visit   None Visit Diagnoses     Right ear impacted cerumen    -  Primary       Ceruminosis is noted. Removal procedure explained and verbal consent obtained from patient. Wax is removed by syringing from right ear canal. Pt tolerated procedure well. Instructions for home care to prevent wax buildup are given.    Kenli Waldo M Darien Mignogna, PA-C

## 2022-05-17 DIAGNOSIS — M9902 Segmental and somatic dysfunction of thoracic region: Secondary | ICD-10-CM | POA: Diagnosis not present

## 2022-05-17 DIAGNOSIS — M9905 Segmental and somatic dysfunction of pelvic region: Secondary | ICD-10-CM | POA: Diagnosis not present

## 2022-05-17 DIAGNOSIS — M9903 Segmental and somatic dysfunction of lumbar region: Secondary | ICD-10-CM | POA: Diagnosis not present

## 2022-05-17 DIAGNOSIS — M9901 Segmental and somatic dysfunction of cervical region: Secondary | ICD-10-CM | POA: Diagnosis not present

## 2022-05-19 DIAGNOSIS — M9905 Segmental and somatic dysfunction of pelvic region: Secondary | ICD-10-CM | POA: Diagnosis not present

## 2022-05-19 DIAGNOSIS — M9903 Segmental and somatic dysfunction of lumbar region: Secondary | ICD-10-CM | POA: Diagnosis not present

## 2022-05-19 DIAGNOSIS — M9901 Segmental and somatic dysfunction of cervical region: Secondary | ICD-10-CM | POA: Diagnosis not present

## 2022-05-19 DIAGNOSIS — M9902 Segmental and somatic dysfunction of thoracic region: Secondary | ICD-10-CM | POA: Diagnosis not present

## 2022-05-28 DIAGNOSIS — M9905 Segmental and somatic dysfunction of pelvic region: Secondary | ICD-10-CM | POA: Diagnosis not present

## 2022-05-28 DIAGNOSIS — M9901 Segmental and somatic dysfunction of cervical region: Secondary | ICD-10-CM | POA: Diagnosis not present

## 2022-05-28 DIAGNOSIS — M9902 Segmental and somatic dysfunction of thoracic region: Secondary | ICD-10-CM | POA: Diagnosis not present

## 2022-05-28 DIAGNOSIS — M9903 Segmental and somatic dysfunction of lumbar region: Secondary | ICD-10-CM | POA: Diagnosis not present

## 2022-06-02 ENCOUNTER — Other Ambulatory Visit (INDEPENDENT_AMBULATORY_CARE_PROVIDER_SITE_OTHER): Payer: Medicare PPO

## 2022-06-02 DIAGNOSIS — M9901 Segmental and somatic dysfunction of cervical region: Secondary | ICD-10-CM | POA: Diagnosis not present

## 2022-06-02 DIAGNOSIS — M9903 Segmental and somatic dysfunction of lumbar region: Secondary | ICD-10-CM | POA: Diagnosis not present

## 2022-06-02 DIAGNOSIS — M9905 Segmental and somatic dysfunction of pelvic region: Secondary | ICD-10-CM | POA: Diagnosis not present

## 2022-06-02 DIAGNOSIS — M9902 Segmental and somatic dysfunction of thoracic region: Secondary | ICD-10-CM | POA: Diagnosis not present

## 2022-06-02 DIAGNOSIS — N400 Enlarged prostate without lower urinary tract symptoms: Secondary | ICD-10-CM | POA: Diagnosis not present

## 2022-06-02 DIAGNOSIS — E785 Hyperlipidemia, unspecified: Secondary | ICD-10-CM

## 2022-06-02 LAB — LIPID PANEL
Cholesterol: 151 mg/dL (ref 0–200)
HDL: 36.4 mg/dL — ABNORMAL LOW (ref >39.00)
NonHDL: 114.7
Total CHOL/HDL Ratio: 4
Triglycerides: 211 mg/dL — ABNORMAL HIGH (ref 0.0–149.0)
VLDL: 42.2 mg/dL — ABNORMAL HIGH (ref 0.0–40.0)

## 2022-06-02 LAB — PSA: PSA: 1.96 ng/mL (ref 0.10–4.00)

## 2022-06-02 LAB — LDL CHOLESTEROL, DIRECT: Direct LDL: 97 mg/dL

## 2022-06-03 ENCOUNTER — Encounter: Payer: Self-pay | Admitting: Family Medicine

## 2022-06-04 NOTE — Progress Notes (Signed)
Please inform patient of the following:  Cholesterol levels are improved compared to 6 months ago.  His PSA is also better than last time we checked as well.  Do not need to make any changes to treatment plan at this time.  I like to see him back in about 6 months or so for his annual checkup and we can repeat labs at that time.

## 2022-06-07 NOTE — Telephone Encounter (Signed)
CAn we have them schedule a visit? Virtual visit is fine.  Algis Greenhouse. Jerline Pain, MD 06/07/2022 3:54 PM

## 2022-06-08 NOTE — Telephone Encounter (Signed)
Please schedule appointment.

## 2022-06-09 DIAGNOSIS — M9905 Segmental and somatic dysfunction of pelvic region: Secondary | ICD-10-CM | POA: Diagnosis not present

## 2022-06-09 DIAGNOSIS — M9903 Segmental and somatic dysfunction of lumbar region: Secondary | ICD-10-CM | POA: Diagnosis not present

## 2022-06-09 DIAGNOSIS — M9902 Segmental and somatic dysfunction of thoracic region: Secondary | ICD-10-CM | POA: Diagnosis not present

## 2022-06-09 DIAGNOSIS — M9901 Segmental and somatic dysfunction of cervical region: Secondary | ICD-10-CM | POA: Diagnosis not present

## 2022-06-11 ENCOUNTER — Telehealth: Payer: Medicare PPO | Admitting: Family Medicine

## 2022-06-11 ENCOUNTER — Encounter: Payer: Self-pay | Admitting: Family Medicine

## 2022-06-11 DIAGNOSIS — E785 Hyperlipidemia, unspecified: Secondary | ICD-10-CM

## 2022-06-11 NOTE — Assessment & Plan Note (Signed)
Had lengthy discussion with patient regarding his recent lipid panel.  He did have improvement in his LDL.  He has been doing well with Lipitor 10 mg 3-4 times weekly.  No significant side effects.  He will come back in 6 months and we can recheck LDL.  We discussed lifestyle modifications.  Recommended against use of niacin due to side effects.

## 2022-06-11 NOTE — Progress Notes (Signed)
   Troy [name] is a 71 y.o. male who presents today for a virtual office visit.  Assessment/Plan:  Chronic Problems Addressed Today: Dyslipidemia Had lengthy discussion with patient regarding his recent lipid panel.  He did have improvement in his LDL.  He has been doing well with Lipitor 10 mg 3-4 times weekly.  No significant side effects.  He will come back in 6 months and we can recheck LDL.  We discussed lifestyle modifications.  Recommended against use of niacin due to side effects.     Subjective:  HPI:  See A/P for status of chronic conditions.       Objective/Observations  Physical Exam: Gen: NAD, resting comfortably Pulm: Normal work of breathing Neuro: Grossly normal, moves all extremities Psych: Normal affect and thought content  Virtual Visit via Video   I connected with Troy [name] on 06/11/22 at 11:40 AM EDT by a video enabled telemedicine application and verified that I am speaking with the correct person using two identifiers. The limitations of evaluation and management by telemedicine and the availability of in person appointments were discussed. The patient expressed understanding and agreed to proceed.   Patient location: Home Provider location: [place] participating in the virtual visit: Myself and Patient     Algis Greenhouse. Jerline Pain, MD 06/11/2022 12:32 PM

## 2022-06-16 DIAGNOSIS — M9901 Segmental and somatic dysfunction of cervical region: Secondary | ICD-10-CM | POA: Diagnosis not present

## 2022-06-16 DIAGNOSIS — M9905 Segmental and somatic dysfunction of pelvic region: Secondary | ICD-10-CM | POA: Diagnosis not present

## 2022-06-16 DIAGNOSIS — M9902 Segmental and somatic dysfunction of thoracic region: Secondary | ICD-10-CM | POA: Diagnosis not present

## 2022-06-16 DIAGNOSIS — M9903 Segmental and somatic dysfunction of lumbar region: Secondary | ICD-10-CM | POA: Diagnosis not present

## 2022-06-30 DIAGNOSIS — M9901 Segmental and somatic dysfunction of cervical region: Secondary | ICD-10-CM | POA: Diagnosis not present

## 2022-06-30 DIAGNOSIS — M9903 Segmental and somatic dysfunction of lumbar region: Secondary | ICD-10-CM | POA: Diagnosis not present

## 2022-06-30 DIAGNOSIS — M9905 Segmental and somatic dysfunction of pelvic region: Secondary | ICD-10-CM | POA: Diagnosis not present

## 2022-06-30 DIAGNOSIS — M9902 Segmental and somatic dysfunction of thoracic region: Secondary | ICD-10-CM | POA: Diagnosis not present

## 2022-07-14 DIAGNOSIS — M9903 Segmental and somatic dysfunction of lumbar region: Secondary | ICD-10-CM | POA: Diagnosis not present

## 2022-07-14 DIAGNOSIS — M9902 Segmental and somatic dysfunction of thoracic region: Secondary | ICD-10-CM | POA: Diagnosis not present

## 2022-07-14 DIAGNOSIS — M9901 Segmental and somatic dysfunction of cervical region: Secondary | ICD-10-CM | POA: Diagnosis not present

## 2022-07-14 DIAGNOSIS — M9905 Segmental and somatic dysfunction of pelvic region: Secondary | ICD-10-CM | POA: Diagnosis not present

## 2022-08-04 DIAGNOSIS — M9905 Segmental and somatic dysfunction of pelvic region: Secondary | ICD-10-CM | POA: Diagnosis not present

## 2022-08-04 DIAGNOSIS — M9903 Segmental and somatic dysfunction of lumbar region: Secondary | ICD-10-CM | POA: Diagnosis not present

## 2022-08-04 DIAGNOSIS — M9902 Segmental and somatic dysfunction of thoracic region: Secondary | ICD-10-CM | POA: Diagnosis not present

## 2022-08-04 DIAGNOSIS — M9901 Segmental and somatic dysfunction of cervical region: Secondary | ICD-10-CM | POA: Diagnosis not present

## 2022-08-06 ENCOUNTER — Ambulatory Visit: Payer: Medicare PPO | Admitting: Family

## 2022-08-06 ENCOUNTER — Encounter: Payer: Self-pay | Admitting: Family

## 2022-08-06 VITALS — BP 138/90 | HR 65 | Temp 98.1°F | Ht 70.0 in | Wt 197.6 lb

## 2022-08-06 DIAGNOSIS — H6123 Impacted cerumen, bilateral: Secondary | ICD-10-CM | POA: Diagnosis not present

## 2022-08-06 NOTE — Progress Notes (Signed)
   Patient ID: Troy [name], male    DOB: Apr 29, 1951, 70 y.o.   MRN: [id number]  Chief Complaint  Patient presents with   Cerumen Impaction    HPI:      Cerumen impaction:  reports having hx of repeated impaction in both ears. He is unable to extract this at home very well. He reports feeling fullness and decreased hearing in both ears, but denies any pain.  Assessment & Plan:  1. Bilateral impacted cerumen Verbal consent received to perform bilateral ear lavage via Hydrogen peroxide/water mix solution. Pt tolerated well, complete evacuation of all cerumen obtained. Mild erythema but no bleeding noted in ear canals after procedure. Advised pt to look for generic Debrox, OTC, and use several drops in both ears x 3 nights, then wash ears with water after the 3rd night in the morning.  Subjective:    Outpatient Medications Prior to Visit  Medication Sig Dispense Refill   atorvastatin (LIPITOR) 10 MG tablet Take 1 tablet (10 mg total) by mouth daily. 90 tablet 3   finasteride (PROSCAR) 5 MG tablet Take 1 tablet (5 mg total) by mouth daily. 90 tablet 3   ibuprofen (ADVIL) 100 MG tablet Take by mouth.     Multiple Vitamin (MULTIVITAMIN) capsule Take by mouth.     sildenafil (REVATIO) 20 MG tablet Take 2 to 5 tablets Daily as needed 90 tablet 9   No facility-administered medications prior to visit.   Past Medical History:  Diagnosis Date   BPH (benign prostatic hyperplasia)    Past Surgical History:  Procedure Laterality Date   COLONOSCOPY  11/24/2005   STARK-TICS ONLY   EYE SURGERY Bilateral 11/23/2003   lasik   JOINT REPLACEMENT Left 11/22/2005   Lt THR - Dr Ronnie Derby   POLYPECTOMY  2017   TA x 2   No Known Allergies    Objective:    Physical Exam Vitals and nursing note reviewed.  Constitutional:      General: He is not in acute distress.    Appearance: Normal appearance.  HENT:     Head: Normocephalic.     Right Ear: Tympanic membrane normal. There is impacted cerumen (TM  wnl after removal).     Left Ear: Tympanic membrane normal. There is impacted cerumen (TM wnl after removal).  Cardiovascular:     Rate and Rhythm: Normal rate and regular rhythm.  Pulmonary:     Effort: Pulmonary effort is normal.     Breath sounds: Normal breath sounds.  Musculoskeletal:        General: Normal range of motion.     Cervical back: Normal range of motion.  Skin:    General: Skin is warm and dry.  Neurological:     Mental Status: He is alert and oriented to person, place, and time.  Psychiatric:        Mood and Affect: Mood normal.    BP (!) 138/90 (BP Location: Left Arm, Patient Position: Sitting, Cuff Size: Large)   Pulse 65   Temp 98.1 F (36.7 C) (Temporal)   Ht '5\' 10"'$  (1.778 m)   Wt 197 lb 9.6 oz (89.6 kg)   SpO2 97%   BMI 28.35 kg/m  Wt Readings from Last 3 Encounters:  08/06/22 197 lb 9.6 oz (89.6 kg)  06/11/22 190 lb (86.2 kg)  05/10/22 199 lb 6.4 oz (90.4 kg)       Jeanie Sewer, NP

## 2022-08-16 ENCOUNTER — Encounter: Payer: Self-pay | Admitting: *Deleted

## 2022-09-08 ENCOUNTER — Ambulatory Visit: Payer: Medicare PPO | Admitting: Family Medicine

## 2022-09-08 ENCOUNTER — Ambulatory Visit: Payer: Self-pay

## 2022-09-08 ENCOUNTER — Ambulatory Visit (INDEPENDENT_AMBULATORY_CARE_PROVIDER_SITE_OTHER): Payer: Medicare PPO

## 2022-09-08 VITALS — BP 154/90 | HR 68 | Ht 70.0 in | Wt 196.0 lb

## 2022-09-08 DIAGNOSIS — G8929 Other chronic pain: Secondary | ICD-10-CM | POA: Diagnosis not present

## 2022-09-08 DIAGNOSIS — M25562 Pain in left knee: Secondary | ICD-10-CM | POA: Diagnosis not present

## 2022-09-08 NOTE — Progress Notes (Unsigned)
   I, Troy [name], LAT, ATC acting as a scribe for Troy Leader, MD.  Subjective:    CC: L knee pain  HPI: Pt is a 71 y/o male c/o L knee pain.  Patient was previously seen by Dr. Georgina [name] for a gout flare in his left wrist on 08/11/2020.  Today, patient complains of left knee pain ongoing for about 5 days w/ no MOI. Pt does recall doing a hard treadmill run on Thursday.  Patient locates pain to around the L tibial tuberosity. Pt has been able to ride a bike and do an elliptical workout and played golf yesterday.  L knee swelling: yes Mechanical symptoms: no Aggravates: heavy impact- running, fast walking Treatments tried: ice, IBU, meloxicam  Dx testing: 08/11/2020 labs (CMET & Uric acid)  Pertinent review of Systems: ***  Relevant historical information: ***   Objective:   There were no vitals filed for this visit. General: Well Developed, well nourished, and in no acute distress.   MSK: ***  Lab and Radiology Results No results found for this or any previous visit (from the past 72 hour(s)). No results found.    Impression and Recommendations:    Assessment and Plan: 71 y.o. male with ***.  PDMP not reviewed this encounter. No orders of the defined types were placed in this encounter.  No orders of the defined types were placed in this encounter.   Discussed warning signs or symptoms. Please see discharge instructions. Patient expresses understanding.   ***

## 2022-09-08 NOTE — Patient Instructions (Signed)
Thank you for coming in today.   I think doing the knee extension with letting the weights down slowly is good.   Voltaren gel and meloxicam is ok  I can do an injection whenever   Recheck as needed.

## 2022-09-09 ENCOUNTER — Encounter: Payer: Self-pay | Admitting: Family Medicine

## 2022-09-13 NOTE — Progress Notes (Signed)
Left knee x-ray shows medium arthritis changes.

## 2022-09-20 DIAGNOSIS — M9903 Segmental and somatic dysfunction of lumbar region: Secondary | ICD-10-CM | POA: Diagnosis not present

## 2022-09-20 DIAGNOSIS — M9901 Segmental and somatic dysfunction of cervical region: Secondary | ICD-10-CM | POA: Diagnosis not present

## 2022-09-20 DIAGNOSIS — M9902 Segmental and somatic dysfunction of thoracic region: Secondary | ICD-10-CM | POA: Diagnosis not present

## 2022-09-20 DIAGNOSIS — M9905 Segmental and somatic dysfunction of pelvic region: Secondary | ICD-10-CM | POA: Diagnosis not present

## 2022-10-06 DIAGNOSIS — M9901 Segmental and somatic dysfunction of cervical region: Secondary | ICD-10-CM | POA: Diagnosis not present

## 2022-10-06 DIAGNOSIS — M9902 Segmental and somatic dysfunction of thoracic region: Secondary | ICD-10-CM | POA: Diagnosis not present

## 2022-10-06 DIAGNOSIS — M9905 Segmental and somatic dysfunction of pelvic region: Secondary | ICD-10-CM | POA: Diagnosis not present

## 2022-10-06 DIAGNOSIS — M9903 Segmental and somatic dysfunction of lumbar region: Secondary | ICD-10-CM | POA: Diagnosis not present

## 2022-10-07 ENCOUNTER — Encounter: Payer: Self-pay | Admitting: Family Medicine

## 2022-10-08 ENCOUNTER — Other Ambulatory Visit: Payer: Self-pay | Admitting: *Deleted

## 2022-11-24 ENCOUNTER — Ambulatory Visit (INDEPENDENT_AMBULATORY_CARE_PROVIDER_SITE_OTHER): Payer: Medicare PPO | Admitting: Family Medicine

## 2022-11-24 ENCOUNTER — Encounter: Payer: Self-pay | Admitting: Family Medicine

## 2022-11-24 VITALS — BP 156/80 | HR 64 | Temp 98.0°F | Ht 70.0 in | Wt 197.4 lb

## 2022-11-24 DIAGNOSIS — R739 Hyperglycemia, unspecified: Secondary | ICD-10-CM | POA: Diagnosis not present

## 2022-11-24 DIAGNOSIS — M109 Gout, unspecified: Secondary | ICD-10-CM | POA: Diagnosis not present

## 2022-11-24 DIAGNOSIS — R030 Elevated blood-pressure reading, without diagnosis of hypertension: Secondary | ICD-10-CM | POA: Diagnosis not present

## 2022-11-24 DIAGNOSIS — N400 Enlarged prostate without lower urinary tract symptoms: Secondary | ICD-10-CM | POA: Diagnosis not present

## 2022-11-24 DIAGNOSIS — Z0001 Encounter for general adult medical examination with abnormal findings: Secondary | ICD-10-CM | POA: Diagnosis not present

## 2022-11-24 DIAGNOSIS — E785 Hyperlipidemia, unspecified: Secondary | ICD-10-CM | POA: Diagnosis not present

## 2022-11-24 DIAGNOSIS — N529 Male erectile dysfunction, unspecified: Secondary | ICD-10-CM

## 2022-11-24 LAB — LIPID PANEL
Cholesterol: 135 mg/dL (ref 0–200)
HDL: 41 mg/dL (ref >39.00)
LDL Cholesterol: 71 mg/dL (ref 0–99)
NonHDL: 94.14
Total CHOL/HDL Ratio: 3
Triglycerides: 115 mg/dL (ref 0.0–149.0)
VLDL: 23 mg/dL (ref 0.0–40.0)

## 2022-11-24 LAB — COMPREHENSIVE METABOLIC PANEL
ALT: 28 U/L (ref 0–53)
AST: 23 U/L (ref 0–37)
Albumin: 4.6 g/dL (ref 3.5–5.2)
Alkaline Phosphatase: 61 U/L (ref 39–117)
BUN: 12 mg/dL (ref 6–23)
CO2: 24 mEq/L (ref 19–32)
Calcium: 9.6 mg/dL (ref 8.4–10.5)
Chloride: 103 mEq/L (ref 96–112)
Creatinine, Ser: 0.9 mg/dL (ref 0.40–1.50)
GFR: 85.68 mL/min (ref >60.00)
Glucose, Bld: 79 mg/dL (ref 70–99)
Potassium: 4.7 mEq/L (ref 3.5–5.1)
Sodium: 136 mEq/L (ref 135–145)
Total Bilirubin: 0.5 mg/dL (ref 0.2–1.2)
Total Protein: 7 g/dL (ref 6.0–8.3)

## 2022-11-24 LAB — CBC
HCT: 44.4 % (ref 39.0–52.0)
Hemoglobin: 15.3 g/dL (ref 13.0–17.0)
MCHC: 34.4 g/dL (ref 30.0–36.0)
MCV: 93.3 fl (ref 78.0–100.0)
Platelets: 275 10*3/uL (ref 150.0–400.0)
RBC: 4.76 Mil/uL (ref 4.22–5.81)
RDW: 13.1 % (ref 11.5–15.5)
WBC: 5.6 10*3/uL (ref 4.0–10.5)

## 2022-11-24 LAB — HEMOGLOBIN A1C: Hgb A1c MFr Bld: 5.5 % (ref 4.6–6.5)

## 2022-11-24 LAB — PSA: PSA: 2.18 ng/mL (ref 0.10–4.00)

## 2022-11-24 LAB — TSH: TSH: 2.12 u[IU]/mL (ref 0.35–5.50)

## 2022-11-24 MED ORDER — SILDENAFIL CITRATE 20 MG PO TABS: ORAL_TABLET | ORAL | 9 refills | Status: AC

## 2022-11-24 MED ORDER — ATORVASTATIN CALCIUM 10 MG PO TABS: 10.0000 mg | ORAL_TABLET | ORAL | 3 refills | Status: DC

## 2022-11-24 MED ORDER — FINASTERIDE 5 MG PO TABS: 5.0000 mg | ORAL_TABLET | Freq: Every day | ORAL | 3 refills | Status: DC

## 2022-11-24 NOTE — Assessment & Plan Note (Signed)
He is doing well before 10 mg every other day.  Will recheck lipids today.  Discussed lifestyle modifications.

## 2022-11-24 NOTE — Assessment & Plan Note (Signed)
Slightly above JNC 8 goal today.  He has typically been well-controlled.  Patient does admit to having whitecoat hypertension.  Home readings have been at goal.  He will continue monitor at home and let us know if persistently elevated.  Check labs today.

## 2022-11-24 NOTE — Patient Instructions (Signed)
It was very nice to see you today!  Keep up the great work!  I will refill your medications today.  We will check blood work.  Will see you back in year for your next physical.  Come back sooner if needed.  Take care, Dr Jerline Pain  PLEASE NOTE:  If you had any lab tests, please let us know if you have not heard back within a few days. You may see your results on mychart before we have a chance to review them but we will give you a call once they are reviewed by [place].   If we ordered any referrals today, please let us know if you have not heard from their office within the next week.   If you had any urgent prescriptions sent in today, please check with the pharmacy within an hour of our visit to make sure the prescription was transmitted appropriately.   Please try these tips to maintain a healthy lifestyle:  Eat at least 3 REAL meals and 1-2 snacks per day.  Aim for no more than 5 hours between eating.  If you eat breakfast, please do so within one hour of getting up.   Each meal should contain half fruits/vegetables, one quarter protein, and one quarter carbs (no bigger than a computer mouse)  Cut down on sweet beverages. This includes juice, soda, and sweet tea.   Drink at least 1 glass of water with each meal and aim for at least 8 glasses per day  Exercise at least 150 minutes every week.    Preventive Care 61 Years and Older, Male Preventive care refers to lifestyle choices and visits with your health care provider that can promote health and wellness. Preventive care visits are also called wellness exams. What can I expect for my preventive care visit? Counseling During your preventive care visit, your health care provider may ask about your: Medical history, including: Past medical problems. Family medical history. History of falls. Current health, including: Emotional well-being. Home life and relationship well-being. Sexual activity. Memory and ability to understand  (cognition). Lifestyle, including: Alcohol, nicotine or tobacco, and drug use. Access to firearms. Diet, exercise, and sleep habits. Work and work Statistician. Sunscreen use. Safety issues such as seatbelt and bike helmet use. Physical exam Your health care provider will check your: Height and weight. These may be used to calculate your BMI (body mass index). BMI is a measurement that tells if you are at a healthy weight. Waist circumference. This measures the distance around your waistline. This measurement also tells if you are at a healthy weight and may help predict your risk of certain diseases, such as type 2 diabetes and high blood pressure. Heart rate and blood pressure. Body temperature. Skin for abnormal spots. What immunizations do I need?  Vaccines are usually given at various ages, according to a schedule. Your health care provider will recommend vaccines for you based on your age, medical history, and lifestyle or other factors, such as travel or where you work. What tests do I need? Screening Your health care provider may recommend screening tests for certain conditions. This may include: Lipid and cholesterol levels. Diabetes screening. This is done by checking your blood sugar (glucose) after you have not eaten for a while (fasting). Hepatitis C test. Hepatitis B test. HIV (human immunodeficiency virus) test. STI (sexually transmitted infection) testing, if you are at risk. Lung cancer screening. Colorectal cancer screening. Prostate cancer screening. Abdominal aortic aneurysm (AAA) screening. You may need this  if you are a current or former smoker. Talk with your health care provider about your test results, treatment options, and if necessary, the need for more tests. Follow these instructions at home: Eating and drinking  Eat a diet that includes fresh fruits and vegetables, whole grains, lean protein, and low-fat dairy products. Limit your intake of foods with  high amounts of sugar, saturated fats, and salt. Take vitamin and mineral supplements as recommended by your health care provider. Do not drink alcohol if your health care provider tells you not to drink. If you drink alcohol: Limit how much you have to 0-2 drinks a day. Know how much alcohol is in your drink. In the [place] one drink equals one 12 oz bottle of beer (355 mL), one 5 oz glass of wine (148 mL), or one 1 oz glass of hard liquor (44 mL). Lifestyle Brush your teeth every morning and night with fluoride toothpaste. Floss one time each day. Exercise for at least 30 minutes 5 or more days each week. Do not use any products that contain nicotine or tobacco. These products include cigarettes, chewing tobacco, and vaping devices, such as e-cigarettes. If you need help quitting, ask your health care provider. Do not use drugs. If you are sexually active, practice safe sex. Use a condom or other form of protection to prevent STIs. Take aspirin only as told by your health care provider. Make sure that you understand how much to take and what form to take. Work with your health care provider to find out whether it is safe and beneficial for you to take aspirin daily. Ask your health care provider if you need to take a cholesterol-lowering medicine (statin). Find healthy ways to manage stress, such as: Meditation, yoga, or listening to music. Journaling. Talking to a trusted person. Spending time with friends and family. Safety Always wear your seat belt while driving or riding in a vehicle. Do not drive: If you have been drinking alcohol. Do not ride with someone who has been drinking. When you are tired or distracted. While texting. If you have been using any mind-altering substances or drugs. Wear a helmet and other protective equipment during sports activities. If you have firearms in your house, make sure you follow all gun safety procedures. Minimize exposure to UV radiation to  reduce your risk of skin cancer. What's next? Visit your health care provider once a year for an annual wellness visit. Ask your health care provider how often you should have your eyes and teeth checked. Stay up to date on all vaccines. This information is not intended to replace advice given to you by your health care provider. Make sure you discuss any questions you have with your health care provider. Document Revised: 05/06/2021 Document Reviewed: 05/06/2021 Elsevier Patient Education  [place].

## 2022-11-24 NOTE — Assessment & Plan Note (Signed)
Stable.  No recent gout flares.

## 2022-11-24 NOTE — Assessment & Plan Note (Signed)
Check PSA.  Continue finasteride 5 mg daily.

## 2022-11-24 NOTE — Assessment & Plan Note (Signed)
Stable on sildenafil as needed.  Will refill today.

## 2022-11-24 NOTE — Progress Notes (Signed)
Chief Complaint:  Troy [name] is a 72 y.o. male who presents today for his annual comprehensive physical exam.    Assessment/Plan:  Chronic Problems Addressed Today: Elevated blood pressure reading Slightly above JNC 8 goal today.  He has typically been well-controlled.  Patient does admit to having whitecoat hypertension.  Home readings have been at goal.  He will continue monitor at home and let us know if persistently elevated.  Check labs today.  Gout Stable.  No recent gout flares.  Benign prostatic hyperplasia Check PSA.  Continue finasteride 5 mg daily.  Erectile dysfunction Stable on sildenafil as needed.  Will refill today.  Dyslipidemia He is doing well before 10 mg every other day.  Will recheck lipids today.  Discussed lifestyle modifications.  Preventative Healthcare: Check labs.  Up-to-date on vaccines.  Up-to-date on colon cancer screening.  Patient Counseling(The following topics were reviewed and/or handout was given):  -Nutrition: Stressed importance of moderation in sodium/caffeine intake, saturated fat and cholesterol, caloric balance, sufficient intake of fresh fruits, vegetables, and fiber.  -Stressed the importance of regular exercise.   -Substance Abuse: Discussed cessation/primary prevention of tobacco, alcohol, or other drug use; driving or other dangerous activities under the influence; availability of treatment for abuse.   -Injury prevention: Discussed safety belts, safety helmets, smoke detector, smoking near bedding or upholstery.   -Sexuality: Discussed sexually transmitted diseases, partner selection, use of condoms, avoidance of unintended pregnancy and contraceptive alternatives.   -Dental health: Discussed importance of regular tooth brushing, flossing, and dental visits.  -Health maintenance and immunizations reviewed. Please refer to Health maintenance section.  Return to care in 1 year for next preventative visit.     Subjective:   HPI:  He has no acute complaints today.   Lifestyle Diet: Balanced. Plenty of fruits and vegetables. Med diet.  Exercise: Cardio 2-3 times per week.      11/24/2022    9:55 AM  Depression screen PHQ 2/9  Decreased Interest 0  Down, Depressed, Hopeless 0  PHQ - 2 Score 0    Health Maintenance Due  Topic Date Due   Medicare Annual Wellness (AWV)  12/08/2022     ROS: Per HPI, otherwise a complete review of systems was negative.   PMH:  The following were reviewed and entered/updated in epic: Past Medical History:  Diagnosis Date   BPH (benign prostatic hyperplasia)    Patient Active Problem List   Diagnosis Date Noted   Elevated blood pressure reading 11/24/2022   Degenerative disc disease, cervical 05/15/2020   Benign prostatic hyperplasia 05/22/2019   Erectile dysfunction 05/22/2019   Gout 01/08/2019   Degenerative disc disease, lumbar 12/11/2018   Nonallopathic lesion of sacral region 12/11/2018   Nonallopathic lesion of lumbar region 12/11/2018   Nonallopathic lesion of thoracic region 12/11/2018   Dyslipidemia 08/20/2014   Osteoarthritis 07/15/2009   Past Surgical History:  Procedure Laterality Date   COLONOSCOPY  11/24/2005   STARK-TICS ONLY   EYE SURGERY Bilateral 11/23/2003   lasik   JOINT REPLACEMENT Left 11/22/2005   Lt THR - Dr Ronnie Derby   POLYPECTOMY  2017   TA x 2    Family History  Problem Relation Age of Onset   Heart disease Father    Cancer Brother        Lung Cancer   Colon cancer Neg Hx    Colon polyps Neg Hx    Esophageal cancer Neg Hx    Rectal cancer Neg Hx    Stomach  cancer Neg Hx    Prostate cancer Neg Hx     Medications- reviewed and updated Current Outpatient Medications  Medication Sig Dispense Refill   ibuprofen (ADVIL) 100 MG tablet Take by mouth.     Multiple Vitamin (MULTIVITAMIN) capsule Take by mouth.     atorvastatin (LIPITOR) 10 MG tablet Take 1 tablet (10 mg total) by mouth every other day. 45 tablet 3    finasteride (PROSCAR) 5 MG tablet Take 1 tablet (5 mg total) by mouth daily. 90 tablet 3   sildenafil (REVATIO) 20 MG tablet Take 2 to 5 tablets Daily as needed 90 tablet 9   No current facility-administered medications for this visit.    Allergies-reviewed and updated No Known Allergies  Social History   Socioeconomic History   Marital status: Married    Spouse name: Not on file   Number of children: Not on file   Years of education: Not on file   Highest education level: Not on file  Occupational History   Not on file  Tobacco Use   Smoking status: Never   Smokeless tobacco: Never  Vaping Use   Vaping Use: Never used  Substance and Sexual Activity   Alcohol use: Yes    Comment: rarely   Drug use: Never   Sexual activity: Yes  Other Topics Concern   Not on file  Social History Narrative   Not on file   Social Determinants of Health   Financial Resource Strain: Low Risk  (12/08/2021)   Overall Financial Resource Strain (CARDIA)    Difficulty of Paying Living Expenses: Not hard at all  Food Insecurity: No Food Insecurity (12/08/2021)   Hunger Vital Sign    Worried About Running Out of Food in the Last Year: Never true    Warden in the Last Year: Never true  Transportation Needs: No Transportation Needs (12/08/2021)   PRAPARE - Hydrologist (Medical): No    Lack of Transportation (Non-Medical): No  Physical Activity: Sufficiently Active (12/08/2021)   Exercise Vital Sign    Days of Exercise per Week: 7 days    Minutes of Exercise per Session: 60 min  Stress: No Stress Concern Present (12/08/2021)   [place]    Feeling of Stress : Not at all  Social Connections: Moderately Integrated (12/08/2021)   Social Connection and Isolation Panel [NHANES]    Frequency of Communication with Friends and Family: More than three times a week    Frequency of Social Gatherings with  Friends and Family: More than three times a week    Attends Religious Services: Never    Marine scientist or Organizations: Yes    Attends Music therapist: 1 to 4 times per year    Marital Status: Married        Objective:  Physical Exam: BP (!) 156/80   Pulse 64   Temp 98 F (36.7 C) (Temporal)   Ht '5\' 10"'$  (1.778 m)   Wt 197 lb 6.4 oz (89.5 kg)   SpO2 98%   BMI 28.32 kg/m   Body mass index is 28.32 kg/m. Wt Readings from Last 3 Encounters:  11/24/22 197 lb 6.4 oz (89.5 kg)  09/08/22 196 lb (88.9 kg)  08/06/22 197 lb 9.6 oz (89.6 kg)   Gen: NAD, resting comfortably HEENT: TMs normal bilaterally. OP clear. No thyromegaly noted.  CV: RRR with no murmurs appreciated Pulm: NWOB,  CTAB with no crackles, wheezes, or rhonchi GI: Normal bowel sounds present. Soft, Nontender, Nondistended. MSK: no edema, cyanosis, or clubbing noted Skin: warm, dry Neuro: CN2-12 grossly intact. Strength 5/5 in upper and lower extremities. Reflexes symmetric and intact bilaterally.  Psych: Normal affect and thought content     Dwain Huhn M. Jerline Pain, MD 11/24/2022 10:22 AM

## 2022-11-25 NOTE — Progress Notes (Signed)
Please inform patient of the following:  Great news! His labs are all normal. Do not need to make any changes to his treatment plan at this time. Would like for him to keep up the good work and we can recheck in a year.  Troy [name]. Jerline Pain, MD 11/25/2022 3:21 PM

## 2022-11-30 ENCOUNTER — Other Ambulatory Visit: Payer: Self-pay | Admitting: Family Medicine

## 2022-12-01 DIAGNOSIS — Z85828 Personal history of other malignant neoplasm of skin: Secondary | ICD-10-CM | POA: Diagnosis not present

## 2022-12-01 DIAGNOSIS — D2272 Melanocytic nevi of left lower limb, including hip: Secondary | ICD-10-CM | POA: Diagnosis not present

## 2022-12-01 DIAGNOSIS — L570 Actinic keratosis: Secondary | ICD-10-CM | POA: Diagnosis not present

## 2022-12-01 DIAGNOSIS — C44311 Basal cell carcinoma of skin of nose: Secondary | ICD-10-CM | POA: Diagnosis not present

## 2022-12-21 ENCOUNTER — Ambulatory Visit (INDEPENDENT_AMBULATORY_CARE_PROVIDER_SITE_OTHER): Payer: Medicare PPO

## 2022-12-21 VITALS — Wt 197.0 lb

## 2022-12-21 DIAGNOSIS — Z0000 Encounter for general adult medical examination without abnormal findings: Secondary | ICD-10-CM

## 2022-12-21 NOTE — Progress Notes (Signed)
I connected with  STEFANO TRULSON on 12/21/22 by a audio enabled telemedicine application and verified that I am speaking with the correct person using two identifiers.  Patient Location: Home  Provider Location: Office/Clinic  I discussed the limitations of evaluation and management by telemedicine. The patient expressed understanding and agreed to proceed.  Subjective:   Troy [name] is a 72 y.o. male who presents for Medicare Annual/Subsequent preventive examination.  Review of Systems     Cardiac Risk Factors include: advanced age (>63mn, >>72women);male gender;dyslipidemia     Objective:    Today's Vitals   12/21/22 0802  Weight: 197 lb (89.4 kg)   Body mass index is 28.27 kg/m.     12/21/2022    8:05 AM 12/08/2021    8:01 AM 06/15/2021    5:55 PM 12/24/2019    3:41 PM 10/04/2016    4:21 PM 12/29/2015    7:21 AM 12/22/2015    1:53 PM  Advanced Directives  Does Patient Have a Medical Advance Directive? Yes Yes No No Yes Yes Yes  Type of AParamedicof ACrockerLiving will Healthcare Power of ADutchessLiving will [place]iteLiving will HRochesterLiving will  Does patient want to make changes to medical advance directive?     Yes - information given    Copy of HRomeovillein Chart? No - copy requested No - copy requested   No - copy requested    Would patient like information on creating a medical advance directive?    No - Patient declined       Current Medications (verified) Outpatient Encounter Medications as of 12/21/2022  Medication Sig   atorvastatin (LIPITOR) 10 MG tablet Take 1 tablet (10 mg total) by mouth every other day.   finasteride (PROSCAR) 5 MG tablet Take 1 tablet (5 mg total) by mouth daily.   ibuprofen (ADVIL) 100 MG tablet Take by mouth.   Multiple Vitamin (MULTIVITAMIN) capsule Take by mouth.   sildenafil (REVATIO) 20 MG tablet Take 2 to 5 tablets  Daily as needed   No facility-administered encounter medications on file as of 12/21/2022.    Allergies (verified) Patient has no known allergies.   History: Past Medical History:  Diagnosis Date   BPH (benign prostatic hyperplasia)    Past Surgical History:  Procedure Laterality Date   COLONOSCOPY  11/24/2005   [facility]-TICS ONLY   EYE SURGERY Bilateral 11/23/2003   lasik   JOINT REPLACEMENT Left 11/22/2005   Lt THR - Dr LRonnie Derby  POLYPECTOMY  2017   TA x 2   Family History  Problem Relation Age of Onset   Heart disease Father    Cancer Brother        Lung Cancer   Colon cancer Neg Hx    Colon polyps Neg Hx    Esophageal cancer Neg Hx    Rectal cancer Neg Hx    Stomach cancer Neg Hx    Prostate cancer Neg Hx    Social History   Socioeconomic History   Marital status: Married    Spouse name: Not on file   Number of children: Not on file   Years of education: Not on file   Highest education level: Not on file  Occupational History   Not on file  Tobacco Use   Smoking status: Never   Smokeless tobacco: Never  Vaping Use   Vaping Use: Never used  Substance and Sexual Activity   Alcohol use: Yes    Comment: rarely   Drug use: Never   Sexual activity: Yes  Other Topics Concern   Not on file  Social History Narrative   Not on file   Social Determinants of Health   Financial Resource Strain: Low Risk  (12/08/2021)   Overall Financial Resource Strain (CARDIA)    Difficulty of Paying Living Expenses: Not hard at all  Food Insecurity: No Food Insecurity (12/08/2021)   Hunger Vital Sign    Worried About Running Out of Food in the Last Year: Never true    Ran Out of Food in the Last Year: Never true  Transportation Needs: No Transportation Needs (12/08/2021)   PRAPARE - Hydrologist (Medical): No    Lack of Transportation (Non-Medical): No  Physical Activity: Sufficiently Active (12/08/2021)   Exercise Vital Sign    Days of Exercise  per Week: 7 days    Minutes of Exercise per Session: 60 min  Stress: No Stress Concern Present (12/08/2021)   [place]    Feeling of Stress : Not at all  Social Connections: Unknown (12/17/2022)   Social Connection and Isolation Panel [NHANES]    Frequency of Communication with Friends and Family: Not on file    Frequency of Social Gatherings with Friends and Family: Not on file    Attends Religious Services: Never    Marine scientist or Organizations: Not on file    Attends Archivist Meetings: Not on file    Marital Status: Not on file    Tobacco Counseling Counseling given: Not Answered   Clinical Intake:  Pre-visit preparation completed: Yes  Pain : No/denies pain     BMI - recorded: 28.27 Nutritional Status: BMI 25 -29 Overweight Nutritional Risks: None Diabetes: No  How often do you need to have someone help you when you read instructions, pamphlets, or other written materials from your doctor or pharmacy?: 1 - Never  Diabetic?no  Interpreter Needed?: No  Information entered by :: Charlott Rakes, LPN   Activities of Daily Living    12/17/2022    5:32 PM  In your present state of health, do you have any difficulty performing the following activities:  Hearing? 0  Vision? 0  Difficulty concentrating or making decisions? 0  Walking or climbing stairs? 0  Dressing or bathing? 0  Doing errands, shopping? 0  Preparing Food and eating ? N  Using the Toilet? N  In the past six months, have you accidently leaked urine? N  Do you have problems with loss of bowel control? N  Managing your Medications? N  Managing your Finances? N  Housekeeping or managing your Housekeeping? N    Patient Care Team: Vivi Barrack, MD as PCP - General (Family Medicine)  Indicate any recent Medical Services you may have received from other than [facility] providers in the past year (date may be  approximate).     Assessment:   This is a routine wellness examination for Troy [name].  Hearing/Vision screen Hearing Screening - Comments:: Pt denies any hearing issues  Vision Screening - Comments:: Pt follows up with Dr Darrel Reach for annual eye exam  Dietary issues and exercise activities discussed: Current Exercise Habits: Home exercise routine, Type of exercise: Other - see comments, Time (Minutes): 60, Frequency (Times/Week): 5, Weekly Exercise (Minutes/Week): 300   Goals Addressed  This Visit's Progress    Patient Stated       Continue to exercise and lose weight        Depression Screen    12/21/2022    8:04 AM 11/24/2022    9:55 AM 06/11/2022   11:33 AM 12/08/2021    8:00 AM 11/18/2021    9:56 AM 11/12/2020   10:57 AM 11/05/2019    1:14 PM  PHQ 2/9 Scores  PHQ - 2 Score 0 0 0 0 0 0 0    Fall Risk    12/17/2022    5:32 PM 11/24/2022    9:55 AM 06/11/2022   11:33 AM 12/08/2021    8:03 AM 11/18/2021    9:55 AM  Fall Risk   Falls in the past year? 0 0 0 0 0  Number falls in past yr: 0 0 0 0 0  Injury with Fall? 0 0 0 0 0  Risk for fall due to : History of fall(s) No Fall Risks No Fall Risks    Follow up Falls prevention discussed   Falls prevention discussed     FALL RISK PREVENTION PERTAINING TO THE HOME:  Any stairs in or around the home? Yes  If so, are there any without handrails? No  Home free of loose throw rugs in walkways, pet beds, electrical cords, etc? Yes  Adequate lighting in your home to reduce risk of falls? Yes   ASSISTIVE DEVICES UTILIZED TO PREVENT FALLS:  Life alert? No  Use of a cane, walker or w/c? No  Grab bars in the bathroom? Yes  Shower chair or bench in shower? Yes  Elevated toilet seat or a handicapped toilet? No   TIMED UP AND GO:  Was the test performed? No .   Cognitive Function:        12/21/2022    8:08 AM 12/08/2021    8:04 AM  6CIT Screen  What Year? 0 points 0 points  What month? 0 points 0 points   What time? 0 points 0 points  Count back from 20 0 points 0 points  Months in reverse 0 points 0 points  Repeat phrase 0 points 0 points  Total Score 0 points 0 points    Immunizations Immunization History  Administered Date(s) Administered   COVID-19, mRNA, vaccine(Comirnaty)12 years and older 09/28/2022   Fluad Quad(high Dose 65+) 08/16/2020, 08/14/2022   Influenza Split 10/30/2012, 08/20/2014, 08/25/2015   Influenza Whole 10/22/2005, 07/19/2010, 08/04/2011   Influenza, High Dose Seasonal PF 09/20/2016, 10/20/2017, 08/30/2018, 08/06/2019   Influenza-Unspecified 09/06/2018, 10/26/2021   PFIZER(Purple Top)SARS-COV-2 Vaccination 12/29/2019, 01/23/2020, 08/28/2020, 03/16/2021   PPD Test 08/20/2014, 08/25/2015   Pfizer Covid-19 Vaccine Bivalent Booster 93yr & up 10/14/2021   Pneumococcal Conjugate-13 10/04/2016   Pneumococcal Polysaccharide-23 10/31/2018   Rsv, Bivalent, Protein Subunit Rsvpref,pf (Evans Lance 08/18/2022   Td 11/22/1998, 07/15/2009   Tdap 11/05/2019   Zoster Recombinat (Shingrix) 11/18/2021, 01/20/2022   Zoster, Live 08/14/2013    TDAP status: Up to date  Flu Vaccine status: Up to date  Pneumococcal vaccine status: Up to date  Covid-19 vaccine status: Completed vaccines  Qualifies for Shingles Vaccine? Yes   Zostavax completed Yes   Shingrix Completed?: Yes  Screening Tests Health Maintenance  Topic Date Due   Medicare Annual Wellness (AWV)  12/22/2023   COLONOSCOPY (Pts 45-452yrInsurance coverage will need to be confirmed)  10/19/2028   DTaP/Tdap/Td (4 - Td or Tdap) 11/04/2029   Pneumonia Vaccine 6551Years old  Completed  INFLUENZA VACCINE  Completed   COVID-19 Vaccine  Completed   Hepatitis C Screening  Completed   Zoster Vaccines- Shingrix  Completed   HPV VACCINES  Aged Out    Health Maintenance  There are no preventive care reminders to display for this patient.   Colorectal cancer screening: Type of screening: Colonoscopy. Completed  10/19/21. Repeat every 7 years  Additional Screening:  Hepatitis C Screening:  Completed 08/20/14  Vision Screening: Recommended annual ophthalmology exams for early detection of glaucoma and other disorders of the eye. Is the patient up to date with their annual eye exam?  Yes  Who is the provider or what is the name of the office in which the patient attends annual eye exams? Dr Darrel Reach  If pt is not established with a provider, would they like to be referred to a provider to establish care? No .   Dental Screening: Recommended annual dental exams for proper oral hygiene  Community Resource Referral / Chronic Care Management: CRR required this visit?  No   CCM required this visit?  No      Plan:     I have personally reviewed and noted the following in the patient's chart:   Medical and social history Use of alcohol, tobacco or illicit drugs  Current medications and supplements including opioid prescriptions. Patient is not currently taking opioid prescriptions. Functional ability and status Nutritional status Physical activity Advanced directives List of other physicians Hospitalizations, surgeries, and ER visits in previous 12 months Vitals Screenings to include cognitive, depression, and falls Referrals and appointments  In addition, I have reviewed and discussed with patient certain preventive protocols, quality metrics, and best practice recommendations. A written personalized care plan for preventive services as well as general preventive health recommendations were provided to patient.     Willette Brace, LPN   01/02/1551   Nurse Notes: none

## 2022-12-21 NOTE — Patient Instructions (Signed)
[name] , Thank you for taking time to come for your Medicare Wellness Visit. I appreciate your ongoing commitment to your health goals. Please review the following plan we discussed and let me know if I can assist you in the future.   These are the goals we discussed:  Goals      Patient Stated     Keep exercising and eating smart      Patient Stated     Continue to exercise and lose weight         This is a list of the screening recommended for you and due dates:  Health Maintenance  Topic Date Due   Medicare Annual Wellness Visit  12/22/2023   Colon Cancer Screening  10/19/2028   DTaP/Tdap/Td vaccine (4 - Td or Tdap) 11/04/2029   Pneumonia Vaccine  Completed   Flu Shot  Completed   COVID-19 Vaccine  Completed   Hepatitis C Screening: USPSTF Recommendation to screen - Ages 98-79 yo.  Completed   Zoster (Shingles) Vaccine  Completed   HPV Vaccine  Aged Out    Advanced directives: Please bring a copy of your health care power of attorney and living will to the office at your convenience.  Conditions/risks identified: continue to exercise and lose weight   Next appointment: Follow up in one year for your annual wellness visit.   Preventive Care 75 Years and Older, Male  Preventive care refers to lifestyle choices and visits with your health care provider that can promote health and wellness. What does preventive care include? A yearly physical exam. This is also called an annual well check. Dental exams once or twice a year. Routine eye exams. Ask your health care provider how often you should have your eyes checked. Personal lifestyle choices, including: Daily care of your teeth and gums. Regular physical activity. Eating a healthy diet. Avoiding tobacco and drug use. Limiting alcohol use. Practicing safe sex. Taking low doses of aspirin every day. Taking vitamin and mineral supplements as recommended by your health care provider. What happens during an annual well  check? The services and screenings done by your health care provider during your annual well check will depend on your age, overall health, lifestyle risk factors, and family history of disease. Counseling  Your health care provider may ask you questions about your: Alcohol use. Tobacco use. Drug use. Emotional well-being. Home and relationship well-being. Sexual activity. Eating habits. History of falls. Memory and ability to understand (cognition). Work and work Statistician. Screening  You may have the following tests or measurements: Height, weight, and BMI. Blood pressure. Lipid and cholesterol levels. These may be checked every 5 years, or more frequently if you are over 30 years old. Skin check. Lung cancer screening. You may have this screening every year starting at age 73 if you have a 30-pack-year history of smoking and currently smoke or have quit within the past 15 years. Fecal occult blood test (FOBT) of the stool. You may have this test every year starting at age 67. Flexible sigmoidoscopy or colonoscopy. You may have a sigmoidoscopy every 5 years or a colonoscopy every 10 years starting at age 91. Prostate cancer screening. Recommendations will vary depending on your family history and other risks. Hepatitis C blood test. Hepatitis B blood test. Sexually transmitted disease (STD) testing. Diabetes screening. This is done by checking your blood sugar (glucose) after you have not eaten for a while (fasting). You may have this done every 1-3 years. Abdominal aortic  aneurysm (AAA) screening. You may need this if you are a current or former smoker. Osteoporosis. You may be screened starting at age 51 if you are at high risk. Talk with your health care provider about your test results, treatment options, and if necessary, the need for more tests. Vaccines  Your health care provider may recommend certain vaccines, such as: Influenza vaccine. This is recommended every  year. Tetanus, diphtheria, and acellular pertussis (Tdap, Td) vaccine. You may need a Td booster every 10 years. Zoster vaccine. You may need this after age 96. Pneumococcal 13-valent conjugate (PCV13) vaccine. One dose is recommended after age 65. Pneumococcal polysaccharide (PPSV23) vaccine. One dose is recommended after age 55. Talk to your health care provider about which screenings and vaccines you need and how often you need them. This information is not intended to replace advice given to you by your health care provider. Make sure you discuss any questions you have with your health care provider. Document Released: 12/05/2015 Document Revised: 07/28/2016 Document Reviewed: 09/09/2015 Elsevier Interactive Patient Education  2017 [place] Prevention in the Home Falls can cause injuries. They can happen to people of all ages. There are many things you can do to make your home safe and to help prevent falls. What can I do on the outside of my home? Regularly fix the edges of walkways and driveways and fix any cracks. Remove anything that might make you trip as you walk through a door, such as a raised step or threshold. Trim any bushes or trees on the path to your home. Use bright outdoor lighting. Clear any walking paths of anything that might make someone trip, such as rocks or tools. Regularly check to see if handrails are loose or broken. Make sure that both sides of any steps have handrails. Any raised decks and porches should have guardrails on the edges. Have any leaves, snow, or ice cleared regularly. Use sand or salt on walking paths during winter. Clean up any spills in your garage right away. This includes oil or grease spills. What can I do in the bathroom? Use night lights. Install grab bars by the toilet and in the tub and shower. Do not use towel bars as grab bars. Use non-skid mats or decals in the tub or shower. If you need to sit down in the shower, use a  plastic, non-slip stool. Keep the floor dry. Clean up any water that spills on the floor as soon as it happens. Remove soap buildup in the tub or shower regularly. Attach bath mats securely with double-sided non-slip rug tape. Do not have throw rugs and other things on the floor that can make you trip. What can I do in the bedroom? Use night lights. Make sure that you have a light by your bed that is easy to reach. Do not use any sheets or blankets that are too big for your bed. They should not hang down onto the floor. Have a firm chair that has side arms. You can use this for support while you get dressed. Do not have throw rugs and other things on the floor that can make you trip. What can I do in the kitchen? Clean up any spills right away. Avoid walking on wet floors. Keep items that you use a lot in easy-to-reach places. If you need to reach something above you, use a strong step stool that has a grab bar. Keep electrical cords out of the way. Do not use floor  polish or wax that makes floors slippery. If you must use wax, use non-skid floor wax. Do not have throw rugs and other things on the floor that can make you trip. What can I do with my stairs? Do not leave any items on the stairs. Make sure that there are handrails on both sides of the stairs and use them. Fix handrails that are broken or loose. Make sure that handrails are as long as the stairways. Check any carpeting to make sure that it is firmly attached to the stairs. Fix any carpet that is loose or worn. Avoid having throw rugs at the top or bottom of the stairs. If you do have throw rugs, attach them to the floor with carpet tape. Make sure that you have a light switch at the top of the stairs and the bottom of the stairs. If you do not have them, ask someone to add them for you. What else can I do to help prevent falls? Wear shoes that: Do not have high heels. Have rubber bottoms. Are comfortable and fit you  well. Are closed at the toe. Do not wear sandals. If you use a stepladder: Make sure that it is fully opened. Do not climb a closed stepladder. Make sure that both sides of the stepladder are locked into place. Ask someone to hold it for you, if possible. Clearly mark and make sure that you can see: Any grab bars or handrails. First and last steps. Where the edge of each step is. Use tools that help you move around (mobility aids) if they are needed. These include: Canes. Walkers. Scooters. Crutches. Turn on the lights when you go into a dark area. Replace any light bulbs as soon as they burn out. Set up your furniture so you have a clear path. Avoid moving your furniture around. If any of your floors are uneven, fix them. If there are any pets around you, be aware of where they are. Review your medicines with your doctor. Some medicines can make you feel dizzy. This can increase your chance of falling. Ask your doctor what other things that you can do to help prevent falls. This information is not intended to replace advice given to you by your health care provider. Make sure you discuss any questions you have with your health care provider. Document Released: 09/04/2009 Document Revised: 04/15/2016 Document Reviewed: 12/13/2014 Elsevier Interactive Patient Education  2017 [employer].

## 2022-12-24 ENCOUNTER — Encounter (HOSPITAL_BASED_OUTPATIENT_CLINIC_OR_DEPARTMENT_OTHER): Payer: Self-pay | Admitting: Emergency Medicine

## 2022-12-24 ENCOUNTER — Emergency Department (HOSPITAL_BASED_OUTPATIENT_CLINIC_OR_DEPARTMENT_OTHER)
Admission: EM | Admit: 2022-12-24 | Discharge: 2022-12-25 | Disposition: A | Discharge status: Home / Self Care | Payer: Medicare PPO | Attending: Emergency Medicine | Admitting: Emergency Medicine

## 2022-12-24 ENCOUNTER — Other Ambulatory Visit: Payer: Self-pay

## 2022-12-24 DIAGNOSIS — R509 Fever, unspecified: Secondary | ICD-10-CM | POA: Diagnosis present

## 2022-12-24 DIAGNOSIS — U071 COVID-19: Secondary | ICD-10-CM | POA: Insufficient documentation

## 2022-12-24 LAB — RESP PANEL BY RT-PCR (RSV, FLU A&B, COVID)  RVPGX2
Influenza A by PCR: NEGATIVE
Influenza B by PCR: NEGATIVE
Resp Syncytial Virus by PCR: NEGATIVE
SARS Coronavirus 2 by RT PCR: POSITIVE — AB

## 2022-12-24 MED ORDER — PAXLOVID (300/100) 20 X 150 MG & 10 X 100MG PO TBPK: 3.0000 | ORAL_TABLET | Freq: Two times a day (BID) | ORAL | 0 refills | Status: AC

## 2022-12-24 NOTE — ED Provider Notes (Signed)
[facility]  Provider Note  CSN: [id number] Arrival date & time: 12/24/22 2044  History Chief Complaint  Patient presents with   Fever    Troy [name] is a 72 y.o. male with no significant PMH reports onset of fever, general malaise, chest congestion earlier in the day today. No significant cough or SOB. No N/V/D, fever improved with Theraflu at home.    Home Medications Prior to Admission medications   Medication Sig Start Date End Date Taking? Authorizing Provider  nirmatrelvir & ritonavir (PAXLOVID, 300/100,) 20 x 150 MG & 10 x '100MG'$  TBPK Take 3 tablets by mouth 2 (two) times daily for 5 days. 12/24/22 12/29/22 Yes Truddie Hidden, MD  atorvastatin (LIPITOR) 10 MG tablet Take 1 tablet (10 mg total) by mouth every other day. 11/24/22 11/19/23  Vivi Barrack, MD  finasteride (PROSCAR) 5 MG tablet Take 1 tablet (5 mg total) by mouth daily. 11/24/22   Vivi Barrack, MD  ibuprofen (ADVIL) 100 MG tablet Take by mouth.    [provider]  Multiple Vitamin (MULTIVITAMIN) capsule Take by mouth.    [provider]  sildenafil (REVATIO) 20 MG tablet Take 2 to 5 tablets Daily as needed 11/24/22   Vivi Barrack, MD     Allergies    Patient has no known allergies.   Review of Systems   Review of Systems Please see HPI for pertinent positives and negatives  Physical Exam BP 133/87 (BP Location: Right Arm)   Pulse 78   Temp 98.3 F (36.8 C) (Oral)   Resp 18   SpO2 96%   Physical Exam Vitals and nursing note reviewed.  Constitutional:      Appearance: Normal appearance.  HENT:     Head: Normocephalic and atraumatic.     Nose: Nose normal.     Mouth/Throat:     Mouth: Mucous membranes are moist.  Eyes:     Extraocular Movements: Extraocular movements intact.     Conjunctiva/sclera: Conjunctivae normal.  Cardiovascular:     Rate and Rhythm: Normal rate.  Pulmonary:     Effort: Pulmonary effort is normal.      Breath sounds: Normal breath sounds.  Abdominal:     General: Abdomen is flat.     Palpations: Abdomen is soft.     Tenderness: There is no abdominal tenderness.  Musculoskeletal:        General: No swelling. Normal range of motion.     Cervical back: Neck supple.  Skin:    General: Skin is warm and dry.  Neurological:     General: No focal deficit present.     Mental Status: He is alert.  Psychiatric:        Mood and Affect: Mood normal.     ED Results / Procedures / Treatments   EKG None  Procedures Procedures  Medications Ordered in the ED Medications - No data to display  Initial Impression and Plan  Patient here with URI symptoms, Covid is positive. Given age will Rx Paxlovid, standard quarantine and return instructions discussed.   ED Course       MDM Rules/Calculators/A&P Medical Decision Making Problems Addressed: COVID-19: acute illness or injury  Amount and/or Complexity of Data Reviewed Labs: ordered. Decision-making details documented in ED Course.  Risk Prescription drug management.     Final Clinical Impression(s) / ED Diagnoses Final diagnoses:  [id number]    Rx / DC Orders ED Discharge Orders  Ordered    nirmatrelvir & ritonavir (PAXLOVID, 300/100,) 20 x 150 MG & 10 x '100MG'$  TBPK  2 times daily        12/24/22 2340             Truddie Hidden, MD 12/24/22 2340

## 2022-12-24 NOTE — ED Triage Notes (Signed)
Fever chills and chest congestion Started this morning. Took theraflu around 7pm

## 2022-12-24 NOTE — ED Triage Notes (Addendum)
error 

## 2022-12-27 ENCOUNTER — Telehealth: Payer: Self-pay | Admitting: Family Medicine

## 2022-12-27 NOTE — Telephone Encounter (Signed)
Pt was inquiring as to why his Mychart stated he was negative for Covid. Access nurse explained to pt.   Patient Name: Troy [name] [place] Gender: Male DOB: December 05, 1950 Age: 72 Y 3 D Return Phone Number: [phone] (Primary), [phone] (Secondary) Address: City/ State/ Zip: [place]  [address] Client [facility] Client Site [place] [facility] [place] Night Provider Dimas Chyle- MD Contact Type Call Who Is Calling Patient / Member / Family / Caregiver Call Type Triage / Clinical Relationship To Patient Self Return Phone Number [phone] (Primary) Chief Complaint Fever (non-urgent symptom) (greater than THREE MONTHS old) Reason for Call Symptomatic / Request for [employer] says that he went to the ER last night and was positive for COVID. But when he saw MyChart, it says he was negative, and wants to clarify. His temp is 99, he has a runny nose, and congestion. Translation No Nurse Assessment Nurse: Ria Comment, RN, Verdis Frederickson Date/Time Eilene Ghazi Time): 12/25/2022 3:47:38 PM Confirm and document reason for call. If symptomatic, describe symptoms. ---Caller states that he went to the ED last night and was positive for COVID. But when he saw his MyChart results it says: SARS Coronavirus PCR states negative and wants to make sure what the results are. Pt was prescribed Paxlovid. Does the patient have any new or worsening symptoms? ---No Please document clinical information provided and list any resource used. ---After speaking with pt's wife, it was clarified that the negative result was a display of normal lab values while pt's COVID test result was positive and abnormal as written in MyChart. Caller verbalized understanding. Disp. Time Eilene Ghazi Time) Disposition Final User 12/25/2022 3:34:28 PM Attempt made - message left Janalyn Shy 12/25/2022 3:55:25 PM Clinical Call Yes Ria Comment, RN,  [facility] Final Disposition 12/25/2022 3:55:25 PM Clinical Call Yes Ria Comment, RN, Verdis Frederickson

## 2022-12-27 NOTE — Telephone Encounter (Signed)
Spoke with patient, stated taking medication and feeling better  No question about test results

## 2022-12-27 NOTE — Telephone Encounter (Signed)
Pt was seen in ED on 12/24/22  Patient Name: Troy [name] Gender: Male DOB: 11-Mar-1951 Age: 72 Y 3 D Return Phone Number: [phone] (Primary) Address: City/ State/ Zip: [place]  [address] Client [facility] [place] Client Site [place] [facility] [place] Night Provider Dimas Chyle- MD Contact Type Call Who Is Calling Patient / Member / Family / Caregiver Call Type Triage / Clinical Caller Name Demontay Grantham Relationship To Patient Spouse Return Phone Number [phone] (Primary) Chief Complaint FEVER - greater than or equal to 104 or less than 97 Reason for Call Symptomatic / Request for Health Information Initial Comment Patient had a cold this morning, or so they thought. He has a temp of 105. Translation No Nurse Assessment Nurse: Marcello Moores, RN, Cheri Date/Time (Eastern Time): 12/24/2022 7:56:46 PM Confirm and document reason for call. If symptomatic, describe symptoms. ---Caller states patient has had cold symptoms and now has a fever 105.0 by forehead. Pt has sinus congestion. States no known exposures. No chest pain, no difficulty breathing, no cough. Does the patient have any new or worsening symptoms? ---Yes Will a triage be completed? ---Yes Related visit to physician within the last 2 weeks? ---No Does the PT have any chronic conditions? (i.e. diabetes, asthma, this includes High risk factors for pregnancy, etc.) ---Yes List chronic conditions. ---cholesterol, BPH Is this a behavioral health or substance abuse call? ---No Guidelines Guideline Title Affirmed Question Affirmed Notes Nurse Date/Time Eilene Ghazi Time) Sinus Pain or Congestion [1] Redness or swelling on the cheek, forehead or around the eye AND [2] fever Marcello Moores, RN, Cheri 12/24/2022 8:01:03 PM Disp. Time Eilene Ghazi Time) Disposition Final User 12/24/2022 7:55:19 PM Send to Urgent Kathalene Frames, [place] 12/24/2022 8:04:27 PM Go to ED Now (or PCP  triage) Yes Marcello Moores, RN, Cheri Final Disposition 12/24/2022 8:04:27 PM Go to ED Now (or PCP triage) Yes Marcello Moores, RN, Cheri Caller Disagree/Comply Comply Caller Understands Yes PreDisposition Call another nurse Care Advice Given Per Guideline GO TO ED NOW (OR PCP TRIAGE): * IF NO PCP (PRIMARY CARE PROVIDER) SECOND-LEVEL TRIAGE: You need to be seen within the next hour. Go to the [place] at _____________ [place] as soon as you can. ANOTHER ADULT SHOULD DRIVE: * It is better and safer if another adult drives instead of you. BRING MEDICINES: * Please bring a list of your current medicines when you go to see the doctor. CARE ADVICE given per Sinus Pain or Congestion (Adult) guideline. [place] - ED

## 2022-12-30 NOTE — Progress Notes (Signed)
I connected with  Troy [name] on 12/30/22 by a audio enabled telemedicine application and verified that I am speaking with the correct person using two identifiers.  Patient Location: Home  Provider Location: Office/Clinic  I discussed the limitations of evaluation and management by telemedicine. The patient expressed understanding and agreed to proceed    Patient Medicare AWV questionnaire was completed by the patient on 12/17/22; I have confirmed that all information answered by patient is correct and no changes since this date.          .  Subjective:   Troy [name] is a 72 y.o. male who presents for Medicare Annual/Subsequent preventive examination.  Review of Systems     Cardiac Risk Factors include: advanced age (>45mn, >>31women);male gender;dyslipidemia     Objective:    Today's Vitals   12/21/22 0802  Weight: 197 lb (89.4 kg)   Body mass index is 28.27 kg/m.     12/21/2022    8:05 AM 12/08/2021    8:01 AM 06/15/2021    5:55 PM 12/24/2019    3:41 PM 10/04/2016    4:21 PM 12/29/2015    7:21 AM 12/22/2015    1:53 PM  Advanced Directives  Does Patient Have a Medical Advance Directive? Yes Yes No No Yes Yes Yes  Type of AParamedicof ABrice PrairieLiving will Healthcare Power of [place]Living will [place]Living will [place] will  Does patient want to make changes to medical advance directive?     Yes - information given    Copy of [place] Chart? No - copy requested No - copy requested   No - copy requested    Would patient like information on creating a medical advance directive?    No - Patient declined       Current Medications (verified) Outpatient Encounter Medications as of 12/21/2022  Medication Sig   atorvastatin (LIPITOR) 10 MG tablet Take 1 tablet (10 mg total) by mouth every other day.   finasteride (PROSCAR) 5 MG tablet Take 1  tablet (5 mg total) by mouth daily.   ibuprofen (ADVIL) 100 MG tablet Take by mouth.   Multiple Vitamin (MULTIVITAMIN) capsule Take by mouth.   sildenafil (REVATIO) 20 MG tablet Take 2 to 5 tablets Daily as needed   No facility-administered encounter medications on file as of 12/21/2022.    Allergies (verified) Patient has no known allergies.   History: Past Medical History:  Diagnosis Date   BPH (benign prostatic hyperplasia)    Past Surgical History:  Procedure Laterality Date   COLONOSCOPY  11/24/2005   STARK-TICS ONLY   EYE SURGERY Bilateral 11/23/2003   lasik   JOINT REPLACEMENT Left 11/22/2005   Lt THR - Dr LRonnie Derby  POLYPECTOMY  2017   TA x 2   Family History  Problem Relation Age of Onset   Heart disease Father    Cancer Brother        Lung Cancer   Colon cancer Neg Hx    Colon polyps Neg Hx    Esophageal cancer Neg Hx    Rectal cancer Neg Hx    Stomach cancer Neg Hx    Prostate cancer Neg Hx    Social History   Socioeconomic History   Marital status: Married    Spouse name: Not on file   Number of children: Not on file   Years of education: Not  on file   Highest education level: Not on file  Occupational History   Not on file  Tobacco Use   Smoking status: Never   Smokeless tobacco: Never  Vaping Use   Vaping Use: Never used  Substance and Sexual Activity   Alcohol use: Yes    Comment: rarely   Drug use: Never   Sexual activity: Yes  Other Topics Concern   Not on file  Social History Narrative   Not on file   Social Determinants of Health   Financial Resource Strain: Low Risk  (12/17/2022)   Overall Financial Resource Strain (CARDIA)    Difficulty of Paying Living Expenses: Not hard at all  Food Insecurity: No Food Insecurity (12/17/2022)   Hunger Vital Sign    Worried About Running Out of Food in the Last Year: Never true    Ran Out of Food in the Last Year: Never true  Transportation Needs: No Transportation Needs (12/17/2022)   PRAPARE -  Hydrologist (Medical): No    Lack of Transportation (Non-Medical): No  Physical Activity: Sufficiently Active (12/17/2022)   Exercise Vital Sign    Days of Exercise per Week: 7 days    Minutes of Exercise per Session: 60 min  Stress: No Stress Concern Present (12/17/2022)   [place]    Feeling of Stress : Not at all  Social Connections: Moderately Integrated (12/17/2022)   Social Connection and Isolation Panel [NHANES]    Frequency of Communication with Friends and Family: More than three times a week    Frequency of Social Gatherings with Friends and Family: More than three times a week    Attends Religious Services: Never    Marine scientist or Organizations: Yes    Attends Music therapist: 1 to 4 times per year    Marital Status: Married    Tobacco Counseling Counseling given: Not Answered   Clinical Intake:  Pre-visit preparation completed: Yes  Pain : No/denies pain     BMI - recorded: 28.27 Nutritional Status: BMI 25 -29 Overweight Nutritional Risks: None Diabetes: No  How often do you need to have someone help you when you read instructions, pamphlets, or other written materials from your doctor or pharmacy?: 1 - Never  Diabetic?no  Interpreter Needed?: No  Information entered by :: Charlott Rakes, LPN   Activities of Daily Living    12/17/2022    5:32 PM  In your present state of health, do you have any difficulty performing the following activities:  Hearing? 0  Vision? 0  Difficulty concentrating or making decisions? 0  Walking or climbing stairs? 0  Dressing or bathing? 0  Doing errands, shopping? 0  Preparing Food and eating ? N  Using the Toilet? N  In the past six months, have you accidently leaked urine? N  Do you have problems with loss of bowel control? N  Managing your Medications? N  Managing your Finances? N  Housekeeping  or managing your Housekeeping? N    Patient Care Team: Vivi Barrack, MD as PCP - General (Family Medicine)  Indicate any recent Medical Services you may have received from other than [facility] providers in the past year (date may be approximate).     Assessment:   This is a routine wellness examination for Troy [name].  Hearing/Vision screen Hearing Screening - Comments:: Pt denies any hearing issues  Vision Screening - Comments:: Pt follows up  with Dr Darrel Reach for annual eye exam  Dietary issues and exercise activities discussed: Current Exercise Habits: Home exercise routine, Type of exercise: Other - see comments, Time (Minutes): 60, Frequency (Times/Week): 5, Weekly Exercise (Minutes/Week): 300   Goals Addressed             This Visit's Progress    Patient Stated       Continue to exercise and lose weight       Depression Screen    12/21/2022    8:04 AM 11/24/2022    9:55 AM 06/11/2022   11:33 AM 12/08/2021    8:00 AM 11/18/2021    9:56 AM 11/12/2020   10:57 AM 11/05/2019    1:14 PM  PHQ 2/9 Scores  PHQ - 2 Score 0 0 0 0 0 0 0    Fall Risk    12/17/2022    5:32 PM 11/24/2022    9:55 AM 06/11/2022   11:33 AM 12/08/2021    8:03 AM 11/18/2021    9:55 AM  Fall Risk   Falls in the past year? 0 0 0 0 0  Number falls in past yr: 0 0 0 0 0  Injury with Fall? 0 0 0 0 0  Risk for fall due to : History of fall(s) No Fall Risks No Fall Risks    Follow up Falls prevention discussed   Falls prevention discussed     FALL RISK PREVENTION PERTAINING TO THE HOME:  Any stairs in or around the home? Yes  If so, are there any without handrails? No  Home free of loose throw rugs in walkways, pet beds, electrical cords, etc? Yes  Adequate lighting in your home to reduce risk of falls? Yes   ASSISTIVE DEVICES UTILIZED TO PREVENT FALLS:  Life alert? No  Use of a cane, walker or w/c? No  Grab bars in the bathroom? Yes  Shower chair or bench in shower? Yes  Elevated toilet seat or a  handicapped toilet? No   TIMED UP AND GO:  Was the test performed? No .   Cognitive Function:        12/21/2022    8:08 AM 12/08/2021    8:04 AM  6CIT Screen  What Year? 0 points 0 points  What month? 0 points 0 points  What time? 0 points 0 points  Count back from 20 0 points 0 points  Months in reverse 0 points 0 points  Repeat phrase 0 points 0 points  Total Score 0 points 0 points    Immunizations Immunization History  Administered Date(s) Administered   COVID-19, mRNA, vaccine(Comirnaty)12 years and older 09/28/2022   Fluad Quad(high Dose 65+) 08/16/2020, 08/14/2022   Influenza Split 10/30/2012, 08/20/2014, 08/25/2015   Influenza Whole 10/22/2005, 07/19/2010, 08/04/2011   Influenza, High Dose Seasonal PF 09/20/2016, 10/20/2017, 08/30/2018, 08/06/2019   Influenza-Unspecified 09/06/2018, 10/26/2021   PFIZER(Purple Top)SARS-COV-2 Vaccination 12/29/2019, 01/23/2020, 08/28/2020, 03/16/2021   PPD Test 08/20/2014, 08/25/2015   Pfizer Covid-19 Vaccine Bivalent Booster 72yr & up 10/14/2021   Pneumococcal Conjugate-13 10/04/2016   Pneumococcal Polysaccharide-23 10/31/2018   Rsv, Bivalent, Protein Subunit Rsvpref,pf (Evans Lance 08/18/2022   Td 11/22/1998, 07/15/2009   Tdap 11/05/2019   Zoster Recombinat (Shingrix) 11/18/2021, 01/20/2022   Zoster, Live 08/14/2013    TDAP status: Up to date  Flu Vaccine status: Up to date  Pneumococcal vaccine status: Up to date  Covid-19 vaccine status: Completed vaccines  Qualifies for Shingles Vaccine? Yes   Zostavax completed Yes   Shingrix Completed?:  Yes  Screening Tests Health Maintenance  Topic Date Due   Medicare Annual Wellness (AWV)  12/22/2023   COLONOSCOPY (Pts 45-44yr Insurance coverage will need to be confirmed)  10/19/2028   DTaP/Tdap/Td (4 - Td or Tdap) 11/04/2029   Pneumonia Vaccine 72 Years old  Completed   INFLUENZA VACCINE  Completed   COVID-19 Vaccine  Completed   Hepatitis C Screening  Completed   Zoster  Vaccines- Shingrix  Completed   HPV VACCINES  Aged Out    Health Maintenance  There are no preventive care reminders to display for this patient.   Colorectal cancer screening: Type of screening: Colonoscopy. Completed 10/19/21. Repeat every 7 years  Additional Screening:  Hepatitis C Screening:  Completed 08/20/14  Vision Screening: Recommended annual ophthalmology exams for early detection of glaucoma and other disorders of the eye. Is the patient up to date with their annual eye exam?  Yes  Who is the provider or what is the name of the office in which the patient attends annual eye exams? Dr GDarrel Reach If pt is not established with a provider, would they like to be referred to a provider to establish care? No .   Dental Screening: Recommended annual dental exams for proper oral hygiene  Community Resource Referral / Chronic Care Management: CRR required this visit?  No   CCM required this visit?  No      Plan:     I have personally reviewed and noted the following in the patient's chart:   Medical and social history Use of alcohol, tobacco or illicit drugs  Current medications and supplements including opioid prescriptions. Patient is not currently taking opioid prescriptions. Functional ability and status Nutritional status Physical activity Advanced directives List of other physicians Hospitalizations, surgeries, and ER visits in previous 12 months Vitals Screenings to include cognitive, depression, and falls Referrals and appointments  In addition, I have reviewed and discussed with patient certain preventive protocols, quality metrics, and best practice recommendations. A written personalized care plan for preventive services as well as general preventive health recommendations were provided to patient.     TWillette Brace LPN   [id number]  Nurse Notes: none

## 2023-01-12 DIAGNOSIS — C44311 Basal cell carcinoma of skin of nose: Secondary | ICD-10-CM | POA: Diagnosis not present

## 2023-01-12 DIAGNOSIS — Z85828 Personal history of other malignant neoplasm of skin: Secondary | ICD-10-CM | POA: Diagnosis not present

## 2023-03-02 DIAGNOSIS — Z85828 Personal history of other malignant neoplasm of skin: Secondary | ICD-10-CM | POA: Diagnosis not present

## 2023-03-02 DIAGNOSIS — L570 Actinic keratosis: Secondary | ICD-10-CM | POA: Diagnosis not present

## 2023-03-02 DIAGNOSIS — L569 Acute skin change due to ultraviolet radiation, unspecified: Secondary | ICD-10-CM | POA: Diagnosis not present

## 2023-03-07 ENCOUNTER — Ambulatory Visit: Payer: Medicare PPO | Admitting: Family Medicine

## 2023-03-09 ENCOUNTER — Ambulatory Visit: Payer: Medicare PPO | Admitting: Family Medicine

## 2023-03-09 ENCOUNTER — Encounter: Payer: Self-pay | Admitting: Family Medicine

## 2023-03-09 VITALS — BP 160/86 | HR 67 | Temp 98.1°F | Resp 16 | Ht 70.0 in | Wt 197.8 lb

## 2023-03-09 DIAGNOSIS — R030 Elevated blood-pressure reading, without diagnosis of hypertension: Secondary | ICD-10-CM | POA: Diagnosis not present

## 2023-03-09 DIAGNOSIS — E785 Hyperlipidemia, unspecified: Secondary | ICD-10-CM | POA: Diagnosis not present

## 2023-03-09 DIAGNOSIS — H6123 Impacted cerumen, bilateral: Secondary | ICD-10-CM

## 2023-03-09 NOTE — Patient Instructions (Signed)
It was very nice to see you today!  We removed earwax today.  You can use hydrogen peroxide as needed to prevent buildup.  Please let us know if you [place] to see the ear nose and throat doctor.  Take care, Dr Jimmey Ralph  PLEASE NOTE:  If you had any lab tests, please let us know if you have not heard back within a few days. You may see your results on mychart before we have a chance to review them but we will give you a call once they are reviewed by [place].   If we ordered any referrals today, please let us know if you have not heard from their office within the next week.   If you had any urgent prescriptions sent in today, please check with the pharmacy within an hour of our visit to make sure the prescription was transmitted appropriately.   Please try these tips to maintain a healthy lifestyle:  Eat at least 3 REAL meals and 1-2 snacks per day.  Aim for no more than 5 hours between eating.  If you eat breakfast, please do so within one hour of getting up.   Each meal should contain half fruits/vegetables, one quarter protein, and one quarter carbs (no bigger than a computer mouse)  Cut down on sweet beverages. This includes juice, soda, and sweet tea.   Drink at least 1 glass of water with each meal and aim for at least 8 glasses per day  Exercise at least 150 minutes every week.

## 2023-03-09 NOTE — Assessment & Plan Note (Signed)
Doing well Lipitor 10 mg every other day.  Last LDL 71.  Check lipids next CPE.

## 2023-03-09 NOTE — Assessment & Plan Note (Signed)
Has whitecoat hypertension.  Home blood pressure readings are at goal.  Blood pressure improved by 20 points on recheck.  If still above goal.  He will continue to monitor at home and let us know if persistently elevated.

## 2023-03-09 NOTE — Progress Notes (Signed)
   Troy [name] is a 72 y.o. male who presents today for an office visit.  Assessment/Plan:  New/Acute Problems: Cerumen Impaction  Cerumen removed today via irrigation followed by manual removal with combination of forceps and curettage.  He tolerated well.  See below procedure note.  He can use hydrogen peroxide as needed to prevent buildup in the future.  We did discuss referral to ENT for ongoing management however he would like to hold off on this for now.  He will let us know if he would like referral between now and her next visit.  Chronic Problems Addressed Today: Elevated blood pressure reading Has whitecoat hypertension.  Home blood pressure readings are at goal.  Blood pressure improved by 20 points on recheck.  If still above goal.  He will continue to monitor at home and let us know if persistently elevated.  Dyslipidemia Doing well Lipitor 10 mg every other day.  Last LDL 71.  Check lipids next CPE.     Subjective:  HPI:  See A/p for status of chronic conditions.  Main concern today is earwax build up. Thinks this happened several weeks ago. Was previously seeing ENT once per year or so for clean out.  Is having issues in both ears.  He has not seen ENT since they previously retired.       Objective:  Physical Exam: BP (!) 160/86   Pulse 67   Temp 98.1 F (36.7 C) (Temporal)   Resp 16   Ht  (1.778 m)   Wt 197 lb 12.8 oz (89.7 kg)   SpO2 97%   BMI 28.38 kg/m   Gen: No acute distress, resting comfortably HEENT: Cerumen impaction bilaterally impacting hearing. CV: Regular rate and rhythm with no murmurs appreciated Pulm: Normal work of breathing, clear to auscultation bilaterally with no crackles, wheezes, or rhonchi Neuro: Grossly normal, moves all extremities Psych: Normal affect and thought content  Procedure note Verbal consent obtained.  Bilateral ears were irrigated with warm solution of water and hydrogen peroxide.  About 70% of cerumen was able  to be removed via this method.  The remainder the cerumen was removed using forceps and curette.  He tolerated well.  No blood loss.  No pain.      Katina Degree. Jimmey Ralph, MD 03/09/2023 10:25 AM

## 2023-03-20 ENCOUNTER — Encounter: Payer: Self-pay | Admitting: Family Medicine

## 2023-03-21 NOTE — Telephone Encounter (Signed)
Patient called stating that BP has been going down lately but he still would like medication to keep controlled.

## 2023-03-22 NOTE — Telephone Encounter (Signed)
The best things he can do aside from medications to help with blood pressure are diet and exercise. More potassium can help, but would not recommend any supplements due to lack of clear evidence of benefit. Reduction of alcohol and sodium intake in addition to getting at least moderate intensity cardio exercise 150-300 minutes per week are the best non-medication things he can do for his BP.   Troy [name]. Jimmey Ralph, MD 03/22/2023 7:44 AM

## 2023-03-23 NOTE — Telephone Encounter (Signed)
Spouse called stating that patient's BP is still becomign elevated to as high as 160/90. Currently systolic is 140 but caller can't remember what diastolic reading was. Caller is requesting to speak with PCP to see what could be recommended.

## 2023-03-23 NOTE — Telephone Encounter (Signed)
Please advise 

## 2023-03-24 ENCOUNTER — Encounter: Payer: Self-pay | Admitting: Family Medicine

## 2023-03-24 ENCOUNTER — Ambulatory Visit: Payer: Medicare PPO | Admitting: Family Medicine

## 2023-03-24 VITALS — BP 173/82 | HR 70 | Temp 97.5°F | Ht 70.0 in | Wt 192.8 lb

## 2023-03-24 DIAGNOSIS — I10 Essential (primary) hypertension: Secondary | ICD-10-CM | POA: Diagnosis not present

## 2023-03-24 DIAGNOSIS — E785 Hyperlipidemia, unspecified: Secondary | ICD-10-CM | POA: Diagnosis not present

## 2023-03-24 MED ORDER — ATORVASTATIN CALCIUM 10 MG PO TABS: 10.0000 mg | ORAL_TABLET | ORAL | 3 refills | Status: DC

## 2023-03-24 MED ORDER — AMLODIPINE BESYLATE 5 MG PO TABS: 5.0000 mg | ORAL_TABLET | Freq: Every day | ORAL | 3 refills | Status: DC

## 2023-03-24 NOTE — Patient Instructions (Signed)
It was very nice to see you today!  We will start amlodipine.  Please send me a message in a few weeks to let me know how your blood pressures are looking  Take care, Dr Jimmey Ralph  PLEASE NOTE:  If you had any lab tests, please let us know if you have not heard back within a few days. You may see your results on mychart before we have a chance to review them but we will give you a call once they are reviewed by [place].   If we ordered any referrals today, please let us know if you have not heard from their office within the next week.   If you had any urgent prescriptions sent in today, please check with the pharmacy within an hour of our visit to make sure the prescription was transmitted appropriately.   Please try these tips to maintain a healthy lifestyle:  Eat at least 3 REAL meals and 1-2 snacks per day.  Aim for no more than 5 hours between eating.  If you eat breakfast, please do so within one hour of getting up.   Each meal should contain half fruits/vegetables, one quarter protein, and one quarter carbs (no bigger than a computer mouse)  Cut down on sweet beverages. This includes juice, soda, and sweet tea.   Drink at least 1 glass of water with each meal and aim for at least 8 glasses per day  Exercise at least 150 minutes every week.

## 2023-03-24 NOTE — Progress Notes (Signed)
   Troy [name] is a 72 y.o. male who presents today for an office visit.  Assessment/Plan:  Chronic Problems Addressed Today: Essential hypertension Blood pressure initially elevated 143/81 today. On recheck it increase to 173/82.  His home readings have been elevated as well.  We discussed treatment options.  He has been trying lifestyle modifications without much improvement.  We will start amlodipine 5 mg daily.  They will monitor at home and follow-up with me in a couple of weeks.  We discussed potential side effects.  Dyslipidemia Doing well on Lipitor 10 mg every other day.  He will come back soon for CPE and we will recheck lipids at that time.     Subjective:  HPI:  See A/P for status of chronic conditions.  Patient is here today for follow-up.  We did see him a couple of weeks ago.  At that time he did have elevated blood pressure reading to 160/86.  He was monitoring at home and still is having elevations at home from the 140s to 160s over 90s.       Objective:  Physical Exam: BP (!) 173/82   Pulse 70   Temp (!) 97.5 F (36.4 C) (Temporal)   Ht 5\' 10"  (1.778 m)   Wt 192 lb 12.8 oz (87.5 kg)   SpO2 98%   BMI 27.66 kg/m   Gen: No acute distress, resting comfortably CV: Regular rate and rhythm with no murmurs appreciated Pulm: Normal work of breathing, clear to auscultation bilaterally with no crackles, wheezes, or rhonchi Neuro: Grossly normal, moves all extremities Psych: Normal affect and thought content      Troy Beaumont M. Jimmey Ralph, MD 03/24/2023 12:25 PM

## 2023-03-24 NOTE — Assessment & Plan Note (Signed)
Blood pressure initially elevated 143/81 today. On recheck it increase to 173/82.  His home readings have been elevated as well.  We discussed treatment options.  He has been trying lifestyle modifications without much improvement.  We will start amlodipine 5 mg daily.  They will monitor at home and follow-up with me in a couple of weeks.  We discussed potential side effects.

## 2023-03-24 NOTE — Assessment & Plan Note (Signed)
Doing well on Lipitor 10 mg every other day.  He will come back soon for CPE and we will recheck lipids at that time.

## 2023-03-24 NOTE — Telephone Encounter (Signed)
Discussed at office visit today.  Troy [name]. Jimmey Ralph, MD 03/24/2023 12:56 PM

## 2023-05-08 ENCOUNTER — Encounter: Payer: Self-pay | Admitting: Family Medicine

## 2023-05-09 NOTE — Telephone Encounter (Signed)
See note

## 2023-05-09 NOTE — Telephone Encounter (Signed)
I appreciate the update.  His blood pressure's are at goal.   The "perfect" blood pressure is 120/80 however anything up to 140/90 is acceptable.   If his averages are above 140/90 then at that point we would discuss medication treatment options however for now they can continue to do what they are doing.  He should make sure that he continues to work on diet and exercise.  Goal should treat for about 30 to 60 minutes of exercise daily.  He should also continue to work on salt avoidance.  They can continue to monitor at home and let us know if persistently elevated.

## 2023-05-18 ENCOUNTER — Encounter: Payer: Self-pay | Admitting: Family Medicine

## 2023-05-18 ENCOUNTER — Ambulatory Visit: Payer: Medicare PPO | Admitting: Family Medicine

## 2023-05-18 VITALS — BP 138/82 | HR 65 | Ht 70.0 in | Wt 190.4 lb

## 2023-05-18 DIAGNOSIS — H6123 Impacted cerumen, bilateral: Secondary | ICD-10-CM | POA: Diagnosis not present

## 2023-05-18 DIAGNOSIS — E785 Hyperlipidemia, unspecified: Secondary | ICD-10-CM

## 2023-05-18 DIAGNOSIS — I10 Essential (primary) hypertension: Secondary | ICD-10-CM | POA: Diagnosis not present

## 2023-05-18 MED ORDER — AMLODIPINE BESYLATE 5 MG PO TABS: 5.0000 mg | ORAL_TABLET | Freq: Every day | ORAL | 3 refills | Status: DC

## 2023-05-18 MED ORDER — ATORVASTATIN CALCIUM 10 MG PO TABS: 10.0000 mg | ORAL_TABLET | ORAL | 3 refills | Status: DC

## 2023-05-18 NOTE — Assessment & Plan Note (Signed)
Will refill Lipitor 10 mg every other day.  Recheck lipids at next office visit.

## 2023-05-18 NOTE — Assessment & Plan Note (Signed)
Blood pressure at goal on amlodipine 5 mg daily.  Home readings at goal.  He is working on lifestyle modifications.  Will continue amlodipine 5 mg daily and he will continue to monitor at home.

## 2023-05-18 NOTE — Patient Instructions (Signed)
It was very nice to see you today!  We flushed your ears today.  You can use Debrox or hydrogen peroxide as needed to prevent buildup.  I will refill your medications today.  Please let us know if you would like to have a referral to an audiologist.  Return if symptoms worsen or fail to improve.   Take care, Dr Jimmey Ralph  PLEASE NOTE:  If you had any lab tests, please let us know if you have not heard back within a few days. You may see your results on mychart before we have a chance to review them but we will give you a call once they are reviewed by [place].   If we ordered any referrals today, please let us know if you have not heard from their office within the next week.   If you had any urgent prescriptions sent in today, please check with the pharmacy within an hour of our visit to make sure the prescription was transmitted appropriately.   Please try these tips to maintain a healthy lifestyle:  Eat at least 3 REAL meals and 1-2 snacks per day.  Aim for no more than 5 hours between eating.  If you eat breakfast, please do so within one hour of getting up.   Each meal should contain half fruits/vegetables, one quarter protein, and one quarter carbs (no bigger than a computer mouse)  Cut down on sweet beverages. This includes juice, soda, and sweet tea.   Drink at least 1 glass of water with each meal and aim for at least 8 glasses per day  Exercise at least 150 minutes every week.

## 2023-05-18 NOTE — Progress Notes (Signed)
   Troy [name] is a 72 y.o. male who presents today for an office visit.  Assessment/Plan:  New/Acute Problems: Cerumen Impaction  Successfully irrigated by RN.  He can use over-the-counter hydrogen peroxide or Debrox as needed to prevent buildup.  He will let us know if he still has ongoing hearing issues and we can refer to audiology.  Chronic Problems Addressed Today: Essential hypertension Blood pressure at goal on amlodipine 5 mg daily.  Home readings at goal.  He is working on lifestyle modifications.  Will continue amlodipine 5 mg daily and he will continue to monitor at home.  Dyslipidemia Will refill Lipitor 10 mg every other day.  Recheck lipids at next office visit.    Subjective:  HPI:  See Assessment / plan for status of chronic conditions.  His main concern today is cerumen impaction. His wife is concerned that he is not hearing clearly. This started several days ago. He has tried using hydrogen peroxide without much improvement. .        Objective:  Physical Exam: BP 138/82   Pulse 65   Ht 5\' 10"  (1.778 m)   Wt 190 lb 6.4 oz (86.4 kg)   SpO2 98%   BMI 27.32 kg/m   Wt Readings from Last 3 Encounters:  05/18/23 190 lb 6.4 oz (86.4 kg)  03/24/23 192 lb 12.8 oz (87.5 kg)  03/09/23 197 lb 12.8 oz (89.7 kg)   Gen: No acute distress, resting comfortably HEENT: Cerumen impaction bilaterally.  Clear after irrigation. CV: Regular rate and rhythm with no murmurs appreciated Pulm: Normal work of breathing, clear to auscultation bilaterally with no crackles, wheezes, or rhonchi Neuro: Grossly normal, moves all extremities Psych: Normal affect and thought content      Troy [name] M. Jimmey Ralph, MD 05/18/2023 9:27 AM

## 2023-06-15 ENCOUNTER — Ambulatory Visit: Payer: Medicare PPO | Admitting: Family Medicine

## 2023-06-22 ENCOUNTER — Encounter: Payer: Self-pay | Admitting: Family Medicine

## 2023-06-22 ENCOUNTER — Ambulatory Visit: Payer: Medicare PPO | Admitting: Family Medicine

## 2023-06-22 VITALS — BP 138/79 | HR 65 | Temp 98.0°F | Ht 70.0 in | Wt 189.9 lb

## 2023-06-22 DIAGNOSIS — I10 Essential (primary) hypertension: Secondary | ICD-10-CM

## 2023-06-22 DIAGNOSIS — E785 Hyperlipidemia, unspecified: Secondary | ICD-10-CM | POA: Diagnosis not present

## 2023-06-22 DIAGNOSIS — Z8669 Personal history of other diseases of the nervous system and sense organs: Secondary | ICD-10-CM | POA: Diagnosis not present

## 2023-06-22 NOTE — Assessment & Plan Note (Signed)
Doing well on Lipitor 10 mg every other day.  Check lipids next blood draw.

## 2023-06-22 NOTE — Patient Instructions (Signed)
It was very nice to see you today!  Your ears are clear today.  Please continue to use Debrox and hydrogen peroxide as needed to prevent buildup.  We can irrigate again in few months if needed.  Return in about 6 months (around 12/23/2023) for Follow Up.   Take care, Dr Jimmey Ralph  PLEASE NOTE:  If you had any lab tests, please let us know if you have not heard back within a few days. You may see your results on mychart before we have a chance to review them but we will give you a call once they are reviewed by [place].   If we ordered any referrals today, please let us know if you have not heard from their office within the next week.   If you had any urgent prescriptions sent in today, please check with the pharmacy within an hour of our visit to make sure the prescription was transmitted appropriately.   Please try these tips to maintain a healthy lifestyle:  Eat at least 3 REAL meals and 1-2 snacks per day.  Aim for no more than 5 hours between eating.  If you eat breakfast, please do so within one hour of getting up.   Each meal should contain half fruits/vegetables, one quarter protein, and one quarter carbs (no bigger than a computer mouse)  Cut down on sweet beverages. This includes juice, soda, and sweet tea.   Drink at least 1 glass of water with each meal and aim for at least 8 glasses per day  Exercise at least 150 minutes every week.

## 2023-06-22 NOTE — Progress Notes (Signed)
   Troy [name] is a 72 y.o. male who presents today for an office visit.  Assessment/Plan:  New/Acute Problems: History of cerumen impaction Clear EACs today.  He can continue using Debrox or hydrogen peroxide.  He can come back in 3 to 6 months for recheck if needed.  Chronic Problems Addressed Today: Essential hypertension Blood pressure at goal on amlodipine 5 mg daily.  Home readings are at goal as well.  Dyslipidemia Doing well on Lipitor 10 mg every other day.  Check lipids next blood draw.     Subjective:  HPI:  Patient here for follow up.  He was here about a month ago.  At that time had cerumen impaction that was successfully irrigated.  He would like to have this rechecked today.  Doing well.  No acute changes.       Objective:  Physical Exam: BP 138/79   Pulse 65   Temp 98 F (36.7 C) (Temporal)   Ht 5\' 10"  (1.778 m)   Wt 189 lb 14.4 oz (86.1 kg)   SpO2 97%   BMI 27.25 kg/m   Gen: No acute distress, resting comfortably HEENT: TMs with very small amount of cerumen bilaterally.  No impaction. CV: Regular rate and rhythm with no murmurs appreciated Pulm: Normal work of breathing, clear to auscultation bilaterally with no crackles, wheezes, or rhonchi Neuro: Grossly normal, moves all extremities Psych: Normal affect and thought content      Troy [name] M. Jimmey Ralph, MD 06/22/2023 8:03 AM

## 2023-06-22 NOTE — Assessment & Plan Note (Signed)
Blood pressure at goal on amlodipine 5 mg daily.  Home readings are at goal as well.

## 2023-07-14 ENCOUNTER — Encounter: Payer: Self-pay | Admitting: Family Medicine

## 2023-07-14 NOTE — Telephone Encounter (Signed)
Okay for referral to Audiology?

## 2023-07-14 NOTE — Telephone Encounter (Signed)
Ok with referral.  Katina Degree. Jimmey Ralph, MD 07/14/2023 1:38 PM

## 2023-07-15 ENCOUNTER — Other Ambulatory Visit: Payer: Self-pay

## 2023-07-15 DIAGNOSIS — H9190 Unspecified hearing loss, unspecified ear: Secondary | ICD-10-CM

## 2023-08-15 ENCOUNTER — Ambulatory Visit: Payer: Medicare PPO | Attending: Family Medicine | Admitting: Audiologist

## 2023-08-15 DIAGNOSIS — H903 Sensorineural hearing loss, bilateral: Secondary | ICD-10-CM | POA: Insufficient documentation

## 2023-08-15 NOTE — Procedures (Signed)
Outpatient Audiology and [facility] [address] [place], [place]  [address] [phone]  AUDIOLOGICAL  EVALUATION  NAME: Troy [name]     DOB:   09-14-1951      MRN: [id number]                                                                                     DATE: 08/15/2023     REFERENT: Ardith Dark, MD STATUS: Outpatient DIAGNOSIS: Sensorineural Hearing Loss   History: [name] was seen for an audiological evaluation due to difficulty hearing. He needs to be facing people to hear and he realizes that he is struggling. Especially in places like a restaurant where there is background noise.  [name] used to get a hearing test with Dr. Allene Pyo office every year when he would get his ears cleaned.  He was told at that time he had some hearing loss but it was not at a level that would require the use of hearing aids.  He denies any pain pressure or tinnitus in either ear.  He said his only health concern is high blood pressure.  He denies any history of hazardous noise exposure.   Evaluation:  Otoscopy showed a clear view of the tympanic membranes, bilaterally Tympanometry results were consistent with normal middle ear function bilaterally Audiometric testing was completed using Conventional Audiometry techniques with insert earphones and TDH headphones. Test results are consistent with normal hearing through 3 kHz, sloping after 4K hertz to a severe high-frequency hearing loss at 6 and 8 kHz bilaterally. Speech Recognition Thresholds were obtained at 15 dB HL in the right ear and at 10 dB HL in the left ear. Word Recognition Testing was completed at 50 dB HL and [name] scored 100% in the right ear and 96% in the left ear.  Results:  The test results were reviewed with [name].  He has excellent ability to understand speech at a conversational level.  He has a hearing loss but it is only at the pitch is above 4 kHz.  Hearing aids do not amplify pitch is that high.  He is  not a hearing aid candidate at this time.  Recommend he continue to monitor his hearing every other year. [name] was given a copy of his hearing test  Recommendations: 1.   Recommend hearing tests every other year to monitor progression of age-related hearing loss.  18 minutes spent testing and counseling on results.   If you have any questions please feel free to contact me at [phone].  Ammie Ferrier  Audiologist, Au.D., CCC-A 08/15/2023  10:54 AM  Cc: Ardith Dark, MD

## 2023-10-30 ENCOUNTER — Encounter: Payer: Self-pay | Admitting: Family Medicine

## 2023-10-31 ENCOUNTER — Other Ambulatory Visit: Payer: Self-pay | Admitting: *Deleted

## 2023-10-31 DIAGNOSIS — Z7409 Other reduced mobility: Secondary | ICD-10-CM

## 2023-10-31 NOTE — Telephone Encounter (Signed)
Please advise 

## 2023-10-31 NOTE — Telephone Encounter (Signed)
Ok to place referral though insurance may request that we see him for an office visit first.  Katina Degree. Jimmey Ralph, MD 10/31/2023 12:51 PM

## 2023-11-21 ENCOUNTER — Ambulatory Visit: Payer: Medicare PPO | Admitting: Physical Therapy

## 2023-11-21 DIAGNOSIS — M25672 Stiffness of left ankle, not elsewhere classified: Secondary | ICD-10-CM | POA: Diagnosis not present

## 2023-11-21 DIAGNOSIS — M25521 Pain in right elbow: Secondary | ICD-10-CM

## 2023-11-21 DIAGNOSIS — M25561 Pain in right knee: Secondary | ICD-10-CM

## 2023-11-21 DIAGNOSIS — M25652 Stiffness of left hip, not elsewhere classified: Secondary | ICD-10-CM

## 2023-11-21 DIAGNOSIS — M25671 Stiffness of right ankle, not elsewhere classified: Secondary | ICD-10-CM | POA: Diagnosis not present

## 2023-11-21 NOTE — Therapy (Unsigned)
OUTPATIENT PHYSICAL THERAPY LOWER EXTREMITY EVALUATION   Patient Name: Troy [name] MRN: [id number] DOB:04/19/1951, 72 y.o., male Today's Date: 11/21/2023  END OF SESSION:   Past Medical History:  Diagnosis Date   BPH (benign prostatic hyperplasia)    Past Surgical History:  Procedure Laterality Date   COLONOSCOPY  11/24/2005   STARK-TICS ONLY   EYE SURGERY Bilateral 11/23/2003   lasik   JOINT REPLACEMENT Left 11/22/2005   Lt THR - Dr Sherlean Foot   POLYPECTOMY  2017   TA x 2   Patient Active Problem List   Diagnosis Date Noted   Essential hypertension 03/24/2023   Elevated blood pressure reading 11/24/2022   Degenerative disc disease, cervical 05/15/2020   Benign prostatic hyperplasia 05/22/2019   Erectile dysfunction 05/22/2019   Gout 01/08/2019   Degenerative disc disease, lumbar 12/11/2018   Nonallopathic lesion of sacral region 12/11/2018   Nonallopathic lesion of lumbar region 12/11/2018   Nonallopathic lesion of thoracic region 12/11/2018   Dyslipidemia 08/20/2014   Osteoarthritis 07/15/2009       PCP: ***  REFERRING PROVIDER: ***  REFERRING DIAG: ***  THERAPY DIAG:  No diagnosis found.  Rationale for Evaluation and Treatment: Rehabilitation  ONSET DATE: ***    SUBJECTIVE:   SUBJECTIVE STATEMENT: Plays golf,  R elbow, medial elbow, for about 2 months.  no wrist pain , hurts during golf with impact, hurts with lifting weights and bicep curls.  Bil ankles.  L hip: does not hurt, but but feels very stiff .  R knee: painful in the past, stays swollen, just the right  Supine: position:  L hip: flexion and ER very stiff  R hip: flexion mild/mod limitation.  R knee: mild fluid around kneecap Bil hallux ridis: no movement in both ; ankles mild stiff for inv/ev.     PERTINENT HISTORY: OA,   PAIN:  Are you having pain? Yes: NPRS scale: ***/10 Pain location: R medial elbow  Pain description: *** Aggravating factors: *** Relieving factors:  ***  Are you having pain? Yes: NPRS scale: ***/10 Pain location: Ankles feel stiff  Pain description: no pain, just stiff  Aggravating factors: *** Relieving factors: ***  Are you having pain? Yes: NPRS scale: ***/10 Pain location: L hip Pain description: *** Aggravating factors: *** Relieving factors: ***  PRECAUTIONS: {Therapy precautions:24002}  WEIGHT BEARING RESTRICTIONS: No  FALLS:  Has patient fallen in last 6 months? {fallsyesno:27318}   PLOF: Independent  PATIENT GOALS:  Decreased pain   NEXT MD VISIT:   OBJECTIVE:   DIAGNOSTIC FINDINGS:   PATIENT SURVEYS:    COGNITION: Overall cognitive status: Within functional limits for tasks assessed     SENSATION: WFL  EDEMA:    POSTURE:    No Significant postural limitations  PALPATION:   LOWER EXTREMITY ROM:  Active /Passive ROM Left eval Right eval  Hip flexion    Hip extension    Hip abduction    Hip adduction    Hip internal rotation    Hip external rotation    Knee flexion    Knee extension    Ankle dorsiflexion    Ankle plantarflexion    Ankle inversion    Ankle eversion     (Blank rows = not tested)  LOWER EXTREMITY MMT:  MMT Left eval Right  eval  Hip flexion    Hip extension    Hip abduction    Hip adduction    Hip internal rotation    Hip external rotation  Knee flexion    Knee extension    Ankle dorsiflexion    Ankle plantarflexion    Ankle inversion    Ankle eversion     (Blank rows = not tested)  LOWER EXTREMITY SPECIAL TESTS:  {LEspecialtests:26242}  FUNCTIONAL TESTS:  {Functional tests:24029}  GAIT: Distance walked: *** Assistive device utilized: {Assistive devices:23999} Level of assistance: {Levels of assistance:24026} Comments: ***   TODAY'S TREATMENT:                                                                                                                              DATE:     PATIENT EDUCATION:  Education details: PT POC, Exam  findings, HEP Person educated: Patient Education method: Explanation, Demonstration, Tactile cues, Verbal cues, and Handouts Education comprehension: verbalized understanding, returned demonstration, verbal cues required, tactile cues required, and needs further education   HOME EXERCISE PROGRAM: ***  ASSESSMENT:  CLINICAL IMPRESSION: Patient presents with primary complaint of  ***   Pt with decreased ability for full functional activities. Pt will  benefit from skilled PT to improve deficits and pain and to return to PLOF.   OBJECTIVE IMPAIRMENTS: {opptimpairments:25111}.   ACTIVITY LIMITATIONS: {activitylimitations:27494}  PARTICIPATION LIMITATIONS: {participationrestrictions:25113}  PERSONAL FACTORS: {Personal factors:25162} are also affecting patient's functional outcome.   REHAB POTENTIAL: {rehabpotential:25112}  CLINICAL DECISION MAKING: {clinical decision making:25114}  EVALUATION COMPLEXITY: {Evaluation complexity:25115}   GOALS: Goals reviewed with patient? Yes  SHORT TERM GOALS: Target date: ***  ***  Goal status: INITIAL  2.  ***  Goal status: INITIAL  3.  ***  Goal status: INITIAL    LONG TERM GOALS: Target date: ***  ***  Goal status: INITIAL  2.  ***  Goal status: INITIAL  3.  ***  Goal status: INITIAL  4.  *** Baseline:  Goal status: INITIAL  5.  ***  Goal status: INITIAL     PLAN:  PT FREQUENCY: {rehab frequency:25116}  PT DURATION: {rehab duration:25117}  PLANNED INTERVENTIONS: Therapeutic exercises, Therapeutic activity, Neuromuscular re-education, Patient/Family education, Self Care, Joint mobilization, Joint manipulation, Stair training, Orthotic/Fit training, DME instructions, Aquatic Therapy, Dry Needling, Electrical stimulation, Cryotherapy, Moist heat, Taping, Ultrasound, Ionotophoresis 4mg /ml Dexamethasone, Manual therapy,  Vasopneumatic device, Traction, Spinal manipulation, Spinal mobilization,Balance  training, Gait training,   PLAN FOR NEXT SESSION:    Sedalia Muta, PT 05/03/2023, 9:15 AM

## 2023-11-24 ENCOUNTER — Encounter: Payer: Self-pay | Admitting: Physical Therapy

## 2023-11-28 ENCOUNTER — Ambulatory Visit: Payer: Medicare PPO | Admitting: Physical Therapy

## 2023-11-28 ENCOUNTER — Encounter: Payer: Self-pay | Admitting: Physical Therapy

## 2023-11-28 DIAGNOSIS — M25652 Stiffness of left hip, not elsewhere classified: Secondary | ICD-10-CM | POA: Diagnosis not present

## 2023-11-28 DIAGNOSIS — M25672 Stiffness of left ankle, not elsewhere classified: Secondary | ICD-10-CM

## 2023-11-28 DIAGNOSIS — M25561 Pain in right knee: Secondary | ICD-10-CM | POA: Diagnosis not present

## 2023-11-28 DIAGNOSIS — M25521 Pain in right elbow: Secondary | ICD-10-CM | POA: Diagnosis not present

## 2023-11-28 DIAGNOSIS — M25671 Stiffness of right ankle, not elsewhere classified: Secondary | ICD-10-CM

## 2023-11-28 NOTE — Therapy (Signed)
 OUTPATIENT PHYSICAL THERAPY LOWER EXTREMITY TREATMENT   Patient Name: Troy [name] MRN: [id number] DOB:09-27-1951, 73 y.o., male Today's Date: 11/27/2022  END OF SESSION:  PT End of Session - 11/28/23 1108     Visit Number 2    Number of Visits 16    Date for PT Re-Evaluation 01/16/24    Authorization Type [employer]    PT Start Time 1107    PT Stop Time 1148    PT Time Calculation (min) 41 min    Activity Tolerance Patient tolerated treatment well    Behavior During Therapy [facility] for tasks assessed/performed              Past Medical History:  Diagnosis Date   BPH (benign prostatic hyperplasia)    Past Surgical History:  Procedure Laterality Date   COLONOSCOPY  11/24/2005   STARK-TICS ONLY   EYE SURGERY Bilateral 11/23/2003   lasik   JOINT REPLACEMENT Left 11/22/2005   Lt THR - Dr Rubie   POLYPECTOMY  2017   TA x 2   Patient Active Problem List   Diagnosis Date Noted   Essential hypertension 03/24/2023   Elevated blood pressure reading 11/24/2022   Degenerative disc disease, cervical 05/15/2020   Benign prostatic hyperplasia 05/22/2019   Erectile dysfunction 05/22/2019   Gout 01/08/2019   Degenerative disc disease, lumbar 12/11/2018   Nonallopathic lesion of sacral region 12/11/2018   Nonallopathic lesion of lumbar region 12/11/2018   Nonallopathic lesion of thoracic region 12/11/2018   Dyslipidemia 08/20/2014   Osteoarthritis 07/15/2009       PCP: Worth Kitty  REFERRING PROVIDER: Worth Kitty  REFERRING DIAG: poor mobility  THERAPY DIAG:  Pain in right elbow  Stiffness of left ankle, not elsewhere classified  Stiffness of right ankle, not elsewhere classified  Acute pain of right knee  Stiffness of hip joint, left  Rationale for Evaluation and Treatment: Rehabilitation  ONSET DATE:    SUBJECTIVE:   SUBJECTIVE STATEMENT: Pt states slightly less elbow pain   Eval: Pt states several areas of concern. Most pain is in R elbow, medial  elbow, pain present for about 2 months.  No wrist pain , states elbow hurts during golf with impact, hurts with lifting weights and bicep curls.  Reports much stiffness in Bil ankles, no pain .  L hip: does not hurt, but but feels very stiff .  R knee: painful in the past, stays swollen, just the right , no pain on L.     PERTINENT HISTORY: OA,   PAIN:  Are you having pain? Yes: NPRS scale: 6/10 Pain location: R medial elbow  Pain description: sore, weak Aggravating factors: golf, weight lifting  Relieving factors: rest  Are you having pain? Yes: NPRS scale: 0/10 Pain location: Ankles feel stiff  Pain description: no pain, just stiff  Aggravating factors:  Relieving factors:   Are you having pain? Yes: NPRS scale: 0-3/10 Pain location: L hip Pain description: stiff Aggravating factors: sitting, increased activity  Relieving factors: none stated   PRECAUTIONS: None  WEIGHT BEARING RESTRICTIONS: No  FALLS:  Has patient fallen in last 6 months? No   PLOF: Independent  PATIENT GOALS:  Decreased pain   NEXT MD VISIT:   OBJECTIVE:   DIAGNOSTIC FINDINGS:   PATIENT SURVEYS:    COGNITION: Overall cognitive status: Within functional limits for tasks assessed     SENSATION: WFL  EDEMA: mild around R knee cap    POSTURE:    No Significant postural limitations  PALPATION: Hypomobile L hip R knee: mild fluid around kneecap Hypomobile bil ankles and bil great toe  Elbow: painful ulnar head, mild pain into pronator   LOWER EXTREMITY ROM:   L hip: flexion and ER significant stiffness    R hip: flexion mild/mod limitation.  Bil hallux rigidus: ; ankles mild/mod limitation for inv/ev.  Elbow: WFL   LOWER EXTREMITY MMT:  MMT Left eval Right  eval  Hip flexion 4 4  Hip extension    Hip abduction 4 4  Hip adduction    Hip internal rotation    Hip external rotation    Knee flexion 5 5  Knee extension 5 5  Ankle dorsiflexion 4 4  Ankle plantarflexion              (Blank rows = not tested)  LOWER EXTREMITY SPECIAL TESTS:  Elbow : Pain with resisted pronation   GAIT: trunk fwd flexed posture     TODAY'S TREATMENT:                                                                                                                              DATE:  11/28/2023  Therapeutic Exercise: Aerobic: Supine: SKTC with towel roll 30 sec  3 bil; Hip ER fallouts x 15;  Quadruped: Cat/cow with education for HEP, x 15;  Seated: Seated hip flexion/hip hinge 2 x 10,  seated pelvic tilts x 15;  Standing: Stretches:   supine: elbow extension stretch, with wrist extension 2 x 10, arm on towel roll;   Neuromuscular Re-education: Manual Therapy: Elbow ROM for flex, ext, and pron/sup. DTM to medial elbow musculature. Elbow mobs for extension.  Therapeutic Activity: Self Care:    PATIENT EDUCATION:  Education details: PT POC, Exam findings, HEP Person educated: Patient Education method: Explanation, Demonstration, Tactile cues, Verbal cues, and Handouts Education comprehension: verbalized understanding, returned demonstration, verbal cues required, tactile cues required, and needs further education   HOME EXERCISE PROGRAM: Wrist/elbow extension stretch 15 sec x 4,  2-3 x/day   ASSESSMENT:  CLINICAL IMPRESSION: Focus for hip mobility and elbow mobilty today. Pt educated on initial HEP for both. He has significant stiffness in L hip for all motions. R elbow sore with motion from full extension starting into flexion, most pain at ulnar head, he has tightness into forearm musculature but minimal pain there. Plan to progress elbow and hip mobility as tolerated and start ankle mobility as able.   Eval: Patient presents with primary complaint of  stiffness in bil ankles as well as L hip. He has significant hypomobility in L hip, lumbar spine,  as well as ankles and bil great toes. He will benefit from improving ROM for LE s. He also has painful R elbow,  medially. He has decreased ability for full functional activities, lifting, carrying, gripping and exercise, due to pain . Pt will  benefit from skilled PT to improve deficits and pain and to return to PLOF.  OBJECTIVE IMPAIRMENTS: Abnormal gait, decreased activity tolerance, decreased mobility, decreased ROM, decreased strength, hypomobility, increased muscle spasms, impaired UE functional use, and pain.   ACTIVITY LIMITATIONS: carrying, lifting, bending, sitting, standing, squatting, stairs, and locomotion level  PARTICIPATION LIMITATIONS: meal prep, laundry, driving, shopping, community activity, and yard work  PERSONAL FACTORS: Past/current experiences and Time since onset of injury/illness/exacerbation are also affecting patient's functional outcome.   REHAB POTENTIAL: Good  CLINICAL DECISION MAKING: Stable/uncomplicated  EVALUATION COMPLEXITY: Low   GOALS: Goals reviewed with patient? Yes  SHORT TERM GOALS: Target date: 12/05/23  Pt to be independent with initial HEP  Goal status: INITIAL  2. Pt to report decreased pain in R elbow to 0-4/10 with activity      LONG TERM GOALS: Target date: 01/16/24  Pt to be independent with final HEP  Goal status: INITIAL  2.  Pt to demo improved ankle and hip ROM to be at max ability to improve ability for bending, lifting, and IADLs.   Goal status: INITIAL  3.  Pt to report decreased pain in R elbow to 0-2/10 with ADLs, IADLs, and golf .   Goal status: INITIAL  4.  Pt to report improvement in overall symptoms by at least 50 %.   Goal status: INITIAL    PLAN:  PT FREQUENCY: 1-2x/week  PT DURATION: 8 weeks  PLANNED INTERVENTIONS: Therapeutic exercises, Therapeutic activity, Neuromuscular re-education, Patient/Family education, Self Care, Joint mobilization, Joint manipulation, Stair training, Orthotic/Fit training, DME instructions, Aquatic Therapy, Dry Needling, Electrical stimulation, Cryotherapy, Moist heat, Taping,  Ultrasound, Ionotophoresis 4mg /ml Dexamethasone, Manual therapy,  Vasopneumatic device, Traction, Spinal manipulation, Spinal mobilization,Balance training, Gait training,   PLAN FOR NEXT SESSION: ankle, foot stiffness,  L hip and back stiffness, R elbow pain.    Tinnie Don, PT, DPT 11:49 AM  11/28/23

## 2023-11-30 ENCOUNTER — Encounter: Payer: Self-pay | Admitting: Family Medicine

## 2023-11-30 ENCOUNTER — Ambulatory Visit (INDEPENDENT_AMBULATORY_CARE_PROVIDER_SITE_OTHER): Payer: Medicare PPO | Admitting: Family Medicine

## 2023-11-30 VITALS — BP 149/79 | HR 68 | Temp 98.1°F | Ht 70.0 in | Wt 193.0 lb

## 2023-11-30 DIAGNOSIS — Z131 Encounter for screening for diabetes mellitus: Secondary | ICD-10-CM | POA: Diagnosis not present

## 2023-11-30 DIAGNOSIS — N529 Male erectile dysfunction, unspecified: Secondary | ICD-10-CM

## 2023-11-30 DIAGNOSIS — I10 Essential (primary) hypertension: Secondary | ICD-10-CM | POA: Diagnosis not present

## 2023-11-30 DIAGNOSIS — N400 Enlarged prostate without lower urinary tract symptoms: Secondary | ICD-10-CM | POA: Diagnosis not present

## 2023-11-30 DIAGNOSIS — E785 Hyperlipidemia, unspecified: Secondary | ICD-10-CM

## 2023-11-30 DIAGNOSIS — Z0001 Encounter for general adult medical examination with abnormal findings: Secondary | ICD-10-CM

## 2023-11-30 LAB — COMPREHENSIVE METABOLIC PANEL
ALT: 29 U/L (ref 0–53)
AST: 24 U/L (ref 0–37)
Albumin: 4.7 g/dL (ref 3.5–5.2)
Alkaline Phosphatase: 67 U/L (ref 39–117)
BUN: 10 mg/dL (ref 6–23)
CO2: 26 meq/L (ref 19–32)
Calcium: 9.7 mg/dL (ref 8.4–10.5)
Chloride: 101 meq/L (ref 96–112)
Creatinine, Ser: 0.85 mg/dL (ref 0.40–1.50)
GFR: 86.55 mL/min (ref >60.00)
Glucose, Bld: 83 mg/dL (ref 70–99)
Potassium: 4.3 meq/L (ref 3.5–5.1)
Sodium: 135 meq/L (ref 135–145)
Total Bilirubin: 0.5 mg/dL (ref 0.2–1.2)
Total Protein: 7.2 g/dL (ref 6.0–8.3)

## 2023-11-30 LAB — CBC
HCT: 45 % (ref 39.0–52.0)
Hemoglobin: 15.4 g/dL (ref 13.0–17.0)
MCHC: 34.2 g/dL (ref 30.0–36.0)
MCV: 94 fL (ref 78.0–100.0)
Platelets: 252 10*3/uL (ref 150.0–400.0)
RBC: 4.79 Mil/uL (ref 4.22–5.81)
RDW: 13.2 % (ref 11.5–15.5)
WBC: 6.2 10*3/uL (ref 4.0–10.5)

## 2023-11-30 LAB — LIPID PANEL
Cholesterol: 143 mg/dL (ref 0–200)
HDL: 40.5 mg/dL (ref >39.00)
LDL Cholesterol: 76 mg/dL (ref 0–99)
NonHDL: 102.58
Total CHOL/HDL Ratio: 4
Triglycerides: 134 mg/dL (ref 0.0–149.0)
VLDL: 26.8 mg/dL (ref 0.0–40.0)

## 2023-11-30 LAB — TSH: TSH: 2.25 u[IU]/mL (ref 0.35–5.50)

## 2023-11-30 LAB — HEMOGLOBIN A1C: Hgb A1c MFr Bld: 5.6 % (ref 4.6–6.5)

## 2023-11-30 LAB — PSA: PSA: 1.93 ng/mL (ref 0.10–4.00)

## 2023-11-30 NOTE — Assessment & Plan Note (Addendum)
 Initially elevated to 168/90.  Improved to 149/79 on recheck.  He has done well with amlodipine  5 mg daily.  Home readings are typically at goal in the 130s to 140s over 70s.  Will continue amlodipine  5 mg daily.  His previous readings here were at goal as well.  He will continue monitor at home and let us  know if persistently elevated and we can increase amlodipine  if needed.

## 2023-11-30 NOTE — Assessment & Plan Note (Signed)
 Doing well up toward 2 mg every other day.  Check lipids today.  He is doing a great job with lifestyle modifications as well.

## 2023-11-30 NOTE — Assessment & Plan Note (Signed)
Check PSA.  Continue finasteride 5 mg daily.

## 2023-11-30 NOTE — Patient Instructions (Signed)
 It was very nice to see you today!  We will check blood work today.  Please keep an eye on your blood pressure.  Let us  know if persistently elevated.  Please keep working on diet and exercise.   Return in about 1 year (around 11/29/2024) for Annual Physical.   Take care, Dr Kennyth  PLEASE NOTE:  If you had any lab tests, please let us  know if you have not heard back within a few days. You may see your results on mychart before we have a chance to review them but we will give you a call once they are reviewed by us .   If we ordered any referrals today, please let us  know if you have not heard from their office within the next week.   If you had any urgent prescriptions sent in today, please check with the pharmacy within an hour of our visit to make sure the prescription was transmitted appropriately.   Please try these tips to maintain a healthy lifestyle:  Eat at least 3 REAL meals and 1-2 snacks per day.  Aim for no more than 5 hours between eating.  If you eat breakfast, please do so within one hour of getting up.   Each meal should contain half fruits/vegetables, one quarter protein, and one quarter carbs (no bigger than a computer mouse)  Cut down on sweet beverages. This includes juice, soda, and sweet tea.   Drink at least 1 glass of water with each meal and aim for at least 8 glasses per day  Exercise at least 150 minutes every week.    Preventive Care 36 Years and Older, Male Preventive care refers to lifestyle choices and visits with your health care provider that can promote health and wellness. Preventive care visits are also called wellness exams. What can I expect for my preventive care visit? Counseling During your preventive care visit, your health care provider may ask about your: Medical history, including: Past medical problems. Family medical history. History of falls. Current health, including: Emotional well-being. Home life and relationship  well-being. Sexual activity. Memory and ability to understand (cognition). Lifestyle, including: Alcohol, nicotine or tobacco, and drug use. Access to firearms. Diet, exercise, and sleep habits. Work and work astronomer. Sunscreen use. Safety issues such as seatbelt and bike helmet use. Physical exam Your health care provider will check your: Height and weight. These may be used to calculate your BMI (body mass index). BMI is a measurement that tells if you are at a healthy weight. Waist circumference. This measures the distance around your waistline. This measurement also tells if you are at a healthy weight and may help predict your risk of certain diseases, such as type 2 diabetes and high blood pressure. Heart rate and blood pressure. Body temperature. Skin for abnormal spots. What immunizations do I need?  Vaccines are usually given at various ages, according to a schedule. Your health care provider will recommend vaccines for you based on your age, medical history, and lifestyle or other factors, such as travel or where you work. What tests do I need? Screening Your health care provider may recommend screening tests for certain conditions. This may include: Lipid and cholesterol levels. Diabetes screening. This is done by checking your blood sugar (glucose) after you have not eaten for a while (fasting). Hepatitis C test. Hepatitis B test. HIV (human immunodeficiency virus) test. STI (sexually transmitted infection) testing, if you are at risk. Lung cancer screening. Colorectal cancer screening. Prostate cancer screening. Abdominal  aortic aneurysm (AAA) screening. You may need this if you are a current or former smoker. Talk with your health care provider about your test results, treatment options, and if necessary, the need for more tests. Follow these instructions at home: Eating and drinking  Eat a diet that includes fresh fruits and vegetables, whole grains, lean  protein, and low-fat dairy products. Limit your intake of foods with high amounts of sugar, saturated fats, and salt. Take vitamin and mineral supplements as recommended by your health care provider. Do not drink alcohol if your health care provider tells you not to drink. If you drink alcohol: Limit how much you have to 0-2 drinks a day. Know how much alcohol is in your drink. In the [place] one drink equals one 12 oz bottle of beer (355 mL), one 5 oz glass of wine (148 mL), or one 1 oz glass of hard liquor (44 mL). Lifestyle Brush your teeth every morning and night with fluoride toothpaste. Floss one time each day. Exercise for at least 30 minutes 5 or more days each week. Do not use any products that contain nicotine or tobacco. These products include cigarettes, chewing tobacco, and vaping devices, such as e-cigarettes. If you need help quitting, ask your health care provider. Do not use drugs. If you are sexually active, practice safe sex. Use a condom or other form of protection to prevent STIs. Take aspirin only as told by your health care provider. Make sure that you understand how much to take and what form to take. Work with your health care provider to find out whether it is safe and beneficial for you to take aspirin daily. Ask your health care provider if you need to take a cholesterol-lowering medicine (statin). Find healthy ways to manage stress, such as: Meditation, yoga, or listening to music. Journaling. Talking to a trusted person. Spending time with friends and family. Safety Always wear your seat belt while driving or riding in a vehicle. Do not drive: If you have been drinking alcohol. Do not ride with someone who has been drinking. When you are tired or distracted. While texting. If you have been using any mind-altering substances or drugs. Wear a helmet and other protective equipment during sports activities. If you have firearms in your house, make sure you follow  all gun safety procedures. Minimize exposure to UV radiation to reduce your risk of skin cancer. What's next? Visit your health care provider once a year for an annual wellness visit. Ask your health care provider how often you should have your eyes and teeth checked. Stay up to date on all vaccines. This information is not intended to replace advice given to you by your health care provider. Make sure you discuss any questions you have with your health care provider. Document Revised: 05/06/2021 Document Reviewed: 05/06/2021 Elsevier Patient Education  2024 [employer].

## 2023-11-30 NOTE — Assessment & Plan Note (Signed)
Stable on sildenafil as needed.  Does not need refill today. 

## 2023-11-30 NOTE — Progress Notes (Signed)
 Chief Complaint:  Troy [name] is a 73 y.o. male who presents today for his annual comprehensive physical exam.    Assessment/Plan:  Chronic Problems Addressed Today: Essential hypertension Initially elevated to 168/90.  Improved to 149/79 on recheck.  He has done well with amlodipine  5 mg daily.  Home readings are typically at goal in the 130s to 140s over 70s.  Will continue amlodipine  5 mg daily.  His previous readings here were at goal as well.  He will continue monitor at home and let us  know if persistently elevated and we can increase amlodipine  if needed.   Dyslipidemia Doing well up toward 2 mg every other day.  Check lipids today.  He is doing a great job with lifestyle modifications as well.  Benign prostatic hyperplasia Check PSA.  Continue finasteride  5 mg daily.  Erectile dysfunction Stable on sildenafil  as needed.  Does not need refill today.  Preventative Healthcare: Check labs. UpToDate on vaccines. Due for colonoscopy 2029.  Patient Counseling(The following topics were reviewed and/or handout was given):  -Nutrition: Stressed importance of moderation in sodium/caffeine intake, saturated fat and cholesterol, caloric balance, sufficient intake of fresh fruits, vegetables, and fiber.  -Stressed the importance of regular exercise.   -Substance Abuse: Discussed cessation/primary prevention of tobacco, alcohol, or other drug use; driving or other dangerous activities under the influence; availability of treatment for abuse.   -Injury prevention: Discussed safety belts, safety helmets, smoke detector, smoking near bedding or upholstery.   -Sexuality: Discussed sexually transmitted diseases, partner selection, use of condoms, avoidance of unintended pregnancy and contraceptive alternatives.   -Dental health: Discussed importance of regular tooth brushing, flossing, and dental visits.  -Health maintenance and immunizations reviewed. Please refer to Health maintenance  section.  Return to care in 1 year for next preventative visit.     Subjective:  HPI:  He has no acute complaints today. See Assessment / plan for status ofchronic conditions.   Lifestyle Diet: Balanced. Plenty of fruits and vegetables.  Exercise: [employer] 2-3 times per week. Cardiology and weight training.      11/30/2023    9:37 AM  Depression screen PHQ 2/9  Decreased Interest 0  Down, Depressed, Hopeless 0  PHQ - 2 Score 0    Health Maintenance Due  Topic Date Due   Medicare Annual Wellness (AWV)  12/22/2023     ROS: Per HPI, otherwise a complete review of systems was negative.   PMH:  The following were reviewed and entered/updated in epic: Past Medical History:  Diagnosis Date   BPH (benign prostatic hyperplasia)    Patient Active Problem List   Diagnosis Date Noted   Essential hypertension 03/24/2023   Degenerative disc disease, cervical 05/15/2020   Benign prostatic hyperplasia 05/22/2019   Erectile dysfunction 05/22/2019   Gout 01/08/2019   Degenerative disc disease, lumbar 12/11/2018   Nonallopathic lesion of sacral region 12/11/2018   Nonallopathic lesion of lumbar region 12/11/2018   Nonallopathic lesion of thoracic region 12/11/2018   Dyslipidemia 08/20/2014   Osteoarthritis 07/15/2009   Past Surgical History:  Procedure Laterality Date   COLONOSCOPY  11/24/2005   STARK-TICS ONLY   EYE SURGERY Bilateral 11/23/2003   lasik   JOINT REPLACEMENT Left 11/22/2005   Lt THR - Dr Rubie   POLYPECTOMY  2017   TA x 2    Family History  Problem Relation Age of Onset   Heart disease Father    Cancer Brother        Lung Cancer  Colon cancer Neg Hx    Colon polyps Neg Hx    Esophageal cancer Neg Hx    Rectal cancer Neg Hx    Stomach cancer Neg Hx    Prostate cancer Neg Hx     Medications- reviewed and updated Current Outpatient Medications  Medication Sig Dispense Refill   amLODipine  (NORVASC ) 5 MG tablet Take 1 tablet (5 mg total) by mouth  daily. 90 tablet 3   atorvastatin  (LIPITOR) 10 MG tablet Take 1 tablet (10 mg total) by mouth every other day. 45 tablet 3   finasteride  (PROSCAR ) 5 MG tablet Take 1 tablet (5 mg total) by mouth daily. 90 tablet 3   ibuprofen (ADVIL) 100 MG tablet Take by mouth.     Multiple Vitamin (MULTIVITAMIN) capsule Take by mouth.     sildenafil  (REVATIO ) 20 MG tablet Take 2 to 5 tablets Daily as needed 90 tablet 9   No current facility-administered medications for this visit.    Allergies-reviewed and updated No Known Allergies  Social History   Socioeconomic History   Marital status: Married    Spouse name: Not on file   Number of children: Not on file   Years of education: Not on file   Highest education level: Bachelor's degree (e.g., BA, AB, BS)  Occupational History   Not on file  Tobacco Use   Smoking status: Never   Smokeless tobacco: Never  Vaping Use   Vaping status: Never Used  Substance and Sexual Activity   Alcohol use: Yes    Comment: rarely   Drug use: Never   Sexual activity: Yes  Other Topics Concern   Not on file  Social History Narrative   Not on file   Social Drivers of Health   Financial Resource Strain: Low Risk  (12/17/2022)   Overall Financial Resource Strain (CARDIA)    Difficulty of Paying Living Expenses: Not hard at all  Food Insecurity: No Food Insecurity (03/03/2023)   Hunger Vital Sign    Worried About Running Out of Food in the Last Year: Never true    Ran Out of Food in the Last Year: Never true  Transportation Needs: Unmet Transportation Needs (03/03/2023)   PRAPARE - Transportation    Lack of Transportation (Medical): Yes    Lack of Transportation (Non-Medical): Yes  Physical Activity: Sufficiently Active (03/03/2023)   Exercise Vital Sign    Days of Exercise per Week: 6 days    Minutes of Exercise per Session: 60 min  Stress: No Stress Concern Present (03/03/2023)   [employer] of Occupational Health - Occupational Stress Questionnaire     Feeling of Stress : Not at all  Social Connections: Moderately Integrated (03/03/2023)   Social Connection and Isolation Panel [NHANES]    Frequency of Communication with Friends and Family: More than three times a week    Frequency of Social Gatherings with Friends and Family: Twice a week    Attends Religious Services: Never    Database Administrator or Organizations: Yes    Attends Engineer, Structural: 1 to 4 times per year    Marital Status: Married        Objective:  Physical Exam: BP (!) 149/79   Pulse 68   Temp 98.1 F (36.7 C) (Temporal)   Ht 5' 10 (1.778 m)   Wt 193 lb (87.5 kg)   SpO2 98%   BMI 27.69 kg/m   Body mass index is 27.69 kg/m. Wt Readings from Last 3 Encounters:  11/30/23 193 lb (87.5 kg)  06/22/23 189 lb 14.4 oz (86.1 kg)  05/18/23 190 lb 6.4 oz (86.4 kg)   Gen: NAD, resting comfortably HEENT: TMs normal bilaterally. OP clear. No thyromegaly noted.  CV: RRR with no murmurs appreciated Pulm: NWOB, CTAB with no crackles, wheezes, or rhonchi GI: Normal bowel sounds present. Soft, Nontender, Nondistended. MSK: no edema, cyanosis, or clubbing noted Skin: warm, dry Neuro: CN2-12 grossly intact. Strength 5/5 in upper and lower extremities. Reflexes symmetric and intact bilaterally.  Psych: Normal affect and thought content     Troy Aller M. Kennyth, MD 11/30/2023 10:11 AM

## 2023-12-02 NOTE — Progress Notes (Signed)
 Blood work is all at goal.  Do not need to make any changes to treatment plan.  He should keep up the great work and we can recheck in a year.

## 2023-12-05 ENCOUNTER — Ambulatory Visit: Payer: Medicare PPO | Admitting: Physical Therapy

## 2023-12-05 ENCOUNTER — Encounter: Payer: Self-pay | Admitting: Physical Therapy

## 2023-12-05 DIAGNOSIS — M25652 Stiffness of left hip, not elsewhere classified: Secondary | ICD-10-CM | POA: Diagnosis not present

## 2023-12-05 DIAGNOSIS — M25672 Stiffness of left ankle, not elsewhere classified: Secondary | ICD-10-CM | POA: Diagnosis not present

## 2023-12-05 DIAGNOSIS — M25561 Pain in right knee: Secondary | ICD-10-CM | POA: Diagnosis not present

## 2023-12-05 DIAGNOSIS — M25521 Pain in right elbow: Secondary | ICD-10-CM | POA: Diagnosis not present

## 2023-12-05 DIAGNOSIS — M25671 Stiffness of right ankle, not elsewhere classified: Secondary | ICD-10-CM

## 2023-12-05 NOTE — Therapy (Signed)
 OUTPATIENT PHYSICAL THERAPY LOWER EXTREMITY TREATMENT   Patient Name: HAVIER DEEB MRN: [id number] DOB:1951-03-04, 73 y.o., male Today's Date: 12/04/2022  END OF SESSION:  PT End of Session - 12/05/23 0852     Visit Number 3    Number of Visits 16    Date for PT Re-Evaluation 01/16/24    Authorization Type [employer]    PT Start Time 0848    PT Stop Time 0930    PT Time Calculation (min) 42 min    Activity Tolerance Patient tolerated treatment well    Behavior During Therapy [facility] for tasks assessed/performed              Past Medical History:  Diagnosis Date   BPH (benign prostatic hyperplasia)    Past Surgical History:  Procedure Laterality Date   COLONOSCOPY  11/24/2005   STARK-TICS ONLY   EYE SURGERY Bilateral 11/23/2003   lasik   JOINT REPLACEMENT Left 11/22/2005   Lt THR - Dr Rubie   POLYPECTOMY  2017   TA x 2   Patient Active Problem List   Diagnosis Date Noted   Essential hypertension 03/24/2023   Degenerative disc disease, cervical 05/15/2020   Benign prostatic hyperplasia 05/22/2019   Erectile dysfunction 05/22/2019   Gout 01/08/2019   Degenerative disc disease, lumbar 12/11/2018   Nonallopathic lesion of sacral region 12/11/2018   Nonallopathic lesion of lumbar region 12/11/2018   Nonallopathic lesion of thoracic region 12/11/2018   Dyslipidemia 08/20/2014   Osteoarthritis 07/15/2009     PCP: Worth Kitty  REFERRING PROVIDER: Worth Kitty  REFERRING DIAG: poor mobility  THERAPY DIAG:  Pain in right elbow  Stiffness of left ankle, not elsewhere classified  Stiffness of right ankle, not elsewhere classified  Acute pain of right knee  Stiffness of hip joint, left  Rationale for Evaluation and Treatment: Rehabilitation  ONSET DATE:    SUBJECTIVE:   SUBJECTIVE STATEMENT: Pt states less elbow pain, not aching at rest, but also has not been playing golf. Feels like L hip is loosening up some.   Eval: Pt states several areas of  concern. Most pain is in R elbow, medial elbow, pain present for about 2 months.  No wrist pain , states elbow hurts during golf with impact, hurts with lifting weights and bicep curls.  Reports much stiffness in Bil ankles, no pain .  L hip: does not hurt, but but feels very stiff .  R knee: painful in the past, stays swollen, just the right , no pain on L.     PERTINENT HISTORY: OA,   PAIN:  Are you having pain? Yes: NPRS scale: 6/10 Pain location: R medial elbow  Pain description: sore, weak Aggravating factors: golf, weight lifting  Relieving factors: rest  Are you having pain? Yes: NPRS scale: 0/10 Pain location: Ankles feel stiff  Pain description: no pain, just stiff  Aggravating factors:  Relieving factors:   Are you having pain? Yes: NPRS scale: 0-3/10 Pain location: L hip Pain description: stiff Aggravating factors: sitting, increased activity  Relieving factors: none stated   PRECAUTIONS: None  WEIGHT BEARING RESTRICTIONS: No  FALLS:  Has patient fallen in last 6 months? No   PLOF: Independent  PATIENT GOALS:  Decreased pain   NEXT MD VISIT:   OBJECTIVE:   DIAGNOSTIC FINDINGS:   PATIENT SURVEYS:    COGNITION: Overall cognitive status: Within functional limits for tasks assessed     SENSATION: WFL  EDEMA: mild around R knee cap  POSTURE:    No Significant postural limitations  PALPATION: Hypomobile L hip R knee: mild fluid around kneecap Hypomobile bil ankles and bil great toe  Elbow: painful ulnar head, mild pain into pronator   LOWER EXTREMITY ROM:   L hip: flexion and ER significant stiffness    R hip: flexion mild/mod limitation.  Bil hallux rigidus: ; ankles mild/mod limitation for inv/ev.  Elbow: WFL   LOWER EXTREMITY MMT:  MMT Left eval Right  eval  Hip flexion 4 4  Hip extension    Hip abduction 4 4  Hip adduction    Hip internal rotation    Hip external rotation    Knee flexion 5 5  Knee extension 5 5  Ankle  dorsiflexion 4 4  Ankle plantarflexion             (Blank rows = not tested)  LOWER EXTREMITY SPECIAL TESTS:  Elbow : Pain with resisted pronation   GAIT: trunk fwd flexed posture     TODAY'S TREATMENT:                                                                                                                              DATE:   12/05/2023 Therapeutic Exercise: Aerobic: Supine:  Quadruped:  Seated: Seated hip flexion/hip hinge 2 x 10,  seated pelvic tilts x 20;  Standing: standing lumbar extension 2 x 10 (education for HEP after sitting for a while). Fwd lumbar flexion 30 sec x 3;   elbow extension and elbow supination stretches 20 sec x 5 ea;   Elbow flex/bicep curls 10 lb x 10 hammer curl, x 10 regular ;  supination 5lb x 15 on R;  Stretches:   supine: elbow extension stretch, with wrist extension 2 x 10, arm on towel roll;   Neuromuscular Re-education: Manual Therapy: Elbow mobs for extension.  Therapeutic Activity: Self Care:   Previous: Therapeutic Exercise: Aerobic: Supine: SKTC with towel roll 30 sec  3 bil; Hip ER fallouts x 15;  Quadruped: Cat/cow with education for HEP, x 15;  Seated: Seated hip flexion/hip hinge 2 x 10,  seated pelvic tilts x 20;  Standing: standing lumbar extension 2 x 10 (education for HEP after sitting for a while). Fwd lumbar flexion 30 sec x 3;   elbow extension and elbow supination stretches 20 sec x 5 ea;   Elbow flex/bicep curls 10 lb x 10 hammer curl, x 10 regular ;  supination 5lb x 15 on R;  Stretches:   supine: elbow extension stretch, with wrist extension 2 x 10, arm on towel roll;   Neuromuscular Re-education: Manual Therapy: Elbow ROM for flex, ext, and pron/sup. DTM to medial elbow musculature. Elbow mobs for extension.  Therapeutic Activity: Self Care:   PATIENT EDUCATION:  Education details: PT POC, Exam findings, HEP Person educated: Patient Education method: Explanation, Demonstration, Tactile cues, Verbal cues, and  Handouts Education comprehension: verbalized understanding, returned demonstration, verbal cues required, tactile  cues required, and needs further education   HOME EXERCISE PROGRAM: Wrist/elbow extension stretch 15 sec x 4,  2-3 x/day   ASSESSMENT:  CLINICAL IMPRESSION: Pt with improving pain in elbow. He has pain reproduced today with end range flexion with overpressure. No pain with bicep curls or mid range motion today. Discussed trying to swing golf club lightly 20-40 swings before he plays golf again.  He continues to have significant stiffness in hips, continued education on this today. Discussed trying to incorporate the bike with his workouts, for continued mobility in hips.   Eval: Patient presents with primary complaint of  stiffness in bil ankles as well as L hip. He has significant hypomobility in L hip, lumbar spine,  as well as ankles and bil great toes. He will benefit from improving ROM for LE s. He also has painful R elbow, medially. He has decreased ability for full functional activities, lifting, carrying, gripping and exercise, due to pain . Pt will  benefit from skilled PT to improve deficits and pain and to return to PLOF.   OBJECTIVE IMPAIRMENTS: Abnormal gait, decreased activity tolerance, decreased mobility, decreased ROM, decreased strength, hypomobility, increased muscle spasms, impaired UE functional use, and pain.   ACTIVITY LIMITATIONS: carrying, lifting, bending, sitting, standing, squatting, stairs, and locomotion level  PARTICIPATION LIMITATIONS: meal prep, laundry, driving, shopping, community activity, and yard work  PERSONAL FACTORS: Past/current experiences and Time since onset of injury/illness/exacerbation are also affecting patient's functional outcome.   REHAB POTENTIAL: Good  CLINICAL DECISION MAKING: Stable/uncomplicated  EVALUATION COMPLEXITY: Low   GOALS: Goals reviewed with patient? Yes  SHORT TERM GOALS: Target date: 12/05/23  Pt to be  independent with initial HEP  Goal status: INITIAL  2. Pt to report decreased pain in R elbow to 0-4/10 with activity     LONG TERM GOALS: Target date: 01/16/24  Pt to be independent with final HEP  Goal status: INITIAL  2.  Pt to demo improved ankle and hip ROM to be at max ability to improve ability for bending, lifting, and IADLs.   Goal status: INITIAL  3.  Pt to report decreased pain in R elbow to 0-2/10 with ADLs, IADLs, and golf .   Goal status: INITIAL  4.  Pt to report improvement in overall symptoms by at least 50 %.   Goal status: INITIAL    PLAN:  PT FREQUENCY: 1-2x/week  PT DURATION: 8 weeks  PLANNED INTERVENTIONS: Therapeutic exercises, Therapeutic activity, Neuromuscular re-education, Patient/Family education, Self Care, Joint mobilization, Joint manipulation, Stair training, Orthotic/Fit training, DME instructions, Aquatic Therapy, Dry Needling, Electrical stimulation, Cryotherapy, Moist heat, Taping, Ultrasound, Ionotophoresis 4mg /ml Dexamethasone, Manual therapy,  Vasopneumatic device, Traction, Spinal manipulation, Spinal mobilization,Balance training, Gait training,   PLAN FOR NEXT SESSION: ankle, foot stiffness,  L hip and back stiffness, R elbow pain.    Tinnie Don, PT, DPT 5:04 PM  12/05/23

## 2023-12-19 ENCOUNTER — Encounter: Payer: Medicare PPO | Admitting: Physical Therapy

## 2023-12-21 ENCOUNTER — Ambulatory Visit: Payer: Medicare PPO | Admitting: Physical Therapy

## 2023-12-21 ENCOUNTER — Encounter: Payer: Self-pay | Admitting: Physical Therapy

## 2023-12-21 DIAGNOSIS — M25561 Pain in right knee: Secondary | ICD-10-CM | POA: Diagnosis not present

## 2023-12-21 DIAGNOSIS — M25672 Stiffness of left ankle, not elsewhere classified: Secondary | ICD-10-CM | POA: Diagnosis not present

## 2023-12-21 DIAGNOSIS — M25521 Pain in right elbow: Secondary | ICD-10-CM

## 2023-12-21 DIAGNOSIS — M25652 Stiffness of left hip, not elsewhere classified: Secondary | ICD-10-CM | POA: Diagnosis not present

## 2023-12-21 DIAGNOSIS — M25671 Stiffness of right ankle, not elsewhere classified: Secondary | ICD-10-CM

## 2023-12-21 NOTE — Therapy (Signed)
OUTPATIENT PHYSICAL THERAPY LOWER EXTREMITY TREATMENT   Patient Name: Troy [name] MRN: [id number] DOB:08-05-1951, 73 y.o., male Today's Date: 12/21/2023    END OF SESSION:  PT End of Session - 12/21/23 1350     Visit Number 4    Number of Visits 16    Date for PT Re-Evaluation 01/16/24    Authorization Type [employer]    PT Start Time 1352    PT Stop Time 1435    PT Time Calculation (min) 43 min    Activity Tolerance Patient tolerated treatment well    Behavior During Therapy WFL for tasks assessed/performed              Past Medical History:  Diagnosis Date   BPH (benign prostatic hyperplasia)    Past Surgical History:  Procedure Laterality Date   COLONOSCOPY  11/24/2005   STARK-TICS ONLY   EYE SURGERY Bilateral 11/23/2003   lasik   JOINT REPLACEMENT Left 11/22/2005   Lt THR - Dr Sherlean Foot   POLYPECTOMY  2017   TA x 2   Patient Active Problem List   Diagnosis Date Noted   Essential hypertension 03/24/2023   Degenerative disc disease, cervical 05/15/2020   Benign prostatic hyperplasia 05/22/2019   Erectile dysfunction 05/22/2019   Gout 01/08/2019   Degenerative disc disease, lumbar 12/11/2018   Nonallopathic lesion of sacral region 12/11/2018   Nonallopathic lesion of lumbar region 12/11/2018   Nonallopathic lesion of thoracic region 12/11/2018   Dyslipidemia 08/20/2014   Osteoarthritis 07/15/2009     PCP: Jacquiline Doe  REFERRING PROVIDER: Jacquiline Doe  REFERRING DIAG: poor mobility  THERAPY DIAG:  Pain in right elbow  Stiffness of left ankle, not elsewhere classified  Stiffness of right ankle, not elsewhere classified  Acute pain of right knee  Stiffness of hip joint, left  Rationale for Evaluation and Treatment: Rehabilitation  ONSET DATE:    SUBJECTIVE:   SUBJECTIVE STATEMENT: Pt states no elbow pain in last couple weeks. Has played golf without pain.   Eval: Pt states several areas of concern. Most pain is in R elbow, medial elbow,  pain present for about 2 months.  No wrist pain , states elbow hurts during golf with impact, hurts with lifting weights and bicep curls.  Reports much stiffness in Bil ankles, no pain .  L hip: does not hurt, but but feels very stiff .  R knee: painful in the past, stays swollen, just the right , no pain on L.     PERTINENT HISTORY: OA,   PAIN:  Are you having pain? Yes: NPRS scale: 0-2/10 Pain location: R medial elbow  Pain description: sore, weak Aggravating factors: golf, weight lifting  Relieving factors: rest  Are you having pain? Yes: NPRS scale: 0/10 Pain location: Ankles feel stiff  Pain description: no pain, just stiff  Aggravating factors:  Relieving factors:   Are you having pain? Yes: NPRS scale: 0-3/10 Pain location: L hip Pain description: stiff Aggravating factors: sitting, increased activity  Relieving factors: none stated   PRECAUTIONS: None  WEIGHT BEARING RESTRICTIONS: No  FALLS:  Has patient fallen in last 6 months? No   PLOF: Independent  PATIENT GOALS:  Decreased pain   NEXT MD VISIT:   OBJECTIVE:   DIAGNOSTIC FINDINGS:   PATIENT SURVEYS:    COGNITION: Overall cognitive status: Within functional limits for tasks assessed     SENSATION: WFL  EDEMA: mild around R knee cap    POSTURE:    No Significant postural  limitations  PALPATION: Hypomobile L hip R knee: mild fluid around kneecap Hypomobile bil ankles and bil great toe  Elbow: painful ulnar head, mild pain into pronator   LOWER EXTREMITY ROM:   L hip: flexion and ER significant stiffness    R hip: flexion mild/mod limitation.  Bil hallux rigidus: ; ankles mild/mod limitation for inv/ev.  Elbow: WFL   LOWER EXTREMITY MMT:  MMT Left eval Right  eval  Hip flexion 4 4  Hip extension    Hip abduction 4 4  Hip adduction    Hip internal rotation    Hip external rotation    Knee flexion 5 5  Knee extension 5 5  Ankle dorsiflexion 4 4  Ankle plantarflexion              (Blank rows = not tested)  LOWER EXTREMITY SPECIAL TESTS:  Elbow : Pain with resisted pronation   GAIT: trunk fwd flexed posture     TODAY'S TREATMENT:                                                                                                                              DATE:   12/21/2023 Therapeutic Exercise: Aerobic: Supine:  Quadruped:  Seated: Thoracic rotation 2 x 10;  Seated hip flexion/hip hinge 2 x 10,  seated pelvic tilts x 20;  Standing:   elbow extension and elbow supination stretches 20 sec x 5 ea;   Sit to stand x 10, slow  Elbow flex/bicep curls 10 lb x 10  regular ;  supination 5lb x 15 on R;  Rows Blue TB x 20  Stretches:   supine: pec stretch 30 sec x 3;  Gastroc stretch 30 sec x 3 at Ende, and on step Ankle DF glides x 10 bil on step  Neuromuscular Re-education: Manual Therapy:   Therapeutic Activity: Self Care:   Previous: Therapeutic Exercise: Aerobic: Supine: SKTC with towel roll 30 sec  3 bil; Hip ER fallouts x 15;  Quadruped: Cat/cow with education for HEP, x 15;  Seated: Seated hip flexion/hip hinge 2 x 10,  seated pelvic tilts x 20;  Standing: standing lumbar extension 2 x 10 (education for HEP after sitting for a while). Fwd lumbar flexion 30 sec x 3;   elbow extension and elbow supination stretches 20 sec x 5 ea;   Elbow flex/bicep curls 10 lb x 10 hammer curl, x 10 regular ;  supination 5lb x 15 on R;  Stretches:   supine: elbow extension stretch, with wrist extension 2 x 10, arm on towel roll;   Neuromuscular Re-education: Manual Therapy: Elbow ROM for flex, ext, and pron/sup. DTM to medial elbow musculature. Elbow mobs for extension.  Therapeutic Activity: Self Care:   PATIENT EDUCATION:  Education details: PT POC, Exam findings, HEP Person educated: Patient Education method: Explanation, Demonstration, Tactile cues, Verbal cues, and Handouts Education comprehension: verbalized understanding, returned demonstration, verbal  cues required, tactile cues required, and  needs further education   HOME EXERCISE PROGRAM: Access Code: [email or web address] URL: [email or web address] Date: 12/21/2023 Prepared by: Sedalia Muta    ASSESSMENT:  CLINICAL IMPRESSION: Pt with improving pain in elbow. He has mild pain reproduced today resisted pron/sup, but overall pain much improved. Review of most important hip and lumbar mobility for HEP, as well as posture/rows. Educated on ankle mobility and stretches today, added to HEP. Plan to review ankle stretches and further work on hip mobility next session.   Eval: Patient presents with primary complaint of  stiffness in bil ankles as well as L hip. He has significant hypomobility in L hip, lumbar spine,  as well as ankles and bil great toes. He will benefit from improving ROM for LE s. He also has painful R elbow, medially. He has decreased ability for full functional activities, lifting, carrying, gripping and exercise, due to pain . Pt will  benefit from skilled PT to improve deficits and pain and to return to PLOF.   OBJECTIVE IMPAIRMENTS: Abnormal gait, decreased activity tolerance, decreased mobility, decreased ROM, decreased strength, hypomobility, increased muscle spasms, impaired UE functional use, and pain.   ACTIVITY LIMITATIONS: carrying, lifting, bending, sitting, standing, squatting, stairs, and locomotion level  PARTICIPATION LIMITATIONS: meal prep, laundry, driving, shopping, community activity, and yard work  PERSONAL FACTORS: Past/current experiences and Time since onset of injury/illness/exacerbation are also affecting patient's functional outcome.   REHAB POTENTIAL: Good  CLINICAL DECISION MAKING: Stable/uncomplicated  EVALUATION COMPLEXITY: Low   GOALS: Goals reviewed with patient? Yes  SHORT TERM GOALS: Target date: 12/05/23  Pt to be independent with initial HEP  Goal status: INITIAL  2. Pt to report decreased pain in R elbow to 0-4/10 with  activity     LONG TERM GOALS: Target date: 01/16/24  Pt to be independent with final HEP  Goal status: INITIAL  2.  Pt to demo improved ankle and hip ROM to be at max ability to improve ability for bending, lifting, and IADLs.   Goal status: INITIAL  3.  Pt to report decreased pain in R elbow to 0-2/10 with ADLs, IADLs, and golf .   Goal status: INITIAL  4.  Pt to report improvement in overall symptoms by at least 50 %.   Goal status: INITIAL    PLAN:  PT FREQUENCY: 1-2x/week  PT DURATION: 8 weeks  PLANNED INTERVENTIONS: Therapeutic exercises, Therapeutic activity, Neuromuscular re-education, Patient/Family education, Self Care, Joint mobilization, Joint manipulation, Stair training, Orthotic/Fit training, DME instructions, Aquatic Therapy, Dry Needling, Electrical stimulation, Cryotherapy, Moist heat, Taping, Ultrasound, Ionotophoresis 4mg /ml Dexamethasone, Manual therapy,  Vasopneumatic device, Traction, Spinal manipulation, Spinal mobilization,Balance training, Gait training,   PLAN FOR NEXT SESSION: ankle, foot stiffness,  L hip and back stiffness, R elbow pain.    Sedalia Muta, PT, DPT 2:40 PM  12/21/23

## 2023-12-26 ENCOUNTER — Ambulatory Visit: Payer: Medicare PPO | Admitting: Physical Therapy

## 2023-12-26 ENCOUNTER — Encounter: Payer: Self-pay | Admitting: Physical Therapy

## 2023-12-26 DIAGNOSIS — M25561 Pain in right knee: Secondary | ICD-10-CM

## 2023-12-26 DIAGNOSIS — M25671 Stiffness of right ankle, not elsewhere classified: Secondary | ICD-10-CM

## 2023-12-26 DIAGNOSIS — M25672 Stiffness of left ankle, not elsewhere classified: Secondary | ICD-10-CM

## 2023-12-26 DIAGNOSIS — M25521 Pain in right elbow: Secondary | ICD-10-CM

## 2023-12-26 DIAGNOSIS — M25652 Stiffness of left hip, not elsewhere classified: Secondary | ICD-10-CM | POA: Diagnosis not present

## 2023-12-26 NOTE — Therapy (Signed)
OUTPATIENT PHYSICAL THERAPY LOWER EXTREMITY TREATMENT   Patient Name: Troy [name] MRN: [id number] DOB:Sep 08, 1951, 73 y.o., male Today's Date: 12/26/2023    END OF SESSION:  PT End of Session - 12/26/23 1105     Visit Number 5    Number of Visits 16    Date for PT Re-Evaluation 01/16/24    Authorization Type [employer]    PT Start Time 1103    PT Stop Time 1145    PT Time Calculation (min) 42 min    Activity Tolerance Patient tolerated treatment well    Behavior During Therapy WFL for tasks assessed/performed               Past Medical History:  Diagnosis Date   BPH (benign prostatic hyperplasia)    Past Surgical History:  Procedure Laterality Date   COLONOSCOPY  11/24/2005   STARK-TICS ONLY   EYE SURGERY Bilateral 11/23/2003   lasik   JOINT REPLACEMENT Left 11/22/2005   Lt THR - Dr Sherlean Foot   POLYPECTOMY  2017   TA x 2   Patient Active Problem List   Diagnosis Date Noted   Essential hypertension 03/24/2023   Degenerative disc disease, cervical 05/15/2020   Benign prostatic hyperplasia 05/22/2019   Erectile dysfunction 05/22/2019   Gout 01/08/2019   Degenerative disc disease, lumbar 12/11/2018   Nonallopathic lesion of sacral region 12/11/2018   Nonallopathic lesion of lumbar region 12/11/2018   Nonallopathic lesion of thoracic region 12/11/2018   Dyslipidemia 08/20/2014   Osteoarthritis 07/15/2009     PCP: Jacquiline Doe  REFERRING PROVIDER: Jacquiline Doe  REFERRING DIAG: poor mobility  THERAPY DIAG:  Pain in right elbow  Stiffness of left ankle, not elsewhere classified  Stiffness of right ankle, not elsewhere classified  Acute pain of right knee  Stiffness of hip joint, left  Rationale for Evaluation and Treatment: Rehabilitation  ONSET DATE:    SUBJECTIVE:   SUBJECTIVE STATEMENT: Pt states elbow feeling good. Feels he is doing well with exercises.   Eval: Pt states several areas of concern. Most pain is in R elbow, medial elbow, pain  present for about 2 months.  No wrist pain , states elbow hurts during golf with impact, hurts with lifting weights and bicep curls.  Reports much stiffness in Bil ankles, no pain .  L hip: does not hurt, but but feels very stiff .  R knee: painful in the past, stays swollen, just the right , no pain on L.     PERTINENT HISTORY: OA,   PAIN:  Are you having pain? Yes: NPRS scale: 0/10 Pain location: R medial elbow  Pain description: sore, weak Aggravating factors: golf, weight lifting  Relieving factors: rest  Are you having pain? Yes: NPRS scale: 0/10 Pain location: Ankles feel stiff  Pain description: no pain, just stiff  Aggravating factors:  Relieving factors:   Are you having pain? Yes: NPRS scale: 0/10 Pain location: L hip Pain description: stiff Aggravating factors: sitting, increased activity  Relieving factors: none stated   PRECAUTIONS: None  WEIGHT BEARING RESTRICTIONS: No  FALLS:  Has patient fallen in last 6 months? No   PLOF: Independent  PATIENT GOALS:  Decreased pain   NEXT MD VISIT:   OBJECTIVE:   DIAGNOSTIC FINDINGS:   PATIENT SURVEYS:    COGNITION: Overall cognitive status: Within functional limits for tasks assessed     SENSATION: WFL  EDEMA: mild around R knee cap    POSTURE:    No Significant postural limitations  PALPATION: Hypomobile L hip R knee: mild fluid around kneecap Hypomobile bil ankles and bil great toe  Elbow: painful ulnar head, mild pain into pronator   LOWER EXTREMITY ROM:   L hip: flexion and ER significant stiffness    R hip: flexion mild/mod limitation.  Bil hallux rigidus: ; ankles mild/mod limitation for inv/ev.  Elbow: WFL   LOWER EXTREMITY MMT:  MMT Left eval Right  eval  Hip flexion 4 4  Hip extension    Hip abduction 4 4  Hip adduction    Hip internal rotation    Hip external rotation    Knee flexion 5 5  Knee extension 5 5  Ankle dorsiflexion 4 4  Ankle plantarflexion              (Blank rows = not tested)  LOWER EXTREMITY SPECIAL TESTS:  Elbow : Pain with resisted pronation   GAIT: trunk fwd flexed posture     TODAY'S TREATMENT:                                                                                                                              DATE:   12/26/2023 Therapeutic Exercise: Aerobic: Supine:   hip ER butterfly x 15;  shoulder ER  at 90 deg x 15;  Seated: Thoracic rotation 2 x 10;   Standing:    elbow extension and elbow supination stretches 20 sec x 5 ea;   Elbow flex/bicep curls 10 lb x 10  regular ;  supination 5lb x 15 on R;  Rows Blue TB x 20  Stretches:   supine: pec stretch 2 min, 1 min with wrist flex/ext for forearm stretch;  Neuromuscular Re-education:  Seated hip flexion/hip hinge 2 x 10, Posture at [place] 3 sec x 10 with education on optimal posture, head, shoulders, back  Walking- with education on optimal head and shoulder posture  Manual Therapy:   Therapeutic Activity: Self Care:   Previous: Therapeutic Exercise: Aerobic: Supine: SKTC with towel roll 30 sec  3 bil; Hip ER fallouts x 15;  Quadruped: Cat/cow with education for HEP, x 15;  Seated: Seated hip flexion/hip hinge 2 x 10,  seated pelvic tilts x 20;  Standing: standing lumbar extension 2 x 10 (education for HEP after sitting for a while). Fwd lumbar flexion 30 sec x 3;   elbow extension and elbow supination stretches 20 sec x 5 ea;   Elbow flex/bicep curls 10 lb x 10 hammer curl, x 10 regular ;  supination 5lb x 15 on R;  Stretches:   supine: elbow extension stretch, with wrist extension 2 x 10, arm on towel roll;   Neuromuscular Re-education: Manual Therapy: Elbow ROM for flex, ext, and pron/sup. DTM to medial elbow musculature. Elbow mobs for extension.  Therapeutic Activity: Self Care:   PATIENT EDUCATION:  Education details: PT POC, Exam findings, HEP Person educated: Patient Education method: Explanation, Demonstration, Tactile cues, Verbal cues, and  Handouts  Education comprehension: verbalized understanding, returned demonstration, verbal cues required, tactile cues required, and needs further education   HOME EXERCISE PROGRAM: Access Code: [email or web address] URL: [email or web address] Date: 12/21/2023 Prepared by: Sedalia Muta    ASSESSMENT:  CLINICAL IMPRESSION: Pt with improving pain in elbow. He reports no pain in elbow, hip , knee or ankles. Education on continuing to work on mobility for all joints. Reviewed HEP for elbow, head/posture, and hip. Pt doing well at this time, now independent with HEP and pain free. He is ready for d/c to HEP, pt in agreement with plan. He will be transitioning to new workout routine/gym.   Eval: Patient presents with primary complaint of  stiffness in bil ankles as well as L hip. He has significant hypomobility in L hip, lumbar spine,  as well as ankles and bil great toes. He will benefit from improving ROM for LE s. He also has painful R elbow, medially. He has decreased ability for full functional activities, lifting, carrying, gripping and exercise, due to pain . Pt will  benefit from skilled PT to improve deficits and pain and to return to PLOF.   OBJECTIVE IMPAIRMENTS: Abnormal gait, decreased activity tolerance, decreased mobility, decreased ROM, decreased strength, hypomobility, increased muscle spasms, impaired UE functional use, and pain.   ACTIVITY LIMITATIONS: carrying, lifting, bending, sitting, standing, squatting, stairs, and locomotion level  PARTICIPATION LIMITATIONS: meal prep, laundry, driving, shopping, community activity, and yard work  PERSONAL FACTORS: Past/current experiences and Time since onset of injury/illness/exacerbation are also affecting patient's functional outcome.   REHAB POTENTIAL: Good  CLINICAL DECISION MAKING: Stable/uncomplicated  EVALUATION COMPLEXITY: Low   GOALS: Goals reviewed with patient? Yes  SHORT TERM GOALS: Target date: 12/05/23  Pt  to be independent with initial HEP  Goal status: MET  2. Pt to report decreased pain in R elbow to 0-4/10 with activity     Goal status: MET   LONG TERM GOALS: Target date: 01/16/24  Pt to be independent with final HEP  Goal status: MET  2.  Pt to demo improved ankle and hip ROM to be at max ability to improve ability for bending, lifting, and IADLs.   Goal status: MET  3.  Pt to report decreased pain in R elbow to 0-2/10 with ADLs, IADLs, and golf .   Goal status: MET  4.  Pt to report improvement in overall symptoms by at least 50 %.   Goal status: MET    PLAN:  PT FREQUENCY: 1-2x/week  PT DURATION: 8 weeks  PLANNED INTERVENTIONS: Therapeutic exercises, Therapeutic activity, Neuromuscular re-education, Patient/Family education, Self Care, Joint mobilization, Joint manipulation, Stair training, Orthotic/Fit training, DME instructions, Aquatic Therapy, Dry Needling, Electrical stimulation, Cryotherapy, Moist heat, Taping, Ultrasound, Ionotophoresis 4mg /ml Dexamethasone, Manual therapy,  Vasopneumatic device, Traction, Spinal manipulation, Spinal mobilization,Balance training, Gait training,   PLAN FOR NEXT SESSION:    Sedalia Muta, PT, DPT 11:05 AM  12/26/23   PHYSICAL THERAPY DISCHARGE SUMMARY  Visits from Start of Care: 5   Plan: Patient agrees to discharge.  Patient goals were  met. Patient is being discharged due to meeting the stated rehab goals.     Sedalia Muta, PT, DPT 11:53 AM  12/26/23

## 2023-12-30 ENCOUNTER — Other Ambulatory Visit: Payer: Self-pay | Admitting: Family Medicine

## 2024-01-25 ENCOUNTER — Ambulatory Visit: Payer: Medicare PPO

## 2024-01-25 VITALS — Ht 69.0 in | Wt 193.0 lb

## 2024-01-25 DIAGNOSIS — Z0000 Encounter for general adult medical examination without abnormal findings: Secondary | ICD-10-CM | POA: Diagnosis not present

## 2024-01-25 NOTE — Progress Notes (Addendum)
 Subjective:   Troy [name] is a 73 y.o. who presents for a Medicare Wellness preventive visit.  Visit Complete: Virtual I connected with  Doran Heater on 01/25/24 by a audio enabled telemedicine application and verified that I am speaking with the correct person using two identifiers.  Patient Location: Home  Provider Location: Home Office  I discussed the limitations of evaluation and management by telemedicine. The patient expressed understanding and agreed to proceed.  Vital Signs: Because this visit was a virtual/telehealth visit, some criteria may be missing or patient reported. Any vitals not documented were not able to be obtained and vitals that have been documented are patient reported.  VideoDeclined- This patient declined Librarian, academic. Therefore the visit was completed with audio only.  AWV Questionnaire: Yes: Patient Medicare AWV questionnaire was completed by the patient on 01/21/24; I have confirmed that all information answered by patient is correct and no changes since this date.  Cardiac Risk Factors include: advanced age (>89men, >22 women);hypertension;dyslipidemia;male gender     Objective:    Today's Vitals   01/25/24 0803  Weight: 193 lb (87.5 kg)  Height: 5\' 9"  (1.753 m)   Body mass index is 28.5 kg/m.     01/25/2024    8:06 AM 11/24/2023    4:57 PM 12/21/2022    8:05 AM 12/08/2021    8:01 AM 06/15/2021    5:55 PM 12/24/2019    3:41 PM 10/04/2016    4:21 PM  Advanced Directives  Does Patient Have a Medical Advance Directive? Yes No Yes Yes No No Yes  Type of Estate agent of [place];Living will  Healthcare Power of [place];Living will Healthcare Power of [employer] of [place];Living will  Does patient want to make changes to medical advance directive?       Yes - information given  Copy of Healthcare Power of Attorney in Chart? No - copy requested  No - copy requested No - copy  requested   No - copy requested  Would patient like information on creating a medical advance directive?  No - Patient declined    No - Patient declined     Current Medications (verified) Outpatient Encounter Medications as of 01/25/2024  Medication Sig   amLODipine (NORVASC) 5 MG tablet Take 1 tablet (5 mg total) by mouth daily.   atorvastatin (LIPITOR) 10 MG tablet TAKE 1 TABLET BY MOUTH EVERY OTHER DAY   finasteride (PROSCAR) 5 MG tablet TAKE 1 TABLET BY MOUTH DAILY   ibuprofen (ADVIL) 100 MG tablet Take by mouth.   Multiple Vitamin (MULTIVITAMIN) capsule Take by mouth.   sildenafil (REVATIO) 20 MG tablet Take 2 to 5 tablets Daily as needed   No facility-administered encounter medications on file as of 01/25/2024.    Allergies (verified) Patient has no known allergies.   History: Past Medical History:  Diagnosis Date   BPH (benign prostatic hyperplasia)    Past Surgical History:  Procedure Laterality Date   COLONOSCOPY  11/24/2005   STARK-TICS ONLY   EYE SURGERY Bilateral 11/23/2003   lasik   JOINT REPLACEMENT Left 11/22/2005   Lt THR - Dr Sherlean Foot   POLYPECTOMY  2017   TA x 2   Family History  Problem Relation Age of Onset   Heart disease Father    Cancer Brother        Lung Cancer   Colon cancer Neg Hx    Colon polyps Neg Hx  Esophageal cancer Neg Hx    Rectal cancer Neg Hx    Stomach cancer Neg Hx    Prostate cancer Neg Hx    Social History   Socioeconomic History   Marital status: Married    Spouse name: Not on file   Number of children: Not on file   Years of education: Not on file   Highest education level: Bachelor's degree (e.g., BA, AB, BS)  Occupational History   Not on file  Tobacco Use   Smoking status: Never   Smokeless tobacco: Never  Vaping Use   Vaping status: Never Used  Substance and Sexual Activity   Alcohol use: Yes    Comment: rarely   Drug use: Never   Sexual activity: Yes  Other Topics Concern   Not on file  Social History  Narrative   Not on file   Social Drivers of Health   Financial Resource Strain: Low Risk  (01/21/2024)   Overall Financial Resource Strain (CARDIA)    Difficulty of Paying Living Expenses: Not hard at all  Food Insecurity: No Food Insecurity (01/21/2024)   Hunger Vital Sign    Worried About Running Out of Food in the Last Year: Never true    Ran Out of Food in the Last Year: Never true  Transportation Needs: No Transportation Needs (01/21/2024)   PRAPARE - Administrator, Civil Service (Medical): No    Lack of Transportation (Non-Medical): No  Physical Activity: Sufficiently Active (01/21/2024)   Exercise Vital Sign    Days of Exercise per Week: 6 days    Minutes of Exercise per Session: 60 min  Stress: No Stress Concern Present (01/21/2024)   [employer] of Occupational Health - Occupational Stress Questionnaire    Feeling of Stress : Not at all  Social Connections: Moderately Integrated (01/21/2024)   Social Connection and Isolation Panel [NHANES]    Frequency of Communication with Friends and Family: Once a week    Frequency of Social Gatherings with Friends and Family: Once a week    Attends Religious Services: 1 to 4 times per year    Active Member of [employer] or Organizations: Yes    Attends Engineer, structural: More than 4 times per year    Marital Status: Married    Tobacco Counseling Counseling given: Not Answered    Clinical Intake:  Pre-visit preparation completed: Yes  Pain : No/denies pain     BMI - recorded: 28.5 Nutritional Status: BMI 25 -29 Overweight Nutritional Risks: None Diabetes: No  How often do you need to have someone help you when you read instructions, pamphlets, or other written materials from your doctor or pharmacy?: 1 - Never  Interpreter Needed?: No  Information entered by :: Lanier Ensign, LPN   Activities of Daily Living     01/21/2024    8:54 AM 12/26/2023    5:17 PM  In your present state of health, do you  have any difficulty performing the following activities:  Hearing? 0 0  Vision? 0 0  Difficulty concentrating or making decisions? 0 0  Walking or climbing stairs? 0 0  Dressing or bathing? 0 0  Doing errands, shopping? 0 0  Preparing Food and eating ? N N  Using the Toilet? N N  In the past six months, have you accidently leaked urine? N N  Do you have problems with loss of bowel control? N N  Managing your Medications? N N  Managing your Finances? N N  Housekeeping or managing your Housekeeping? N N    Patient Care Team: Ardith Dark, MD as PCP - General (Family Medicine)  Indicate any recent Medical Services you may have received from other than [facility] providers in the past year (date may be approximate).     Assessment:   This is a routine wellness examination for Devanta.  Hearing/Vision screen Hearing Screening - Comments:: Pt denies any hearing issues  Vision Screening - Comments:: Pt follows up with Dr Emily Filbert for annual eye exams    Goals Addressed             This Visit's Progress    Patient Stated       Working on mobility        Depression Screen     01/25/2024    8:06 AM 11/30/2023    9:37 AM 05/18/2023    8:46 AM 03/24/2023   11:37 AM 12/21/2022    8:04 AM 11/24/2022    9:55 AM 06/11/2022   11:33 AM  PHQ 2/9 Scores  PHQ - 2 Score 0 0 0 0 0 0 0    Fall Risk     01/21/2024    8:54 AM 12/26/2023    5:17 PM 11/30/2023    9:37 AM 05/18/2023    8:46 AM 03/24/2023   11:37 AM  Fall Risk   Falls in the past year? 0 0 0 0 0  Number falls in past yr: 0 0 0 0 0  Injury with Fall? 0 0 0 0 0  Risk for fall due to : History of fall(s)  No Fall Risks  No Fall Risks  Follow up Falls prevention discussed        MEDICARE RISK AT HOME:  Medicare Risk at Home Any stairs in or around the home?: (Patient-Rptd) No If so, are there any without handrails?: (Patient-Rptd) No Home free of loose throw rugs in walkways, pet beds, electrical cords, etc?: (Patient-Rptd)  Yes Adequate lighting in your home to reduce risk of falls?: (Patient-Rptd) Yes Life alert?: (Patient-Rptd) No Use of a cane, walker or w/c?: (Patient-Rptd) No Grab bars in the bathroom?: (Patient-Rptd) No Shower chair or bench in shower?: (Patient-Rptd) No Elevated toilet seat or a handicapped toilet?: (Patient-Rptd) No  TIMED UP AND GO:  Was the test performed?  No  Cognitive Function: 6CIT completed        01/25/2024    8:08 AM 12/21/2022    8:08 AM 12/08/2021    8:04 AM  6CIT Screen  What Year? 0 points 0 points 0 points  What month? 0 points 0 points 0 points  What time? 0 points 0 points 0 points  Count back from 20 0 points 0 points 0 points  Months in reverse 0 points 0 points 0 points  Repeat phrase 0 points 0 points 0 points  Total Score 0 points 0 points 0 points    Immunizations Immunization History  Administered Date(s) Administered   Fluad Quad(high Dose 65+) 08/16/2020, 08/14/2022   Influenza Split 10/30/2012, 08/20/2014, 08/25/2015   Influenza Whole 10/22/2005, 07/19/2010, 08/04/2011   Influenza, High Dose Seasonal PF 09/20/2016, 10/20/2017, 08/30/2018, 08/06/2019, 08/09/2023   Influenza-Unspecified 09/06/2018, 10/26/2021   PFIZER(Purple Top)SARS-COV-2 Vaccination 12/29/2019, 01/23/2020, 08/28/2020, 03/16/2021   PPD Test 08/20/2014, 08/25/2015   Pfizer Covid-19 Vaccine Bivalent Booster 73yrs & up 10/14/2021   Pfizer(Comirnaty)Fall Seasonal Vaccine 12 years and older 09/28/2022   Pneumococcal Conjugate-13 10/04/2016   Pneumococcal Polysaccharide-23 10/31/2018   Rsv, Bivalent, Protein Subunit Rsvpref,pf (  Abrysvo) 08/18/2022   Td 11/22/1998, 07/15/2009   Tdap 11/05/2019   Zoster Recombinant(Shingrix) 11/18/2021, 01/20/2022   Zoster, Live 08/14/2013    Screening Tests Health Maintenance  Topic Date Due   COVID-19 Vaccine (7 - 2024-25 season) 07/24/2023   Medicare Annual Wellness (AWV)  01/24/2025   Colonoscopy  10/19/2028   DTaP/Tdap/Td (4 - Td or  Tdap) 11/04/2029   Pneumonia Vaccine 72+ Years old  Completed   INFLUENZA VACCINE  Completed   Hepatitis C Screening  Completed   Zoster Vaccines- Shingrix  Completed   HPV VACCINES  Aged Out    Health Maintenance  Health Maintenance Due  Topic Date Due   COVID-19 Vaccine (7 - 2024-25 season) 07/24/2023   Health Maintenance Items Addressed: See Nurse Notes  Additional Screening:  Vision Screening: Recommended annual ophthalmology exams for early detection of glaucoma and other disorders of the eye.  Dental Screening: Recommended annual dental exams for proper oral hygiene  Community Resource Referral / Chronic Care Management: CRR required this visit?  No   CCM required this visit?  No     Plan:     I have personally reviewed and noted the following in the patient's chart:   Medical and social history Use of alcohol, tobacco or illicit drugs  Current medications and supplements including opioid prescriptions. Patient is not currently taking opioid prescriptions. Functional ability and status Nutritional status Physical activity Advanced directives List of other physicians Hospitalizations, surgeries, and ER visits in previous 12 months Vitals Screenings to include cognitive, depression, and falls Referrals and appointments  In addition, I have reviewed and discussed with patient certain preventive protocols, quality metrics, and best practice recommendations. A written personalized care plan for preventive services as well as general preventive health recommendations were provided to patient.     Marzella Schlein, LPN   11/27/1094   After Visit Summary: (MyChart) Due to this being a telephonic visit, the after visit summary with patients personalized plan was offered to patient via MyChart   Notes: Nothing significant to report at this time.

## 2024-01-25 NOTE — Patient Instructions (Signed)
 Mr. Stiff , Thank you for taking time to come for your Medicare Wellness Visit. I appreciate your ongoing commitment to your health goals. Please review the following plan we discussed and let me know if I can assist you in the future.   Referrals/Orders/Follow-Ups/Clinician Recommendations: maintain health and activity  This is a list of the screening recommended for you and due dates:  Health Maintenance  Topic Date Due   COVID-19 Vaccine (7 - 2024-25 season) 07/24/2023   Medicare Annual Wellness Visit  01/24/2025   Colon Cancer Screening  10/19/2028   DTaP/Tdap/Td vaccine (4 - Td or Tdap) 11/04/2029   Pneumonia Vaccine  Completed   Flu Shot  Completed   Hepatitis C Screening  Completed   Zoster (Shingles) Vaccine  Completed   HPV Vaccine  Aged Out    Advanced directives: (Copy Requested) Please bring a copy of your health care power of attorney and living will to the office to be added to your chart at your convenience.  Next Medicare Annual Wellness Visit scheduled for next year: Yes

## 2024-02-22 DIAGNOSIS — D485 Neoplasm of uncertain behavior of skin: Secondary | ICD-10-CM | POA: Diagnosis not present

## 2024-02-22 DIAGNOSIS — L814 Other melanin hyperpigmentation: Secondary | ICD-10-CM | POA: Diagnosis not present

## 2024-02-22 DIAGNOSIS — Z85828 Personal history of other malignant neoplasm of skin: Secondary | ICD-10-CM | POA: Diagnosis not present

## 2024-02-22 DIAGNOSIS — L570 Actinic keratosis: Secondary | ICD-10-CM | POA: Diagnosis not present

## 2024-02-22 DIAGNOSIS — D225 Melanocytic nevi of trunk: Secondary | ICD-10-CM | POA: Diagnosis not present

## 2024-02-22 DIAGNOSIS — L821 Other seborrheic keratosis: Secondary | ICD-10-CM | POA: Diagnosis not present

## 2024-02-22 DIAGNOSIS — C44519 Basal cell carcinoma of skin of other part of trunk: Secondary | ICD-10-CM | POA: Diagnosis not present

## 2024-03-14 DIAGNOSIS — M792 Neuralgia and neuritis, unspecified: Secondary | ICD-10-CM | POA: Diagnosis not present

## 2024-03-14 DIAGNOSIS — B351 Tinea unguium: Secondary | ICD-10-CM | POA: Diagnosis not present

## 2024-03-14 DIAGNOSIS — L600 Ingrowing nail: Secondary | ICD-10-CM | POA: Diagnosis not present

## 2024-04-04 DIAGNOSIS — L600 Ingrowing nail: Secondary | ICD-10-CM | POA: Diagnosis not present

## 2024-04-04 DIAGNOSIS — M792 Neuralgia and neuritis, unspecified: Secondary | ICD-10-CM | POA: Diagnosis not present

## 2024-04-05 ENCOUNTER — Ambulatory Visit (INDEPENDENT_AMBULATORY_CARE_PROVIDER_SITE_OTHER)

## 2024-04-05 ENCOUNTER — Ambulatory Visit: Admitting: Sports Medicine

## 2024-04-05 VITALS — BP 118/84 | HR 78 | Ht 69.0 in | Wt 197.0 lb

## 2024-04-05 DIAGNOSIS — M25521 Pain in right elbow: Secondary | ICD-10-CM | POA: Diagnosis not present

## 2024-04-05 DIAGNOSIS — S59901D Unspecified injury of right elbow, subsequent encounter: Secondary | ICD-10-CM | POA: Diagnosis not present

## 2024-04-05 DIAGNOSIS — M79601 Pain in right arm: Secondary | ICD-10-CM

## 2024-04-05 DIAGNOSIS — S6991XD Unspecified injury of right wrist, hand and finger(s), subsequent encounter: Secondary | ICD-10-CM | POA: Diagnosis not present

## 2024-04-05 DIAGNOSIS — M1811 Unilateral primary osteoarthritis of first carpometacarpal joint, right hand: Secondary | ICD-10-CM | POA: Diagnosis not present

## 2024-04-05 DIAGNOSIS — M79641 Pain in right hand: Secondary | ICD-10-CM | POA: Diagnosis not present

## 2024-04-05 DIAGNOSIS — M79631 Pain in right forearm: Secondary | ICD-10-CM | POA: Diagnosis not present

## 2024-04-05 DIAGNOSIS — R609 Edema, unspecified: Secondary | ICD-10-CM | POA: Diagnosis not present

## 2024-04-05 MED ORDER — MELOXICAM 15 MG PO TABS: 15.0000 mg | ORAL_TABLET | Freq: Every day | ORAL | 0 refills | Status: DC

## 2024-04-05 NOTE — Patient Instructions (Signed)
-   Start meloxicam  15 mg daily x2 weeks.  If still having pain after 2 weeks, complete 3rd-week of NSAID. May use remaining NSAID as needed once daily for pain control.  Do not to use additional over-the-counter NSAIDs (ibuprofen, naproxen, Advil, Aleve, etc.) while taking prescription NSAIDs.  May use Tylenol  [id number] mg 2 to 3 times a day for breakthrough pain. Forarm  Elevate arm above heart as much as possible  As needed follow up if no improvement 3 week follow up

## 2024-04-05 NOTE — Progress Notes (Signed)
 Troy [name] D.[facility] [address] [place] [address] Phone: [phone]   Assessment and Plan:     1. Right arm pain -Acute, uncomplicated, initial visit - Pain over right olecranon, and swelling in right upper extremity distal to right elbow likely caused from contusion from patient falling onto right elbow 1 week ago. - X-rays obtained in clinic.  My interpretation: No acute fracture or dislocation.  Degenerative changes most significant at [facility].  Calcification along triceps tendon.  Will await official radiology review - Start meloxicam  15 mg daily x2 weeks.  If still having pain after 2 weeks, complete 3rd-week of NSAID. May use remaining NSAID as needed once daily for pain control.  Do not to use additional over-the-counter NSAIDs (ibuprofen, naproxen, Advil, Aleve, etc.) while taking prescription NSAIDs.  May use Tylenol  [phone] mg 2 to 3 times a day for breakthrough pain. - Recommend elevation of right arm over heart to decrease swelling - Start HEP for elbow and forearm to prevent stiffness   Pertinent previous records reviewed include none  15 additional minutes spent for educating Therapeutic Home Exercise Program.  This included exercises focusing on stretching, strengthening, with focus on eccentric aspects.   Long term goals include an improvement in range of motion, strength, endurance as well as avoiding reinjury. Patient's frequency would include in 1-2 times a day, 3-5 times a week for a duration of 6-12 weeks. Proper technique shown and discussed handout in great detail with ATC.  All questions were discussed and answered.    Follow Up: As needed if no improvement or worsening of symptoms.  Could consider ultrasound versus CSI versus physical therapy   Subjective:   I, Troy [name], am serving as a Neurosurgeon for Doctor Troy [name]  Chief Complaint: right arm pain   HPI:   04/05/24 Patient is a 73 year old male  with right arm pain. Patient states he slipped on some pavers last Wednesday. He has some swelling through arm. ROM has improved but still limited. Tylenol  and ibu for the pain. No numbness or tingling. Grip strength intact.   Relevant Historical Information: Hypertension, gout  Additional pertinent review of systems negative.   Current Outpatient Medications:    amLODipine  (NORVASC ) 5 MG tablet, Take 1 tablet (5 mg total) by mouth daily., Disp: 90 tablet, Rfl: 3   atorvastatin  (LIPITOR) 10 MG tablet, TAKE 1 TABLET BY MOUTH EVERY OTHER DAY, Disp: 45 tablet, Rfl: 3   finasteride  (PROSCAR ) 5 MG tablet, TAKE 1 TABLET BY MOUTH DAILY, Disp: 90 tablet, Rfl: 3   ibuprofen (ADVIL) 100 MG tablet, Take by mouth., Disp: , Rfl:    meloxicam  (MOBIC ) 15 MG tablet, Take 1 tablet (15 mg total) by mouth daily., Disp: 30 tablet, Rfl: 0   Multiple Vitamin (MULTIVITAMIN) capsule, Take by mouth., Disp: , Rfl:    sildenafil  (REVATIO ) 20 MG tablet, Take 2 to 5 tablets Daily as needed, Disp: 90 tablet, Rfl: 9   Objective:     Vitals:   04/05/24 0919  BP: 118/84  Pulse: 78  SpO2: 98%  Weight: 197 lb (89.4 kg)  Height: 5\' 9"  (1.753 m)      Body mass index is 29.09 kg/m.    Physical Exam:    General: Appears well, no acute distress, nontoxic and pleasant Neck: FROM, no pain Neuro: sensation is intact distally with no deficits, strenghth is 5/5 in elbow flexors/extenders/supinator/pronators and wrist flexors/extensors Psych: no evidence of anxiety or depression  Right elbow: No deformity,  or muscle wasting Generalized effusion throughout right elbow, forearm, hand Normal Carrying angle ROM:0-140, supination and pronation 90 TTP olecranon NTTP over triceps, ticeps tendon,  , lat epicondyle, medial epicondyle, antecubital fossa, biceps tendon, supinator, pronator Negative tinnels over cubital tunnel No pain with resisted wrist and middle digit extension No pain with resisted wrist flexion No pain  with resisted supination No pain with resisted pronation Negative valgus stress Negative varus stress Negative milking maneuver    Electronically signed by:  Marshall Skeeter D.[facility] 11:11 AM 04/05/24

## 2024-04-06 ENCOUNTER — Ambulatory Visit: Payer: Self-pay | Admitting: Sports Medicine

## 2024-04-25 DIAGNOSIS — M792 Neuralgia and neuritis, unspecified: Secondary | ICD-10-CM | POA: Diagnosis not present

## 2024-04-25 DIAGNOSIS — L600 Ingrowing nail: Secondary | ICD-10-CM | POA: Diagnosis not present

## 2024-06-06 DIAGNOSIS — I8392 Asymptomatic varicose veins of left lower extremity: Secondary | ICD-10-CM | POA: Diagnosis not present

## 2024-06-06 DIAGNOSIS — C4402 Squamous cell carcinoma of skin of lip: Secondary | ICD-10-CM | POA: Diagnosis not present

## 2024-06-06 DIAGNOSIS — Z85828 Personal history of other malignant neoplasm of skin: Secondary | ICD-10-CM | POA: Diagnosis not present

## 2024-06-27 DIAGNOSIS — Z85828 Personal history of other malignant neoplasm of skin: Secondary | ICD-10-CM | POA: Diagnosis not present

## 2024-06-27 DIAGNOSIS — C4402 Squamous cell carcinoma of skin of lip: Secondary | ICD-10-CM | POA: Diagnosis not present

## 2024-07-23 ENCOUNTER — Other Ambulatory Visit: Payer: Self-pay | Admitting: Sports Medicine

## 2024-07-23 ENCOUNTER — Other Ambulatory Visit: Payer: Self-pay | Admitting: Family Medicine

## 2024-08-29 DIAGNOSIS — L905 Scar conditions and fibrosis of skin: Secondary | ICD-10-CM | POA: Diagnosis not present

## 2024-08-29 DIAGNOSIS — Z85828 Personal history of other malignant neoplasm of skin: Secondary | ICD-10-CM | POA: Diagnosis not present

## 2024-08-29 DIAGNOSIS — L570 Actinic keratosis: Secondary | ICD-10-CM | POA: Diagnosis not present

## 2024-08-29 DIAGNOSIS — C4402 Squamous cell carcinoma of skin of lip: Secondary | ICD-10-CM | POA: Diagnosis not present

## 2024-10-01 ENCOUNTER — Telehealth: Payer: Self-pay | Admitting: Sports Medicine

## 2024-10-01 ENCOUNTER — Other Ambulatory Visit: Payer: Self-pay | Admitting: Sports Medicine

## 2024-10-01 ENCOUNTER — Ambulatory Visit

## 2024-10-01 ENCOUNTER — Ambulatory Visit: Admitting: Sports Medicine

## 2024-10-01 VITALS — BP 120/82 | HR 72 | Ht 69.0 in | Wt 199.0 lb

## 2024-10-01 DIAGNOSIS — Z96642 Presence of left artificial hip joint: Secondary | ICD-10-CM | POA: Diagnosis not present

## 2024-10-01 DIAGNOSIS — M1711 Unilateral primary osteoarthritis, right knee: Secondary | ICD-10-CM

## 2024-10-01 DIAGNOSIS — M25551 Pain in right hip: Secondary | ICD-10-CM

## 2024-10-01 DIAGNOSIS — M25561 Pain in right knee: Secondary | ICD-10-CM

## 2024-10-01 DIAGNOSIS — M1611 Unilateral primary osteoarthritis, right hip: Secondary | ICD-10-CM

## 2024-10-01 DIAGNOSIS — G8929 Other chronic pain: Secondary | ICD-10-CM | POA: Diagnosis not present

## 2024-10-01 MED ORDER — MELOXICAM 15 MG PO TABS: ORAL_TABLET | ORAL | 0 refills | Status: DC

## 2024-10-01 NOTE — Telephone Encounter (Signed)
 Sent to 

## 2024-10-01 NOTE — Progress Notes (Signed)
Meloxicam placed

## 2024-10-01 NOTE — Telephone Encounter (Signed)
Meloxicam placed

## 2024-10-01 NOTE — Patient Instructions (Signed)
 Hip and knee HEP   4 week follow up

## 2024-10-01 NOTE — Progress Notes (Signed)
 [name] [address] [place] [address] Phone: [phone]   Assessment and Plan:     1. Primary osteoarthritis of right hip 2. Right hip pain -Chronic with exacerbation, initial visit - Severe right hip osteoarthritis and acute flare based on HPI, physical exam, x-ray imaging - X-ray obtained in clinic.  My interpretation: No acute fracture or dislocation.  Severe femoral acetabular changes with decreased joint space, acetabular spurring, femoral head spurring, sclerosis - Start meloxicam  15 mg daily x2 weeks.  If still having pain after 2 weeks, complete 3rd-week of NSAID. May use remaining NSAID as needed once daily for pain control.  Do not to use additional over-the-counter NSAIDs (ibuprofen, naproxen, Advil, Aleve, etc.) while taking prescription NSAIDs.  May use Tylenol  [phone] mg 2 to 3 times a day for breakthrough pain. - Start HEP for hip  3. Primary osteoarthritis of right knee 4. Chronic pain of right knee (Primary) -Chronic with exacerbation, initial visit - Flare of mild knee osteoarthritis flare - X-ray obtained in clinic.  My interpretation: No acute fractures or dislocation.  Mildly decreased medial joint space,  Patellar spurring - Start meloxicam  15 mg daily x2 weeks.  If still having pain after 2 weeks, complete 3rd-week of NSAID. May use remaining NSAID as needed once daily for pain control.  Do not to use additional over-the-counter NSAIDs (ibuprofen, naproxen, Advil, Aleve, etc.) while taking prescription NSAIDs.  May use Tylenol  [phone] mg 2 to 3 times a day for breakthrough pain.  -Start HEP for knee  15 additional minutes spent for educating Therapeutic Home Exercise Program.  This included exercises focusing on stretching, strengthening, with focus on eccentric aspects.   Long term goals include an improvement in range of motion, strength, endurance as well as avoiding reinjury. Patient's frequency  would include in 1-2 times a day, 3-5 times a week for a duration of 6-12 weeks. Proper technique shown and discussed handout in great detail with ATC.  All questions were discussed and answered.    Pertinent previous records reviewed include none   Follow Up: 4 weeks for reevaluation.  Could consider intra-articular knee versus hip CSI   Subjective:   I, Troy [name], am serving as a neurosurgeon for Doctor Morene Mace  Chief Complaint: right hip and knee pain   HPI:   10/01/24 Patient is a 73 year old male with right knee and hip pain. Patient states knee pain for years. Has seen Dr. Joane for arthritis.   Hip pain last couple of months. No MOI. Pain when sitting , getting up. Meloxicam  helped when he had it. No numbness or tingling. Intermittent pain. No radiating pain. Pain when he plays golf after 9 holes.    Relevant Historical Information: Hypertension, gout  Additional pertinent review of systems negative.   Current Outpatient Medications:    amLODipine  (NORVASC ) 5 MG tablet, TAKE 1 TABLET BY MOUTH DAILY, Disp: 90 tablet, Rfl: 3   atorvastatin  (LIPITOR) 10 MG tablet, TAKE 1 TABLET BY MOUTH EVERY OTHER DAY, Disp: 45 tablet, Rfl: 3   finasteride  (PROSCAR ) 5 MG tablet, TAKE 1 TABLET BY MOUTH DAILY, Disp: 90 tablet, Rfl: 3   ibuprofen (ADVIL) 100 MG tablet, Take by mouth., Disp: , Rfl:    meloxicam  (MOBIC ) 15 MG tablet, TAKE 1 TABLET BY MOUTH DAILY, Disp: 30 tablet, Rfl: 0   Multiple Vitamin (MULTIVITAMIN) capsule, Take by mouth., Disp: , Rfl:    sildenafil  (REVATIO ) 20 MG tablet, Take  2 to 5 tablets Daily as needed, Disp: 90 tablet, Rfl: 9   Objective:     Vitals:   10/01/24 1307  BP: 120/82  Pulse: 72  SpO2: 96%  Weight: 199 lb (90.3 kg)  Height: 5' 9 (1.753 m)      Body mass index is 29.39 kg/m.    Physical Exam:    General: awake, alert, and oriented no acute distress, nontoxic Skin: no suspicious lesions or rashes Neuro:sensation intact distally with  no deficits, normal muscle tone, no atrophy, strength 5/5 in all tested lower ext groups Psych: normal mood and affect, speech clear   right Hip: No deformity, swelling or wasting ROM Flexion 80, ext 20, IR 25, ER 35 NTTP over the hip flexors, greater trochanter, gluteal musculature, si joint, lumbar spine + log roll with FROM + FABER + FADIR Negative Piriformis test Negative trendelenberg Gait normal    Electronically signed by:  Odis Mace D.[name] 1:25 PM 10/01/24

## 2024-10-04 ENCOUNTER — Ambulatory Visit: Payer: Self-pay | Admitting: Sports Medicine

## 2024-10-31 ENCOUNTER — Ambulatory Visit: Admitting: Sports Medicine

## 2024-10-31 VITALS — BP 144/70 | HR 69 | Ht 69.0 in | Wt 200.8 lb

## 2024-10-31 DIAGNOSIS — M1711 Unilateral primary osteoarthritis, right knee: Secondary | ICD-10-CM

## 2024-10-31 DIAGNOSIS — M25551 Pain in right hip: Secondary | ICD-10-CM | POA: Diagnosis not present

## 2024-10-31 DIAGNOSIS — M1611 Unilateral primary osteoarthritis, right hip: Secondary | ICD-10-CM | POA: Diagnosis not present

## 2024-10-31 DIAGNOSIS — M25561 Pain in right knee: Secondary | ICD-10-CM | POA: Diagnosis not present

## 2024-10-31 DIAGNOSIS — G8929 Other chronic pain: Secondary | ICD-10-CM | POA: Diagnosis not present

## 2024-10-31 MED ORDER — MELOXICAM 15 MG PO TABS: 15.0000 mg | ORAL_TABLET | Freq: Every day | ORAL | 0 refills | Status: AC | PRN

## 2024-10-31 NOTE — Progress Notes (Signed)
 Troy [name] D.[facility] [address] [place] [address] Phone: [phone]   Assessment and Plan:     1. Primary osteoarthritis of right hip (Primary) 2. Right hip pain 3. Primary osteoarthritis of right knee 4. Chronic pain of right knee -Chronic with exacerbation, subsequent visit - Consistent with resolving flare of severe right hip osteoarthritis and mild knee osteoarthritis after completing course of meloxicam , starting HEP - Use meloxicam  15 mg daily as needed for breakthrough pain.  Recommend limiting chronic NSAIDs to 1-2 doses per week to prevent long-term side effects. Use Tylenol  500 to 1000 mg tablets 2-3 times a day as needed for day-to-day pain relief.    - Continue HEP for hip and knee  Pertinent previous records reviewed include none   Follow Up: As needed if no improvement or worsening of symptoms.  Could consider repeat meloxicam  course versus CSI   Subjective:   I, Troy [name] am a scribe for Dr. Leonce.   Chief Complaint: right hip and knee  HPI:   10/01/24 Patient is a 73 year old male with right knee and hip pain. Patient states knee pain for years. Has seen Dr. Joane for arthritis.    Hip pain last couple of months. No MOI. Pain when sitting , getting up. Meloxicam  helped when he had it. No numbness or tingling. Intermittent pain. No radiating pain. Pain when he plays golf after 9 holes.  10/31/24 Patient states that today is a follow up on his hip and knees. Had some pain and swelling. Feeling much better after the meloxicam .   Relevant Historical Information: hypertension, gout  Additional pertinent review of systems negative.   Current Outpatient Medications:    amLODipine  (NORVASC ) 5 MG tablet, TAKE 1 TABLET BY MOUTH DAILY, Disp: 90 tablet, Rfl: 3   atorvastatin  (LIPITOR) 10 MG tablet, TAKE 1 TABLET BY MOUTH EVERY OTHER DAY, Disp: 45 tablet, Rfl: 3   finasteride  (PROSCAR ) 5 MG tablet, TAKE 1  TABLET BY MOUTH DAILY, Disp: 90 tablet, Rfl: 3   ibuprofen (ADVIL) 100 MG tablet, Take by mouth., Disp: , Rfl:    meloxicam  (MOBIC ) 15 MG tablet, Take 1 tablet daily for 2 weeks.  If still in pain after 2 weeks, take 1 tablet daily for an additional 1 week., Disp: 30 tablet, Rfl: 0   meloxicam  (MOBIC ) 15 MG tablet, Take 1 tablet (15 mg total) by mouth daily as needed for pain., Disp: 30 tablet, Rfl: 0   Multiple Vitamin (MULTIVITAMIN) capsule, Take by mouth., Disp: , Rfl:    sildenafil  (REVATIO ) 20 MG tablet, Take 2 to 5 tablets Daily as needed, Disp: 90 tablet, Rfl: 9   Objective:     Vitals:   10/31/24 1316  BP: (!) 144/70  Pulse: 69  SpO2: 98%  Weight: 200 lb 12.8 oz (91.1 kg)  Height: 5' 9 (1.753 m)      Body mass index is 29.65 kg/m.    Physical Exam:    General: awake, alert, and oriented no acute distress, nontoxic Skin: no suspicious lesions or rashes Neuro:sensation intact distally with no deficits, normal muscle tone, no atrophy, strength 5/5 in all tested lower ext groups Psych: normal mood and affect, speech clear   Right hip: No deformity, swelling or wasting ROM Flexion 90, ext 30, IR 45, ER 45 NTTP over the hip flexors, greater trochanter, gluteal musculature, si joint, lumbar spine   Right knee: No swelling No deformity Neg fluid wave, joint milking  ROM Flex 110, Ext 0 NTTP over the quad tendon, medial fem condyle, lat fem condyle, patella, patella tendon, tibial tuberostiy, fibular head, posterior fossa, pes anserine bursa, gerdy's tubercle, medial jt line, lateral jt line  Gait normal    Electronically signed by:  Odis Mace D.[facility] 1:26 PM 10/31/24

## 2024-10-31 NOTE — Patient Instructions (Signed)
-   Use meloxicam  15 mg daily as needed for breakthrough pain.  Recommend limiting chronic NSAIDs to 1-2 doses per week to prevent long-term side effects. Use Tylenol  500 to 1000 mg tablets 2-3 times a day as needed for day-to-day pain relief.    Meloxicam  refill   Continue HEP   As needed follow up

## 2024-11-29 ENCOUNTER — Other Ambulatory Visit: Payer: Self-pay | Admitting: Sports Medicine

## 2024-12-05 ENCOUNTER — Encounter: Payer: Self-pay | Admitting: Family Medicine

## 2024-12-05 ENCOUNTER — Ambulatory Visit (INDEPENDENT_AMBULATORY_CARE_PROVIDER_SITE_OTHER): Payer: Medicare PPO | Admitting: Family Medicine

## 2024-12-05 VITALS — BP 150/80 | HR 77 | Temp 97.6°F | Ht 69.0 in | Wt 196.6 lb

## 2024-12-05 DIAGNOSIS — M1991 Primary osteoarthritis, unspecified site: Secondary | ICD-10-CM | POA: Diagnosis not present

## 2024-12-05 DIAGNOSIS — N529 Male erectile dysfunction, unspecified: Secondary | ICD-10-CM | POA: Diagnosis not present

## 2024-12-05 DIAGNOSIS — E785 Hyperlipidemia, unspecified: Secondary | ICD-10-CM | POA: Diagnosis not present

## 2024-12-05 DIAGNOSIS — I10 Essential (primary) hypertension: Secondary | ICD-10-CM | POA: Diagnosis not present

## 2024-12-05 DIAGNOSIS — Z0001 Encounter for general adult medical examination with abnormal findings: Secondary | ICD-10-CM

## 2024-12-05 DIAGNOSIS — N400 Enlarged prostate without lower urinary tract symptoms: Secondary | ICD-10-CM | POA: Diagnosis not present

## 2024-12-05 DIAGNOSIS — Z131 Encounter for screening for diabetes mellitus: Secondary | ICD-10-CM

## 2024-12-05 LAB — COMPREHENSIVE METABOLIC PANEL WITH GFR
ALT: 31 U/L (ref 3–53)
AST: 28 U/L (ref 5–37)
Albumin: 4.8 g/dL (ref 3.5–5.2)
Alkaline Phosphatase: 55 U/L (ref 39–117)
BUN: 13 mg/dL (ref 6–23)
CO2: 24 meq/L (ref 19–32)
Calcium: 9.8 mg/dL (ref 8.4–10.5)
Chloride: 103 meq/L (ref 96–112)
Creatinine, Ser: 0.81 mg/dL (ref 0.40–1.50)
GFR: 87.2 mL/min
Glucose, Bld: 86 mg/dL (ref 70–99)
Potassium: 4.3 meq/L (ref 3.5–5.1)
Sodium: 135 meq/L (ref 135–145)
Total Bilirubin: 0.6 mg/dL (ref 0.2–1.2)
Total Protein: 7.6 g/dL (ref 6.0–8.3)

## 2024-12-05 LAB — TSH: TSH: 2.1 u[IU]/mL (ref 0.35–5.50)

## 2024-12-05 LAB — CBC
HCT: 43.7 % (ref 39.0–52.0)
Hemoglobin: 15.4 g/dL (ref 13.0–17.0)
MCHC: 35.3 g/dL (ref 30.0–36.0)
MCV: 91.5 fl (ref 78.0–100.0)
Platelets: 257 K/uL (ref 150.0–400.0)
RBC: 4.77 Mil/uL (ref 4.22–5.81)
RDW: 13.2 % (ref 11.5–15.5)
WBC: 5.3 K/uL (ref 4.0–10.5)

## 2024-12-05 LAB — LIPID PANEL
Cholesterol: 143 mg/dL (ref 28–200)
HDL: 41.2 mg/dL
LDL Cholesterol: 82 mg/dL (ref 10–99)
NonHDL: 101.69
Total CHOL/HDL Ratio: 3
Triglycerides: 96 mg/dL (ref 10.0–149.0)
VLDL: 19.2 mg/dL (ref 0.0–40.0)

## 2024-12-05 LAB — HEMOGLOBIN A1C: Hgb A1c MFr Bld: 5.4 % (ref 4.6–6.5)

## 2024-12-05 LAB — PSA: PSA: 2.13 ng/mL (ref 0.10–4.00)

## 2024-12-05 NOTE — Assessment & Plan Note (Addendum)
 Blood pressure mildly elevated today. Typically well-controlled at home.  He will continue amlodipine  5 mg daily and let us  know if persistently elevated at home.

## 2024-12-05 NOTE — Assessment & Plan Note (Signed)
 He is doing very well on Lipitor 10 mg every other day.  Check labs today.

## 2024-12-05 NOTE — Assessment & Plan Note (Signed)
Stable on sildenafil as needed.  Does not need refill today. 

## 2024-12-05 NOTE — Assessment & Plan Note (Signed)
 Check PSA.  He is on finasteride  5 mg daily.  Tolerating well.

## 2024-12-05 NOTE — Assessment & Plan Note (Signed)
Continue management per sports medicine. 

## 2024-12-05 NOTE — Progress Notes (Signed)
 "  Chief Complaint:  Troy [name] is a 74 y.o. male who presents today for his annual comprehensive physical exam.    Assessment/Plan:  Chronic Problems Addressed Today: Dyslipidemia He is doing very well on Lipitor 10 mg every other day.  Check labs today.  Benign prostatic hyperplasia Check PSA.  He is on finasteride  5 mg daily.  Tolerating well.  Osteoarthritis Continue management per sports medicine.  Erectile dysfunction Stable on sildenafil  as needed.  Does not need refill today.  Essential hypertension Blood pressure mildly elevated today. Typically well-controlled at home.  He will continue amlodipine  5 mg daily and let us  know if persistently elevated at home.  Preventative Healthcare: Check labs.  Up-to-date on vaccines and screenings.  Due for colonoscopy in 2029  Patient Counseling(The following topics were reviewed and/or handout was given):  -Nutrition: Stressed importance of moderation in sodium/caffeine intake, saturated fat and cholesterol, caloric balance, sufficient intake of fresh fruits, vegetables, and fiber.  -Stressed the importance of regular exercise.   -Substance Abuse: Discussed cessation/primary prevention of tobacco, alcohol, or other drug use; driving or other dangerous activities under the influence; availability of treatment for abuse.   -Injury prevention: Discussed safety belts, safety helmets, smoke detector, smoking near bedding or upholstery.   -Sexuality: Discussed sexually transmitted diseases, partner selection, use of condoms, avoidance of unintended pregnancy and contraceptive alternatives.   -Dental health: Discussed importance of regular tooth brushing, flossing, and dental visits.  -Health maintenance and immunizations reviewed. Please refer to Health maintenance section.  Return to care in 1 year for next preventative visit.     Subjective:  HPI:  He has no acute complaints today. Patient is here today for his annual physical.   See assessment / plan for status of chronic conditions.  Discussed the use of AI scribe software for clinical note transcription with the patient, who gave verbal consent to proceed.  History of Present Illness Troy [name] is a 74 year old male who presents for an annual physical exam.  His blood pressure was slightly elevated at 131 mmHg systolic, which he attributes to possible 'white coat hypertension'. He plans to monitor it at home.  He has arthritis in his hips and knees, which is stable. He uses meloxicam  as needed and finds it helpful.  He maintains an active lifestyle, participating in an adult fitness program three times a week, including weight and resistance training, and plays golf once a week. His diet includes plenty of fruits and vegetables.  His vaccinations, including pneumonia, shingles, and flu, are up to date. He had a colonoscopy a few years ago and is due for another in 2029.      12/05/2024    9:14 AM  Depression screen PHQ 2/9  Decreased Interest 0  Down, Depressed, Hopeless 0  PHQ - 2 Score 0    Health Maintenance Due  Topic Date Due   Medicare Annual Wellness (AWV)  01/24/2025     ROS: Per HPI, otherwise a complete review of systems was negative.   PMH:  The following were reviewed and entered/updated in epic: Past Medical History:  Diagnosis Date   BPH (benign prostatic hyperplasia)    Patient Active Problem List   Diagnosis Date Noted   Essential hypertension 03/24/2023   Degenerative disc disease, cervical 05/15/2020   Benign prostatic hyperplasia 05/22/2019   Erectile dysfunction 05/22/2019   Gout 01/08/2019   Degenerative disc disease, lumbar 12/11/2018   Nonallopathic lesion of sacral region 12/11/2018  Nonallopathic lesion of lumbar region 12/11/2018   Nonallopathic lesion of thoracic region 12/11/2018   Dyslipidemia 08/20/2014   Osteoarthritis 07/15/2009   Past Surgical History:  Procedure Laterality Date   COLONOSCOPY   11/24/2005   ST[facility]-TICS ONLY   EYE SURGERY Bilateral 11/23/2003   lasik   JOINT REPLACEMENT Left 11/22/2005   Lt THR - Dr Rubie   POLYPECTOMY  2017   TA x 2    Family History  Problem Relation Age of Onset   Heart disease Father    Cancer Brother        Lung Cancer   Colon cancer Neg Hx    Colon polyps Neg Hx    Esophageal cancer Neg Hx    Rectal cancer Neg Hx    Stomach cancer Neg Hx    Prostate cancer Neg Hx     Medications- reviewed and updated Current Outpatient Medications  Medication Sig Dispense Refill   amLODipine  (NORVASC ) 5 MG tablet TAKE 1 TABLET BY MOUTH DAILY 90 tablet 3   atorvastatin  (LIPITOR) 10 MG tablet TAKE 1 TABLET BY MOUTH EVERY OTHER DAY 45 tablet 3   finasteride  (PROSCAR ) 5 MG tablet TAKE 1 TABLET BY MOUTH DAILY 90 tablet 3   ibuprofen (ADVIL) 100 MG tablet Take by mouth.     meloxicam  (MOBIC ) 15 MG tablet Take 1 tablet (15 mg total) by mouth daily as needed for pain. 30 tablet 0   Multiple Vitamin (MULTIVITAMIN) capsule Take by mouth.     sildenafil  (REVATIO ) 20 MG tablet Take 2 to 5 tablets Daily as needed 90 tablet 9   No current facility-administered medications for this visit.    Allergies-reviewed and updated Allergies[1]  Social History   Socioeconomic History   Marital status: Married    Spouse name: Not on file   Number of children: Not on file   Years of education: Not on file   Highest education level: Bachelor's degree (e.g., BA, AB, BS)  Occupational History   Not on file  Tobacco Use   Smoking status: Never   Smokeless tobacco: Never  Vaping Use   Vaping status: Never Used  Substance and Sexual Activity   Alcohol use: Yes    Comment: rarely   Drug use: Never   Sexual activity: Yes  Other Topics Concern   Not on file  Social History Narrative   Not on file   Social Drivers of Health   Tobacco Use: Low Risk (12/05/2024)   Patient History    Smoking Tobacco Use: Never    Smokeless Tobacco Use: Never    Passive  Exposure: Not on file  Financial Resource Strain: Low Risk (01/21/2024)   Overall Financial Resource Strain (CARDIA)    Difficulty of Paying Living Expenses: Not hard at all  Food Insecurity: No Food Insecurity (01/21/2024)   Hunger Vital Sign    Worried About Running Out of Food in the Last Year: Never true    Ran Out of Food in the Last Year: Never true  Transportation Needs: No Transportation Needs (01/21/2024)   PRAPARE - Administrator, Civil Service (Medical): No    Lack of Transportation (Non-Medical): No  Physical Activity: Sufficiently Active (01/21/2024)   Exercise Vital Sign    Days of Exercise per Week: 6 days    Minutes of Exercise per Session: 60 min  Stress: No Stress Concern Present (01/21/2024)   [employer] of Occupational Health - Occupational Stress Questionnaire    Feeling of Stress :  Not at all  Social Connections: Moderately Integrated (01/21/2024)   Social Connection and Isolation Panel    Frequency of Communication with Friends and Family: Once a week    Frequency of Social Gatherings with Friends and Family: Once a week    Attends Religious Services: 1 to 4 times per year    Active Member of Clubs or Organizations: Yes    Attends Banker Meetings: More than 4 times per year    Marital Status: Married  Depression (PHQ2-9): Low Risk (12/05/2024)   Depression (PHQ2-9)    PHQ-2 Score: 0  Alcohol Screen: Low Risk (01/21/2024)   Alcohol Screen    Last Alcohol Screening Score (AUDIT): 2  Housing: Low Risk (01/21/2024)   Housing Stability Vital Sign    Unable to Pay for Housing in the Last Year: No    Number of Times Moved in the Last Year: 0    Homeless in the Last Year: No  Utilities: Not At Risk (01/25/2024)   A[employer] Utilities    Threatened with loss of utilities: No  Health Literacy: Adequate Health Literacy (01/25/2024)   B1300 Health Literacy    Frequency of need for help with medical instructions: Never        Objective:  Physical  Exam: BP (!) 150/80   Pulse 77   Temp 97.6 F (36.4 C) (Temporal)   Ht 5' 9 (1.753 m)   Wt 196 lb 9.6 oz (89.2 kg)   SpO2 96%   BMI 29.03 kg/m   Body mass index is 29.03 kg/m. Wt Readings from Last 3 Encounters:  12/05/24 196 lb 9.6 oz (89.2 kg)  10/31/24 200 lb 12.8 oz (91.1 kg)  10/01/24 199 lb (90.3 kg)   Gen: NAD, resting comfortably HEENT: TMs normal bilaterally. OP clear. No thyromegaly noted.  CV: RRR with no murmurs appreciated Pulm: NWOB, CTAB with no crackles, wheezes, or rhonchi GI: Normal bowel sounds present. Soft, Nontender, Nondistended. MSK: no edema, cyanosis, or clubbing noted Skin: warm, dry Neuro: CN2-12 grossly intact. Strength 5/5 in upper and lower extremities. Reflexes symmetric and intact bilaterally.  Psych: Normal affect and thought content     Janique Hoefer M. Kennyth, MD 12/05/2024 10:08 AM     [1] No Known Allergies  "

## 2024-12-05 NOTE — Patient Instructions (Signed)
 It was very nice to see you today!  VISIT SUMMARY: Today, you had your annual physical exam. Your blood pressure was slightly elevated, and you plan to monitor it at home. Your arthritis is stable, and you maintain an active lifestyle. Your vaccinations are up to date, and you are due for your next colonoscopy in 2029.  YOUR PLAN: ADULT WELLNESS VISIT: You maintain a well-managed weight and active lifestyle. Your vaccinations are current, and your next colonoscopy is due in 2029. -Blood work has been ordered to check your cholesterol, glucose, PSA, CBC, renal function, hepatic function, and thyroid  function. -Please schedule a follow-up appointment in one year.  ESSENTIAL HYPERTENSION: Your blood pressure is mildly elevated, possibly due to white coat hypertension. -Rechecked your blood pressure before you left. -Monitor your blood pressure at home and report if it is consistently elevated.  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 -Continue taking meloxicam  as needed for symptoms.  Return in about 1 year (around 12/05/2025) for Annual Physical.   Take care, Dr Kennyth  PLEASE NOTE:  If you had any lab tests, please let us  know if you have not heard back within a few days. You may see your results on mychart before we have a chance to review them but we will give you a call once they are reviewed by us .   If we ordered any referrals today, please let us  know if you have not heard from their office within the next week.   If you had any urgent prescriptions sent in today, please check with the pharmacy within an hour of our visit to make sure the prescription was transmitted appropriately.   Please try these tips to maintain a healthy lifestyle:  Eat at least 3 REAL meals and 1-2 snacks per day.  Aim for no more than 5 hours between eating.  If you eat breakfast, please do so within one hour of getting up.   Each meal should contain half  fruits/vegetables, one quarter protein, and one quarter carbs (no bigger than a computer mouse)  Cut down on sweet beverages. This includes juice, soda, and sweet tea.   Drink at least 1 glass of water with each meal and aim for at least 8 glasses per day  Exercise at least 150 minutes every week.     Preventive Care 56 Years and Older, Male Preventive care refers to lifestyle choices and visits with your health care provider that can promote health and wellness. Preventive care visits are also called wellness exams. What can I expect for my preventive care visit? Counseling During your preventive care visit, your health care provider may ask about your: Medical history, including: Past medical problems. Family medical history. History of falls. Current health, including: Emotional well-being. Home life and relationship well-being. Sexual activity. Memory and ability to understand (cognition). Lifestyle, including: Alcohol, nicotine or tobacco, and drug use. Access to firearms. Diet, exercise, and sleep habits. Work and work astronomer. Sunscreen use. Safety issues such as seatbelt and bike helmet use. Physical exam Your health care provider will check your: Height and weight. These may be used to calculate your BMI (body mass index). BMI is a measurement that tells if you are at a healthy weight. Waist circumference. This measures the distance around your waistline. This measurement also tells if you are at a healthy weight and may help predict your risk of certain diseases, such as type 2 diabetes and high blood pressure. Heart rate and blood  pressure. Body temperature. Skin for abnormal spots. What immunizations do I need?  Vaccines are usually given at various ages, according to a schedule. Your health care provider will recommend vaccines for you based on your age, medical history, and lifestyle or other factors, such as travel or where you work. What tests do I  need? Screening Your health care provider may recommend screening tests for certain conditions. This may include: Lipid and cholesterol levels. Diabetes screening. This is done by checking your blood sugar (glucose) after you have not eaten for a while (fasting). Hepatitis C test. Hepatitis B test. HIV (human immunodeficiency virus) test. STI (sexually transmitted infection) testing, if you are at risk. Lung cancer screening. Colorectal cancer screening. Prostate cancer screening. Abdominal aortic aneurysm (AAA) screening. You may need this if you are a current or former smoker. Talk with your health care provider about your test results, treatment options, and if necessary, the need for more tests. Follow these instructions at home: Eating and drinking  Eat a diet that includes fresh fruits and vegetables, whole grains, lean protein, and low-fat dairy products. Limit your intake of foods with high amounts of sugar, saturated fats, and salt. Take vitamin and mineral supplements as recommended by your health care provider. Do not drink alcohol if your health care provider tells you not to drink. If you drink alcohol: Limit how much you have to 0-2 drinks a day. Know how much alcohol is in your drink. In the [place] one drink equals one 12 oz bottle of beer (355 mL), one 5 oz glass of wine (148 mL), or one 1 oz glass of hard liquor (44 mL). Lifestyle Brush your teeth every morning and night with fluoride toothpaste. Floss one time each day. Exercise for at least 30 minutes 5 or more days each week. Do not use any products that contain nicotine or tobacco. These products include cigarettes, chewing tobacco, and vaping devices, such as e-cigarettes. If you need help quitting, ask your health care provider. Do not use drugs. If you are sexually active, practice safe sex. Use a condom or other form of protection to prevent STIs. Take aspirin only as told by your health care provider. Make sure  that you understand how much to take and what form to take. Work with your health care provider to find out whether it is safe and beneficial for you to take aspirin daily. Ask your health care provider if you need to take a cholesterol-lowering medicine (statin). Find healthy ways to manage stress, such as: Meditation, yoga, or listening to music. Journaling. Talking to a trusted person. Spending time with friends and family. Safety Always wear your seat belt while driving or riding in a vehicle. Do not drive: If you have been drinking alcohol. Do not ride with someone who has been drinking. When you are tired or distracted. While texting. If you have been using any mind-altering substances or drugs. Wear a helmet and other protective equipment during sports activities. If you have firearms in your house, make sure you follow all gun safety procedures. Minimize exposure to UV radiation to reduce your risk of skin cancer. What's next? Visit your health care provider once a year for an annual wellness visit. Ask your health care provider how often you should have your eyes and teeth checked. Stay up to date on all vaccines. This information is not intended to replace advice given to you by your health care provider. Make sure you discuss any questions you have with  your health care provider. Document Revised: 05/06/2021 Document Reviewed: 05/06/2021 Elsevier Patient Education  2024 [employer].

## 2024-12-07 ENCOUNTER — Ambulatory Visit: Payer: Self-pay | Admitting: Family Medicine

## 2024-12-07 NOTE — Progress Notes (Signed)
 Great news! Labs are all normal.  He should keep up the great work and we can recheck everything in a year or so.

## 2024-12-10 NOTE — Telephone Encounter (Signed)
 "  See patient note  "

## 2025-01-29 ENCOUNTER — Ambulatory Visit

## 2025-12-12 ENCOUNTER — Encounter: Admitting: Family Medicine
